# Patient Record
Sex: Female | Born: 1970
Health system: Southern US, Community
[De-identification: ages and names within clinical notes are randomized; demographics above are authoritative.]

## PROBLEM LIST (undated history)

## (undated) DIAGNOSIS — J45909 Unspecified asthma, uncomplicated: Secondary | ICD-10-CM

## (undated) DIAGNOSIS — E119 Type 2 diabetes mellitus without complications: Secondary | ICD-10-CM

## (undated) DIAGNOSIS — I5189 Other ill-defined heart diseases: Secondary | ICD-10-CM

## (undated) DIAGNOSIS — N200 Calculus of kidney: Secondary | ICD-10-CM

## (undated) DIAGNOSIS — B019 Varicella without complication: Secondary | ICD-10-CM

## (undated) DIAGNOSIS — N938 Other specified abnormal uterine and vaginal bleeding: Secondary | ICD-10-CM

## (undated) DIAGNOSIS — D649 Anemia, unspecified: Secondary | ICD-10-CM

## (undated) DIAGNOSIS — I10 Essential (primary) hypertension: Secondary | ICD-10-CM

## (undated) HISTORY — PX: TUBAL LIGATION: SHX77

## (undated) HISTORY — PX: CHOLECYSTECTOMY: SHX55

## (undated) HISTORY — PX: UMBILICAL HERNIA REPAIR: SHX196

## (undated) HISTORY — PX: HERNIA REPAIR: SHX51

## (undated) HISTORY — DX: Varicella without complication: B01.9

## (undated) HISTORY — PX: ABDOMINAL HYSTERECTOMY: SHX81

## (undated) HISTORY — DX: Unspecified asthma, uncomplicated: J45.909

## (undated) HISTORY — DX: Calculus of kidney: N20.0

---

## 1997-12-21 ENCOUNTER — Emergency Department (HOSPITAL_COMMUNITY): Admission: EM | Admit: 1997-12-21 | Discharge: 1997-12-21 | Payer: Self-pay | Admitting: Emergency Medicine

## 1998-05-14 ENCOUNTER — Encounter: Payer: Self-pay | Admitting: Emergency Medicine

## 1998-05-14 ENCOUNTER — Emergency Department (HOSPITAL_COMMUNITY): Admission: EM | Admit: 1998-05-14 | Discharge: 1998-05-14 | Payer: Self-pay | Admitting: Emergency Medicine

## 1998-05-28 ENCOUNTER — Emergency Department (HOSPITAL_COMMUNITY): Admission: EM | Admit: 1998-05-28 | Discharge: 1998-05-28 | Payer: Self-pay | Admitting: Emergency Medicine

## 1998-10-08 ENCOUNTER — Emergency Department (HOSPITAL_COMMUNITY): Admission: EM | Admit: 1998-10-08 | Discharge: 1998-10-08 | Payer: Self-pay | Admitting: Emergency Medicine

## 1998-10-08 ENCOUNTER — Encounter: Payer: Self-pay | Admitting: Emergency Medicine

## 1999-04-08 ENCOUNTER — Emergency Department (HOSPITAL_COMMUNITY): Admission: EM | Admit: 1999-04-08 | Discharge: 1999-04-08 | Payer: Self-pay | Admitting: Emergency Medicine

## 1999-05-08 ENCOUNTER — Inpatient Hospital Stay (HOSPITAL_COMMUNITY): Admission: AD | Admit: 1999-05-08 | Discharge: 1999-05-08 | Payer: Self-pay | Admitting: Obstetrics & Gynecology

## 1999-08-11 ENCOUNTER — Emergency Department (HOSPITAL_COMMUNITY): Admission: EM | Admit: 1999-08-11 | Discharge: 1999-08-12 | Payer: Self-pay | Admitting: Emergency Medicine

## 2000-03-18 ENCOUNTER — Emergency Department (HOSPITAL_COMMUNITY): Admission: EM | Admit: 2000-03-18 | Discharge: 2000-03-18 | Payer: Self-pay | Admitting: *Deleted

## 2000-11-11 ENCOUNTER — Emergency Department (HOSPITAL_COMMUNITY): Admission: EM | Admit: 2000-11-11 | Discharge: 2000-11-11 | Payer: Self-pay | Admitting: Emergency Medicine

## 2000-11-11 ENCOUNTER — Encounter: Payer: Self-pay | Admitting: Emergency Medicine

## 2001-05-02 ENCOUNTER — Emergency Department (HOSPITAL_COMMUNITY): Admission: EM | Admit: 2001-05-02 | Discharge: 2001-05-02 | Payer: Self-pay | Admitting: Emergency Medicine

## 2001-05-03 ENCOUNTER — Emergency Department (HOSPITAL_COMMUNITY): Admission: EM | Admit: 2001-05-03 | Discharge: 2001-05-03 | Payer: Self-pay | Admitting: Emergency Medicine

## 2001-05-03 ENCOUNTER — Ambulatory Visit (HOSPITAL_COMMUNITY): Admission: RE | Admit: 2001-05-03 | Discharge: 2001-05-03 | Payer: Self-pay | Admitting: Emergency Medicine

## 2001-05-03 ENCOUNTER — Encounter: Payer: Self-pay | Admitting: Emergency Medicine

## 2001-05-10 ENCOUNTER — Encounter: Admission: RE | Admit: 2001-05-10 | Discharge: 2001-05-10 | Payer: Self-pay | Admitting: *Deleted

## 2001-05-18 ENCOUNTER — Encounter: Payer: Self-pay | Admitting: Obstetrics and Gynecology

## 2001-05-18 ENCOUNTER — Inpatient Hospital Stay (HOSPITAL_COMMUNITY): Admission: AD | Admit: 2001-05-18 | Discharge: 2001-05-18 | Payer: Self-pay | Admitting: Obstetrics and Gynecology

## 2001-06-02 ENCOUNTER — Encounter: Admission: RE | Admit: 2001-06-02 | Discharge: 2001-06-02 | Payer: Self-pay | Admitting: Obstetrics and Gynecology

## 2002-03-29 ENCOUNTER — Encounter: Payer: Self-pay | Admitting: Emergency Medicine

## 2002-03-29 ENCOUNTER — Emergency Department (HOSPITAL_COMMUNITY): Admission: EM | Admit: 2002-03-29 | Discharge: 2002-03-29 | Payer: Self-pay | Admitting: Emergency Medicine

## 2002-07-10 ENCOUNTER — Encounter: Payer: Self-pay | Admitting: *Deleted

## 2002-07-10 ENCOUNTER — Emergency Department (HOSPITAL_COMMUNITY): Admission: EM | Admit: 2002-07-10 | Discharge: 2002-07-11 | Payer: Self-pay | Admitting: Emergency Medicine

## 2002-07-24 ENCOUNTER — Encounter: Admission: RE | Admit: 2002-07-24 | Discharge: 2002-07-24 | Payer: Self-pay | Admitting: Internal Medicine

## 2002-11-08 ENCOUNTER — Encounter: Admission: RE | Admit: 2002-11-08 | Discharge: 2002-11-08 | Payer: Self-pay | Admitting: Internal Medicine

## 2003-06-14 ENCOUNTER — Emergency Department (HOSPITAL_COMMUNITY): Admission: EM | Admit: 2003-06-14 | Discharge: 2003-06-14 | Payer: Self-pay | Admitting: Emergency Medicine

## 2003-08-23 ENCOUNTER — Emergency Department (HOSPITAL_COMMUNITY): Admission: EM | Admit: 2003-08-23 | Discharge: 2003-08-23 | Payer: Self-pay | Admitting: Emergency Medicine

## 2003-08-29 ENCOUNTER — Emergency Department (HOSPITAL_COMMUNITY): Admission: EM | Admit: 2003-08-29 | Discharge: 2003-08-29 | Payer: Self-pay | Admitting: Family Medicine

## 2003-12-20 ENCOUNTER — Emergency Department (HOSPITAL_COMMUNITY): Admission: EM | Admit: 2003-12-20 | Discharge: 2003-12-20 | Payer: Self-pay | Admitting: Emergency Medicine

## 2004-03-26 ENCOUNTER — Emergency Department (HOSPITAL_COMMUNITY): Admission: EM | Admit: 2004-03-26 | Discharge: 2004-03-26 | Payer: Self-pay | Admitting: Emergency Medicine

## 2004-03-31 ENCOUNTER — Emergency Department (HOSPITAL_COMMUNITY): Admission: EM | Admit: 2004-03-31 | Discharge: 2004-03-31 | Payer: Self-pay | Admitting: Emergency Medicine

## 2004-09-08 ENCOUNTER — Emergency Department (HOSPITAL_COMMUNITY): Admission: EM | Admit: 2004-09-08 | Discharge: 2004-09-08 | Payer: Self-pay | Admitting: Emergency Medicine

## 2004-09-08 ENCOUNTER — Emergency Department (HOSPITAL_COMMUNITY): Admission: EM | Admit: 2004-09-08 | Discharge: 2004-09-09 | Payer: Self-pay | Admitting: *Deleted

## 2005-11-04 IMAGING — CR DG CHEST 2V
2 series · 2 of 2 positions shown · non-contrast
Comparison: none

CLINICAL DATA: Chest pain, shortness of breath. 
 CHEST - TWO VIEW:
 PA and lateral views of the chest reveal the heart, pulmonary vascularity and lung fields to be within normal limits.  There is some slight irregularity of the right hemidiaphragm thought to represent eventration.

[view not recorded (1 of 2)]
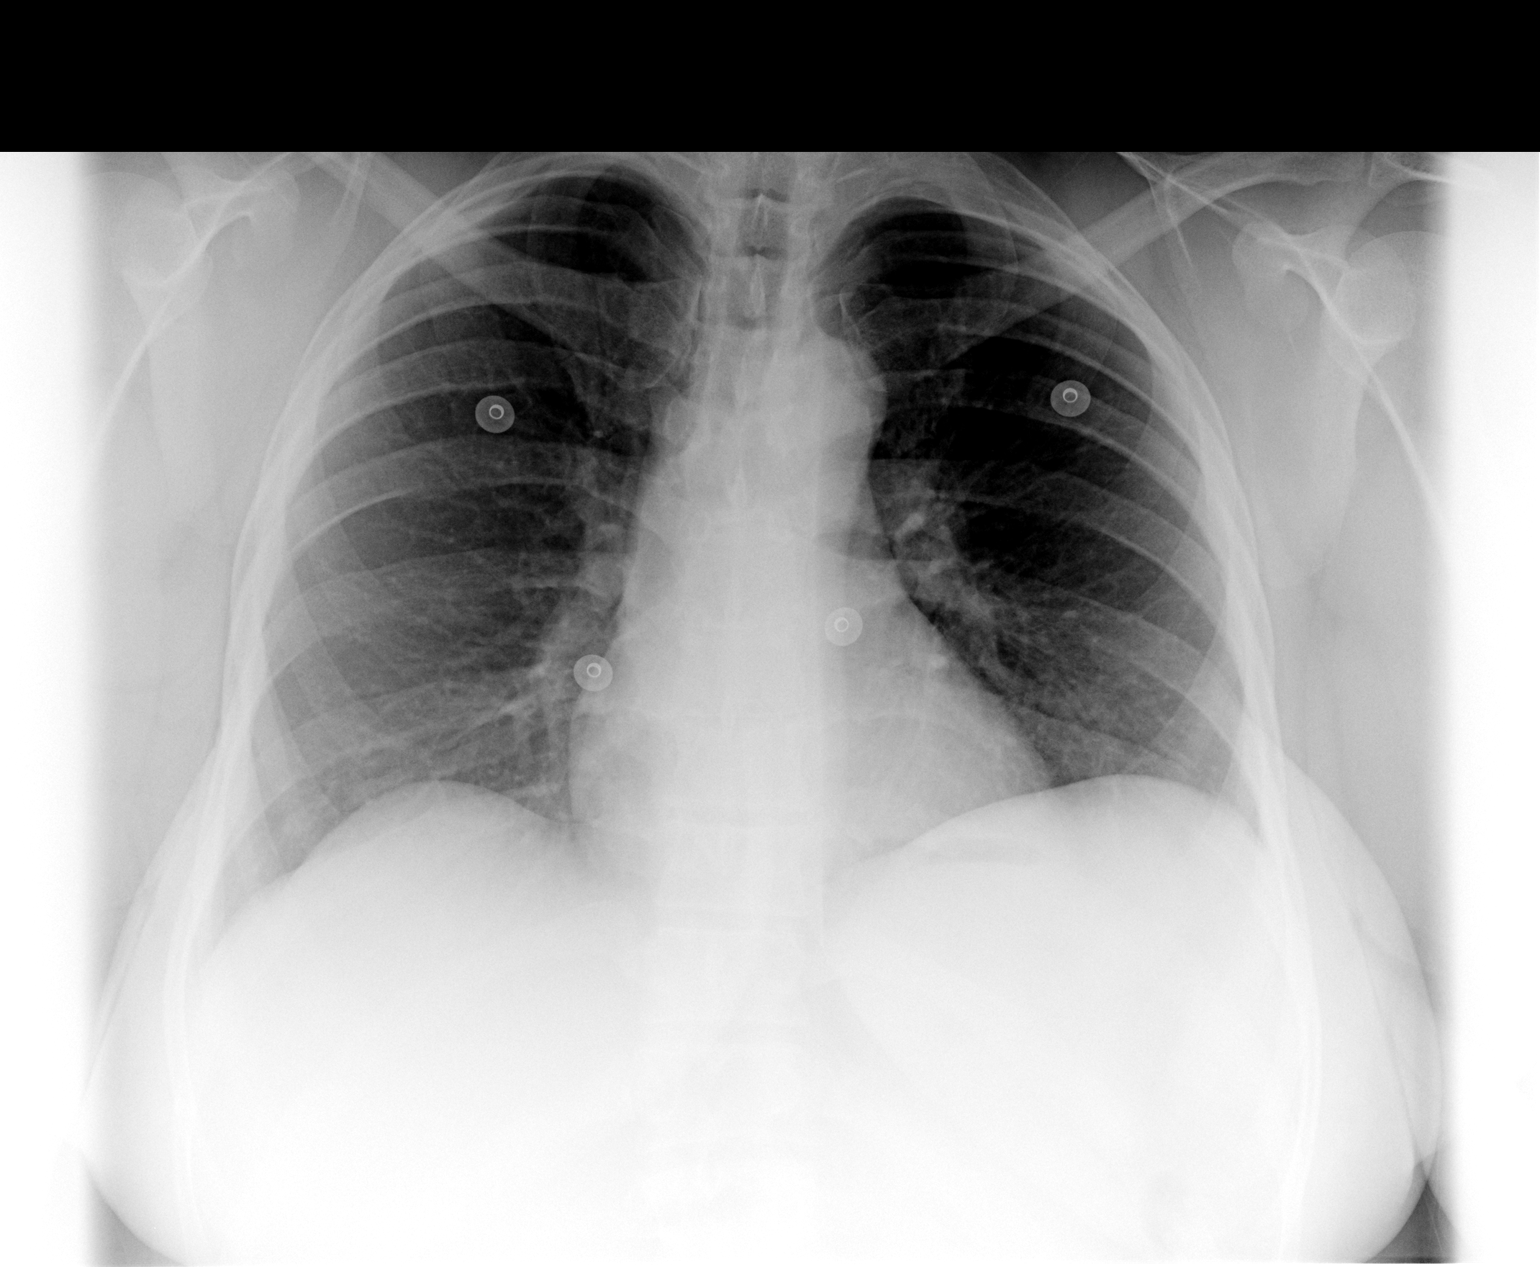

[view not recorded (2 of 2)]
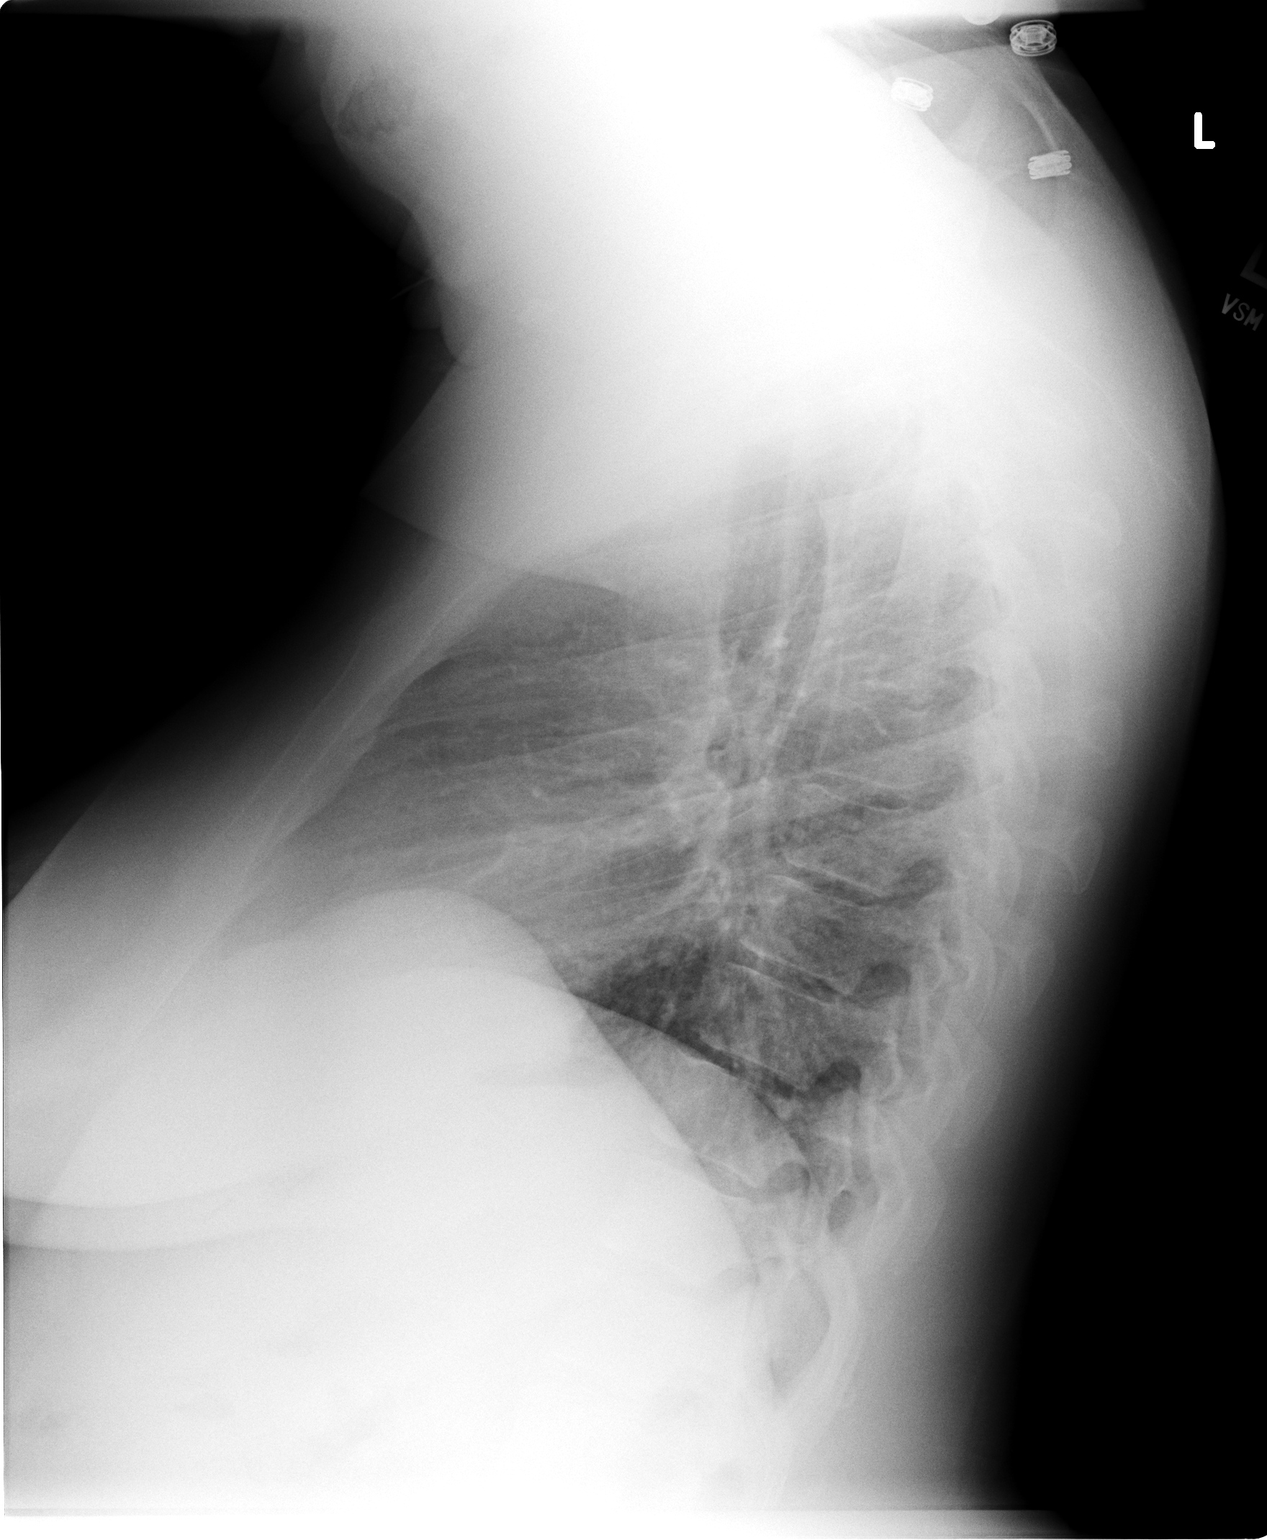

[2 of 2 positions shown; findings below may reference images not displayed]

IMPRESSION: Negative chest for active disease.

## 2005-12-08 ENCOUNTER — Emergency Department: Payer: Self-pay | Admitting: Emergency Medicine

## 2005-12-09 ENCOUNTER — Ambulatory Visit: Payer: Self-pay | Admitting: Emergency Medicine

## 2006-01-01 ENCOUNTER — Ambulatory Visit: Payer: Self-pay | Admitting: General Surgery

## 2006-12-29 ENCOUNTER — Emergency Department (HOSPITAL_COMMUNITY): Admission: EM | Admit: 2006-12-29 | Discharge: 2006-12-29 | Payer: Self-pay | Admitting: Emergency Medicine

## 2007-01-24 ENCOUNTER — Emergency Department (HOSPITAL_COMMUNITY): Admission: EM | Admit: 2007-01-24 | Discharge: 2007-01-24 | Payer: Self-pay | Admitting: Emergency Medicine

## 2007-01-25 ENCOUNTER — Inpatient Hospital Stay (HOSPITAL_COMMUNITY): Admission: AD | Admit: 2007-01-25 | Discharge: 2007-01-25 | Payer: Self-pay | Admitting: Obstetrics & Gynecology

## 2007-03-02 ENCOUNTER — Encounter (INDEPENDENT_AMBULATORY_CARE_PROVIDER_SITE_OTHER): Payer: Self-pay | Admitting: Gynecology

## 2007-03-02 ENCOUNTER — Other Ambulatory Visit: Admission: RE | Admit: 2007-03-02 | Discharge: 2007-03-02 | Payer: Self-pay | Admitting: Obstetrics and Gynecology

## 2007-03-02 ENCOUNTER — Ambulatory Visit: Payer: Self-pay | Admitting: Gynecology

## 2009-01-04 ENCOUNTER — Emergency Department (HOSPITAL_COMMUNITY): Admission: EM | Admit: 2009-01-04 | Discharge: 2009-01-04 | Payer: Self-pay | Admitting: Emergency Medicine

## 2009-06-20 ENCOUNTER — Emergency Department (HOSPITAL_COMMUNITY): Admission: EM | Admit: 2009-06-20 | Discharge: 2009-06-21 | Payer: Self-pay | Admitting: Emergency Medicine

## 2010-03-09 ENCOUNTER — Encounter: Payer: Self-pay | Admitting: *Deleted

## 2010-05-06 LAB — DIFFERENTIAL
Basophils Absolute: 0 10*3/uL (ref 0.0–0.1)
Basophils Relative: 1 % (ref 0–1)
Eosinophils Absolute: 0.2 10*3/uL (ref 0.0–0.7)
Eosinophils Relative: 4 % (ref 0–5)
Lymphocytes Relative: 39 % (ref 12–46)
Lymphs Abs: 1.9 10*3/uL (ref 0.7–4.0)
Monocytes Absolute: 0.3 10*3/uL (ref 0.1–1.0)
Monocytes Relative: 6 % (ref 3–12)
Neutro Abs: 2.5 10*3/uL (ref 1.7–7.7)
Neutrophils Relative %: 50 % (ref 43–77)

## 2010-05-06 LAB — COMPREHENSIVE METABOLIC PANEL
ALT: 33 U/L (ref 0–35)
AST: 26 U/L (ref 0–37)
Albumin: 3.8 g/dL (ref 3.5–5.2)
Alkaline Phosphatase: 90 U/L (ref 39–117)
BUN: 7 mg/dL (ref 6–23)
CO2: 24 mEq/L (ref 19–32)
Calcium: 10.9 mg/dL — ABNORMAL HIGH (ref 8.4–10.5)
Chloride: 108 mEq/L (ref 96–112)
Creatinine, Ser: 0.56 mg/dL (ref 0.4–1.2)
GFR calc Af Amer: >60 mL/min (ref >60)
GFR calc non Af Amer: >60 mL/min (ref >60)
Glucose, Bld: 94 mg/dL (ref 70–99)
Potassium: 4 mEq/L (ref 3.5–5.1)
Sodium: 135 mEq/L (ref 135–145)
Total Bilirubin: 0.5 mg/dL (ref 0.3–1.2)
Total Protein: 8.2 g/dL (ref 6.0–8.3)

## 2010-05-06 LAB — HEMOCCULT GUIAC POC 1CARD (OFFICE): Fecal Occult Bld: NEGATIVE

## 2010-05-06 LAB — URINALYSIS, ROUTINE W REFLEX MICROSCOPIC
Bilirubin Urine: NEGATIVE
Glucose, UA: NEGATIVE mg/dL
Hgb urine dipstick: NEGATIVE
Ketones, ur: NEGATIVE mg/dL
Nitrite: NEGATIVE
Protein, ur: NEGATIVE mg/dL
Specific Gravity, Urine: 1.021 (ref 1.005–1.030)
Urobilinogen, UA: 0.2 mg/dL (ref 0.0–1.0)
pH: 5.5 (ref 5.0–8.0)

## 2010-05-06 LAB — CBC
HCT: 28.9 % — ABNORMAL LOW (ref 36.0–46.0)
Hemoglobin: 9.4 g/dL — ABNORMAL LOW (ref 12.0–15.0)
MCHC: 32.4 g/dL (ref 30.0–36.0)
MCV: 72.2 fL — ABNORMAL LOW (ref 78.0–100.0)
Platelets: 391 10*3/uL (ref 150–400)
RBC: 4 MIL/uL (ref 3.87–5.11)
RDW: 18 % — ABNORMAL HIGH (ref 11.5–15.5)
WBC: 4.9 10*3/uL (ref 4.0–10.5)

## 2010-05-06 LAB — PREGNANCY, URINE: Preg Test, Ur: NEGATIVE

## 2010-05-06 LAB — LIPASE, BLOOD: Lipase: 21 U/L (ref 11–59)

## 2010-05-21 LAB — DIFFERENTIAL
Basophils Absolute: 0.1 10*3/uL (ref 0.0–0.1)
Basophils Relative: 3 % — ABNORMAL HIGH (ref 0–1)
Eosinophils Absolute: 0.1 10*3/uL (ref 0.0–0.7)
Eosinophils Relative: 4 % (ref 0–5)
Lymphocytes Relative: 42 % (ref 12–46)
Lymphs Abs: 1.5 10*3/uL (ref 0.7–4.0)
Monocytes Absolute: 0.2 10*3/uL (ref 0.1–1.0)
Monocytes Relative: 7 % (ref 3–12)
Neutro Abs: 1.7 10*3/uL (ref 1.7–7.7)
Neutrophils Relative %: 46 % (ref 43–77)

## 2010-05-21 LAB — POCT CARDIAC MARKERS
CKMB, poc: <1 ng/mL — ABNORMAL LOW (ref 1.0–8.0)
CKMB, poc: <1 ng/mL — ABNORMAL LOW (ref 1.0–8.0)
Myoglobin, poc: 58 ng/mL (ref 12–200)
Myoglobin, poc: 72 ng/mL (ref 12–200)
Troponin i, poc: <0.05 ng/mL (ref 0.00–0.09)
Troponin i, poc: <0.05 ng/mL (ref 0.00–0.09)

## 2010-05-21 LAB — COMPREHENSIVE METABOLIC PANEL
ALT: 21 U/L (ref 0–35)
AST: 25 U/L (ref 0–37)
Albumin: 3.6 g/dL (ref 3.5–5.2)
Alkaline Phosphatase: 87 U/L (ref 39–117)
BUN: 6 mg/dL (ref 6–23)
CO2: 23 mEq/L (ref 19–32)
Calcium: 10.2 mg/dL (ref 8.4–10.5)
Chloride: 107 mEq/L (ref 96–112)
Creatinine, Ser: 0.6 mg/dL (ref 0.4–1.2)
GFR calc Af Amer: >60 mL/min (ref >60)
GFR calc non Af Amer: >60 mL/min (ref >60)
Glucose, Bld: 119 mg/dL — ABNORMAL HIGH (ref 70–99)
Potassium: 3.8 mEq/L (ref 3.5–5.1)
Sodium: 134 mEq/L — ABNORMAL LOW (ref 135–145)
Total Bilirubin: 0.4 mg/dL (ref 0.3–1.2)
Total Protein: 7.7 g/dL (ref 6.0–8.3)

## 2010-05-21 LAB — CBC
HCT: 31.3 % — ABNORMAL LOW (ref 36.0–46.0)
Hemoglobin: 10.3 g/dL — ABNORMAL LOW (ref 12.0–15.0)
MCHC: 32.9 g/dL (ref 30.0–36.0)
MCV: 73.2 fL — ABNORMAL LOW (ref 78.0–100.0)
Platelets: 329 10*3/uL (ref 150–400)
RBC: 4.28 MIL/uL (ref 3.87–5.11)
RDW: 18.6 % — ABNORMAL HIGH (ref 11.5–15.5)
WBC: 3.6 10*3/uL — ABNORMAL LOW (ref 4.0–10.5)

## 2010-05-21 LAB — LIPASE, BLOOD: Lipase: 16 U/L (ref 11–59)

## 2010-05-21 LAB — D-DIMER, QUANTITATIVE: D-Dimer, Quant: 0.23 ug/mL-FEU (ref 0.00–0.48)

## 2010-07-01 NOTE — Group Therapy Note (Signed)
Carrie Neal NO.:  [id number]   MEDICAL RECORD NO.:  [id number]          PATIENT TYPE:  WOC   LOCATION:  [facility]                   FACILITY:  [facility]   PHYSICIAN:  Ginger Carne, MD DATE OF BIRTH:  [birth year]   DATE OF SERVICE:  03/02/2007                                  CLINIC NOTE   CHIEF COMPLAINT:  Prolonged vaginal bleeding.   HISTORY OF PRESENT ILLNESS:  This is a 40 year old female who has been  seen in the MAU on at least 2 occasions for dysfunctional uterine  bleeding.  She states she has had normal periods her entire life until  August, and then she skipped 2 months, then she bled all of November.  In December she was given some kind of little pill for bleeding and  then bled more, including with clots.  She was seen again in December in  the MAU for this bleeding, was found to have a hemoglobin of 7.5.  Transfusion was discussed, however, the patient was having some heart  palpitations so they held off on the transfusion and gave her IV fluids  which helped.  They also gave her some kind of oral contraceptives,  although the patient is not sure of the name of them.  The bleeding  eventually stopped a couple of weeks ago in January.  She still has some  heart racing on occasion.  The history is difficult to elicit as the  patient is unsure of what pills and exactly when and how many she took.  The patient denies having abnormal Pap smears in the past.   PAST MEDICAL HISTORY:  The patient has asthma checked.   FAMILY HISTORY:  Grandparents and cousins with diabetes.   GYN HISTORY:  Normal periods throughout her life until August of 2008.   SURGICAL HISTORY:  She had her bladder removed in 2007.  It is unclear  what exactly this means as no Foley is in place.   REVIEW OF SYSTEMS:  See HPI as well as positive for hot flashes,  positive for weight gain, headaches and dizzy spells.   PHYSICAL EXAMINATION:  Temp 97.4, pulse 102, blood  pressure 127/82,  weight is 255.5, respiratory rate 24.  GENERAL EXAM:  Not in acute distress.  ABDOMEN:  Soft, nontender.  EXTREMITIES:  No edema and 2+ pulses.  GENITOURINARY EXAM:  No lesions noted on the cervix.  No bleeding noted  at this time.  No masses palpated on bimanual exam.   PROCEDURE NOTE:  Endometrial biopsy was obtained.  Tenaculum was placed  at the 1 and 11 o'clock positions.  The endometrial __________ was  inserted without complications and a small sampling was obtained to be  sent for biopsy.  Dr. Mia Creek was present for this procedure.  A Pap  smear was also obtained prior to this endometrial biopsy.   ASSESSMENT AND PLAN:  A 40 year old female with dysfunctional uterine  bleeding.  1. Dysfunctional uterine bleeding:  The use of contraceptives have      somewhat clouded the picture; however, we will go ahead and get a  transvaginal ultrasound to evaluate the endometrial stripe.  We did      obtain an endometrial biopsy which we will send to pathology.  We      also obtained a Pap smear.  This could be perimenopausal bleeding;      however, we must rule out malignancy and other possible causes such      as fibroids which the ultrasound will pick up.  The patient will      followup in 2 to 3 weeks.  Will check a CBC and TSH.  2. Anemia:  We will check a CBC as the patient has had profound anemia      recently and she endures some symptoms that may be caused by      profound anemia.  Also obtain a TSH.      Johney [place], MD    ______________________________  Ginger Carne, MD    JT/MEDQ  D:  03/02/2007  T:  03/02/2007  Job:  [id number]

## 2010-07-04 NOTE — Consult Note (Signed)
. [facility]  Patient:    MADELYN, TLATELPA Visit Number: [id number] MRN: [id number]          Service Type: OBS Location: [facility] Attending Physician:  Amada Kingfisher. Proc. Date: 06/02/01 Admit Date:  05/18/2001 Discharge Date: 05/18/2001                            Consultation Report  HISTORY OF PRESENT ILLNESS: The patient is a 40 year old Gravida 6, Para 6, 0-0-6, who had an episode of amenorrhea with her last menstrual period being March 25, 2001. She had a sonogram and they thought that saw a gestational sac, however, now thought to be a small polyp. She was not having any symptoms. This was thought to be pregnancy. Beta HCG was negative and the patient had a spontaneous period but somewhat heavy on May 21, 2001. The patients main complaint has been upper abdominal bloating and swelling since January. She denies nausea, vomiting, diarrhea, or constipation but she says almost every day she feels lumps and swelling in the upper abdomen and it makes her very uncomfortable. Pelvic exam coupled with the sonogram is normal.  HOSPITAL COURSE: Probably irritable colon, spastic colitis. and the patient is being referred to internal medicine. In the interim, will give the patient some activated charcoal tablets to see if this may help alleviate the gas.  IMPRESSION: Abdominal bloating. Attending Physician:  Amada Kingfisher. DD:  06/02/01 TD:  06/03/01 Job: [id number]

## 2010-11-24 LAB — URINALYSIS, ROUTINE W REFLEX MICROSCOPIC
Glucose, UA: NEGATIVE
Leukocytes, UA: NEGATIVE
Specific Gravity, Urine: 1.035 — ABNORMAL HIGH
Urobilinogen, UA: 1

## 2010-11-24 LAB — CBC
HCT: 23.4 — ABNORMAL LOW
Hemoglobin: 7.6 — CL
MCHC: 31.9
RBC: 3.19 — ABNORMAL LOW
RDW: 19.1 — ABNORMAL HIGH
WBC: 5.1

## 2010-11-24 LAB — WET PREP, GENITAL
Clue Cells Wet Prep HPF POC: NONE SEEN
Trich, Wet Prep: NONE SEEN
Yeast Wet Prep HPF POC: NONE SEEN

## 2010-11-24 LAB — URINE MICROSCOPIC-ADD ON

## 2010-11-24 LAB — POCT PREGNANCY, URINE
Operator id: 294501
Preg Test, Ur: NEGATIVE

## 2010-11-25 LAB — BASIC METABOLIC PANEL
BUN: 3 — ABNORMAL LOW
CO2: 26
Glucose, Bld: 100 — ABNORMAL HIGH
Potassium: 3.6
Sodium: 137

## 2010-11-25 LAB — DIFFERENTIAL
Basophils Absolute: 0
Eosinophils Absolute: 0.1 — ABNORMAL LOW
Lymphs Abs: 1.1
Monocytes Absolute: 0.3
Neutro Abs: 2.3

## 2010-11-25 LAB — WET PREP, GENITAL
Clue Cells Wet Prep HPF POC: NONE SEEN
WBC, Wet Prep HPF POC: NONE SEEN

## 2010-11-25 LAB — CBC
HCT: 24.9 — ABNORMAL LOW
Hemoglobin: 8.3 — ABNORMAL LOW
MCHC: 33.3
MCV: 73.6 — ABNORMAL LOW
Platelets: 377
RDW: 20.9 — ABNORMAL HIGH

## 2010-11-25 LAB — RPR: RPR Ser Ql: NONREACTIVE

## 2011-06-15 ENCOUNTER — Emergency Department (HOSPITAL_COMMUNITY)
Admission: EM | Admit: 2011-06-15 | Discharge: 2011-06-15 | Disposition: A | Discharge status: Home / Self Care | Payer: Self-pay | Attending: Emergency Medicine | Admitting: Emergency Medicine

## 2011-06-15 ENCOUNTER — Encounter (HOSPITAL_COMMUNITY): Payer: Self-pay | Admitting: *Deleted

## 2011-06-15 DIAGNOSIS — Z9089 Acquired absence of other organs: Secondary | ICD-10-CM | POA: Insufficient documentation

## 2011-06-15 DIAGNOSIS — R109 Unspecified abdominal pain: Secondary | ICD-10-CM | POA: Insufficient documentation

## 2011-06-15 DIAGNOSIS — K2990 Gastroduodenitis, unspecified, without bleeding: Secondary | ICD-10-CM | POA: Insufficient documentation

## 2011-06-15 DIAGNOSIS — R10816 Epigastric abdominal tenderness: Secondary | ICD-10-CM | POA: Insufficient documentation

## 2011-06-15 DIAGNOSIS — K2970 Gastritis, unspecified, without bleeding: Secondary | ICD-10-CM | POA: Insufficient documentation

## 2011-06-15 HISTORY — DX: Essential (primary) hypertension: I10

## 2011-06-15 LAB — URINALYSIS, ROUTINE W REFLEX MICROSCOPIC
Bilirubin Urine: NEGATIVE
Glucose, UA: NEGATIVE mg/dL
Ketones, ur: NEGATIVE mg/dL
pH: 5.5 (ref 5.0–8.0)

## 2011-06-15 LAB — DIFFERENTIAL
Basophils Absolute: 0 10*3/uL (ref 0.0–0.1)
Basophils Relative: 0 % (ref 0–1)
Lymphocytes Relative: 45 % (ref 12–46)
Monocytes Relative: 6 % (ref 3–12)
Neutro Abs: 1.7 10*3/uL (ref 1.7–7.7)
Neutrophils Relative %: 47 % (ref 43–77)

## 2011-06-15 LAB — COMPREHENSIVE METABOLIC PANEL
AST: 16 U/L (ref 0–37)
Albumin: 3.6 g/dL (ref 3.5–5.2)
Alkaline Phosphatase: 100 U/L (ref 39–117)
BUN: 9 mg/dL (ref 6–23)
Chloride: 105 mEq/L (ref 96–112)
Potassium: 3.8 mEq/L (ref 3.5–5.1)
Total Bilirubin: 0.3 mg/dL (ref 0.3–1.2)

## 2011-06-15 LAB — CBC
Hemoglobin: 9.5 g/dL — ABNORMAL LOW (ref 12.0–15.0)
MCHC: 32 g/dL (ref 30.0–36.0)
RBC: 4 MIL/uL (ref 3.87–5.11)
WBC: 3.7 10*3/uL — ABNORMAL LOW (ref 4.0–10.5)

## 2011-06-15 MED ORDER — GI COCKTAIL ~~LOC~~
30.0000 mL | Freq: Once | ORAL | Status: AC
  Administered 2011-06-15: 30 mL via ORAL
  Filled 2011-06-15: qty 30

## 2011-06-15 NOTE — ED Provider Notes (Signed)
History     CSN: [id number]  Arrival date & time 06/15/11  8469   First MD Initiated Contact with Patient 06/15/11 1129      Chief Complaint  Patient presents with  . Abdominal Pain    (Consider location/radiation/quality/duration/timing/severity/associated sxs/prior treatment) HPI Comments: History of Lap Chole about 3 years ago.  Patient is a 41 y.o. female presenting with abdominal pain. The history is provided by the patient.  Abdominal Pain The primary symptoms of the illness include abdominal pain and nausea. The primary symptoms of the illness do not include fever, fatigue, vomiting, diarrhea or dysuria. Episode onset: 3 weeks ago. The onset of the illness was gradual. The problem has been gradually worsening.  The illness is associated with eating (drinking). The patient states that she believes she is currently not pregnant. The patient has not had a change in bowel habit. Symptoms associated with the illness do not include chills.    Past Medical History  Diagnosis Date  . Hypertension     Past Surgical History  Procedure Date  . Cholecystectomy   . Tubal ligation   . Umbilical hernia repair     as child    No family history on file.  History  Substance Use Topics  . Smoking status: Never Smoker   . Smokeless tobacco: Not on file  . Alcohol Use: No    OB History    Grav Para Term Preterm Abortions TAB SAB Ect Mult Living                  Review of Systems  Constitutional: Negative for fever, chills and fatigue.  Gastrointestinal: Positive for nausea and abdominal pain. Negative for vomiting and diarrhea.  Genitourinary: Negative for dysuria.  All other systems reviewed and are negative.    Allergies  Review of patient's allergies indicates no known allergies.  Home Medications   Current Outpatient Rx  Name Route Sig Dispense Refill  . IBUPROFEN 200 MG PO TABS Oral Take 400-800 mg by mouth every 6 (six) hours as needed. For pain    .  LISINOPRIL 10 MG PO TABS Oral Take 10 mg by mouth daily.      BP 124/83  Pulse 92  Temp(Src) 97.5 F (36.4 C) (Oral)  Resp 16  SpO2 96%  Physical Exam  Nursing note and vitals reviewed. Constitutional: She is oriented to person, place, and time. She appears well-developed and well-nourished. No distress.  HENT:  Head: Normocephalic and atraumatic.  Neck: Normal range of motion. Neck supple.  Cardiovascular: Normal rate and regular rhythm.  Exam reveals no gallop and no friction rub.   No murmur heard. Pulmonary/Chest: Effort normal and breath sounds normal. No respiratory distress. She has no wheezes.  Abdominal: Soft. Bowel sounds are normal. She exhibits no distension.       There is mild ttp in the epigastric region.  No rebound or guarding.    Musculoskeletal: Normal range of motion.  Neurological: She is alert and oriented to person, place, and time.  Skin: Skin is warm and dry. She is not diaphoretic.    ED Course  Procedures (including critical care time)   Labs Reviewed  CBC  DIFFERENTIAL  COMPREHENSIVE METABOLIC PANEL  LIPASE, BLOOD  URINALYSIS, ROUTINE W REFLEX MICROSCOPIC  PREGNANCY, URINE   No results found.   No diagnosis found.    MDM  The labs look okay.  I suspect this is gastritis as she is post-cholecystectomy.  Will treat with prilosec,  to return prn.          Geoffery Lyons, MD 06/15/11 1250

## 2011-06-15 NOTE — ED Notes (Signed)
Pt c/o lower abdominal pain x 3 weeks, also lower back pain. Denying any urinary symptoms. Last bowel movement today. Denying any n/v/d. Speaking in clear, concise sentences. Ambulated to restroom without difficulty.

## 2011-06-15 NOTE — Discharge Instructions (Signed)
Prilosec 20 mg twice daily for the next 2 weeks, once daily thereafter.  Multivitamin with iron once daily.     Abdominal Pain Abdominal pain can be caused by many things. Your caregiver decides the seriousness of your pain by an examination and possibly blood tests and X-rays. Many cases can be observed and treated at home. Most abdominal pain is not caused by a disease and will probably improve without treatment. However, in many cases, more time must pass before a clear cause of the pain can be found. Before that point, it may not be known if you need more testing, or if hospitalization or surgery is needed. HOME CARE INSTRUCTIONS   Do not take laxatives unless directed by your caregiver.   Take pain medicine only as directed by your caregiver.   Only take over-the-counter or prescription medicines for pain, discomfort, or fever as directed by your caregiver.   Try a clear liquid diet (broth, tea, or water) for as long as directed by your caregiver. Slowly move to a bland diet as tolerated.  SEEK IMMEDIATE MEDICAL CARE IF:   The pain does not go away.   You have a fever.   You keep throwing up (vomiting).   The pain is felt only in portions of the abdomen. Pain in the right side could possibly be appendicitis. In an adult, pain in the left lower portion of the abdomen could be colitis or diverticulitis.   You pass bloody or black tarry stools.  MAKE SURE YOU:   Understand these instructions.   Will watch your condition.   Will get help right away if you are not doing well or get worse.  Document Released: 11/12/2004 Document Revised: 01/22/2011 Document Reviewed: 09/21/2007 [facility] Patient Information 2012 [place], [place].

## 2011-06-15 NOTE — ED Notes (Signed)
Pt c/o abd pain x 3 weeks.  Pain increases after eating, epigastric and R flank pain.

## 2012-02-20 ENCOUNTER — Emergency Department (HOSPITAL_COMMUNITY): Payer: Self-pay

## 2012-02-20 ENCOUNTER — Emergency Department (HOSPITAL_COMMUNITY)
Admission: EM | Admit: 2012-02-20 | Discharge: 2012-02-20 | Disposition: A | Discharge status: Home / Self Care | Payer: Self-pay | Attending: Emergency Medicine | Admitting: Emergency Medicine

## 2012-02-20 ENCOUNTER — Encounter (HOSPITAL_COMMUNITY): Payer: Self-pay | Admitting: *Deleted

## 2012-02-20 DIAGNOSIS — M2550 Pain in unspecified joint: Secondary | ICD-10-CM | POA: Insufficient documentation

## 2012-02-20 DIAGNOSIS — M722 Plantar fascial fibromatosis: Secondary | ICD-10-CM | POA: Insufficient documentation

## 2012-02-20 DIAGNOSIS — J3489 Other specified disorders of nose and nasal sinuses: Secondary | ICD-10-CM | POA: Insufficient documentation

## 2012-02-20 DIAGNOSIS — I10 Essential (primary) hypertension: Secondary | ICD-10-CM | POA: Insufficient documentation

## 2012-02-20 DIAGNOSIS — J069 Acute upper respiratory infection, unspecified: Secondary | ICD-10-CM | POA: Insufficient documentation

## 2012-02-20 MED ORDER — IBUPROFEN 400 MG PO TABS
600.0000 mg | ORAL_TABLET | Freq: Once | ORAL | Status: AC
  Administered 2012-02-20: 600 mg via ORAL
  Filled 2012-02-20: qty 1

## 2012-02-20 MED ORDER — FEXOFENADINE-PSEUDOEPHED ER 60-120 MG PO TB12: 1.0000 | ORAL_TABLET | Freq: Two times a day (BID) | ORAL | Status: DC

## 2012-02-20 MED ORDER — NAPROXEN 500 MG PO TABS: 500.0000 mg | ORAL_TABLET | Freq: Two times a day (BID) | ORAL | Status: DC | PRN

## 2012-02-20 MED ORDER — OXYCODONE-ACETAMINOPHEN 5-325 MG PO TABS
2.0000 | ORAL_TABLET | Freq: Once | ORAL | Status: AC
  Administered 2012-02-20: 2 via ORAL
  Filled 2012-02-20: qty 2

## 2012-02-20 NOTE — ED Provider Notes (Signed)
History   This chart was scribed for Raeford Razor, MD by Sofie Rower, ED Scribe. The patient was seen in room TR11C/TR11C and the patient's care was started at 5:40PM.     CSN: [id number]  Arrival date & time 02/20/12  1533   First MD Initiated Contact with Patient 02/20/12 1740      Chief Complaint  Patient presents with  . Foot Pain  . Nasal Congestion    (Consider location/radiation/quality/duration/timing/severity/associated sxs/prior treatment) Patient is a 42 y.o. female presenting with lower extremity pain. The history is provided by the patient. No language interpreter was used.  Foot Pain This is a new problem. The current episode started more than 1 week ago. The problem occurs constantly. The problem has been gradually worsening. The symptoms are aggravated by walking. Nothing relieves the symptoms. Treatments tried: ibprofen. The treatment provided no relief.    Carrie Neal is a 42 y.o. female , with a hx of hypertension, who presents to the Emergency Department complaining of gradual, progressively worsening, foot pain located at the right foot, onset two weeks ago. The pt reports she has been experiencing a painful sensation located at the bottom of her right foot, while walking, over the past two weeks. The foot pain started off as a mild nuisance , however, it has become increasingly severe, promoting the pt's concern and desire to seek medical evaluation at [facility] this evening. The pt has taken ibuprofen which does not provide relief of the foot pain. Modifying factors include weight bearing and ambulation which intensifies the foot pain. The pt also presents to the Emergency Department Complaining of moderate, progressively worsening nasal congestion, onset today (02/20/12). Associated symptoms include rhinorrhea. The pt also believes she may be coming down with a sinus infection at present time.   The pt denies experiencing any injury which may have prompted her foot pain.    The pt does not smoke or drink alcohol.   PCP is Dr. Sherlyn Lick.    Past Medical History  Diagnosis Date  . Hypertension     Past Surgical History  Procedure Date  . Cholecystectomy   . Tubal ligation   . Umbilical hernia repair     as child    No family history on file.  History  Substance Use Topics  . Smoking status: Never Smoker   . Smokeless tobacco: Not on file  . Alcohol Use: No    OB History    Grav Para Term Preterm Abortions TAB SAB Ect Mult Living                  Review of Systems  HENT: Positive for rhinorrhea and sinus pressure.   Musculoskeletal: Positive for arthralgias.  All other systems reviewed and are negative.    Allergies  Review of patient's allergies indicates no known allergies.  Home Medications  No current outpatient prescriptions on file.  BP 139/80  Pulse 72  Temp 98.6 F (37 C) (Oral)  SpO2 95%  Physical Exam  Nursing note and vitals reviewed. Constitutional: She appears well-developed and well-nourished. No distress.  HENT:  Head: Normocephalic and atraumatic.  Eyes: Conjunctivae normal are normal. Right eye exhibits no discharge. Left eye exhibits no discharge.  Neck: Neck supple.  Cardiovascular: Normal rate, regular rhythm and normal heart sounds.  Exam reveals no gallop and no friction rub.   No murmur heard. Pulmonary/Chest: Effort normal and breath sounds normal. No respiratory distress.  Abdominal: Soft. She exhibits no distension. There is  no tenderness.  Musculoskeletal: She exhibits no edema and no tenderness.       Left foot grossly normal in appearance. Diffuse tenderness along the plantar surface of the right foot. From calcaneus to ball of right foot. No concerning skin lesions. Foot warm and well perfused.   Neurological: She is alert.  Skin: Skin is warm and dry.  Psychiatric: She has a normal mood and affect. Her behavior is normal. Thought content normal.    ED Course  Procedures (including  critical care time)  DIAGNOSTIC STUDIES: Oxygen Saturation is 95% on room air, normal by my interpretation.    COORDINATION OF CARE:   5:50 PM- Treatment plan discussed with patient. Pt agrees with treatment.     Labs Reviewed - No data to display Dg Foot Complete Right  02/20/2012  *RADIOLOGY REPORT*  Clinical Data: Right heel pain for 3 weeks  RIGHT FOOT COMPLETE - 3+ VIEW  Comparison: None.  Findings: No fracture or dislocation.  No soft tissue abnormality. No radiopaque foreign body.  Minimal plantar calcaneal spurring.  IMPRESSION: No acute osseous abnormality.   Original Report Authenticated By: Christiana Pellant, M.D.      1. Plantar fasciitis   2. URI (upper respiratory infection)       MDM  42 year old female with atraumatic foot pain. Symptoms and exam consistent with plantar fasciitis. Plan systematic treatment. Or the referral for possible steroid injection. No evidence of infection. Doubt DVT.     I personally preformed the services scribed in my presence. The recorded information has been reviewed is accurate. Raeford Razor, MD.    Raeford Razor, MD 02/23/12 [phone]

## 2012-02-20 NOTE — ED Notes (Signed)
Bottom of rt. Foot painful; difficult to walk. Also, sinus infection. Green stuff coming out of nose. Ears pop when blowing nose.

## 2012-10-22 ENCOUNTER — Emergency Department (HOSPITAL_COMMUNITY): Payer: Self-pay

## 2012-10-22 ENCOUNTER — Emergency Department (HOSPITAL_COMMUNITY)
Admission: EM | Admit: 2012-10-22 | Discharge: 2012-10-22 | Disposition: A | Discharge status: Home / Self Care | Payer: Self-pay | Attending: Emergency Medicine | Admitting: Emergency Medicine

## 2012-10-22 ENCOUNTER — Encounter (HOSPITAL_COMMUNITY): Payer: Self-pay | Admitting: Emergency Medicine

## 2012-10-22 DIAGNOSIS — R071 Chest pain on breathing: Secondary | ICD-10-CM | POA: Insufficient documentation

## 2012-10-22 DIAGNOSIS — R0789 Other chest pain: Secondary | ICD-10-CM

## 2012-10-22 DIAGNOSIS — Z79899 Other long term (current) drug therapy: Secondary | ICD-10-CM | POA: Insufficient documentation

## 2012-10-22 DIAGNOSIS — I10 Essential (primary) hypertension: Secondary | ICD-10-CM | POA: Insufficient documentation

## 2012-10-22 MED ORDER — ACETAMINOPHEN 325 MG PO TABS
650.0000 mg | ORAL_TABLET | Freq: Once | ORAL | Status: DC
  Filled 2012-10-22: qty 2

## 2012-10-22 MED ORDER — KETOROLAC TROMETHAMINE 60 MG/2ML IM SOLN
60.0000 mg | Freq: Once | INTRAMUSCULAR | Status: AC
  Administered 2012-10-22: 60 mg via INTRAMUSCULAR
  Filled 2012-10-22: qty 2

## 2012-10-22 NOTE — ED Notes (Signed)
States started having sharp, throbbing pain over "knot" in left ribs-- nickel sized lump felt on lower rib cage left side

## 2012-10-22 NOTE — ED Provider Notes (Addendum)
CSN: [id number]     Arrival date & time 10/22/12  0810 History   First MD Initiated Contact with Patient 10/22/12 [phone]     Chief Complaint  Patient presents with  . knot on ribs    (Consider location/radiation/quality/duration/timing/severity/associated sxs/prior Treatment) HPI  42 year old female who presents today complaining of some left lateral chest wall pain that began yesterday. She denies any trauma. It is sharp and throbbing in nature. It waxes and wanes. She states it is worse with palpation and deep breaths. She denies dyspnea, cough, central chest pain, abdominal pain, nausea, or vomiting. She denies any injury or heavy lifting. She has not had similar events in the past. She has not done anything that has helped it. She states that she has not tried any medicines including ibuprofen and a sure that ibuprofen did not help it.  Past Medical History  Diagnosis Date  . Hypertension    Past Surgical History  Procedure Laterality Date  . Cholecystectomy    . Tubal ligation    . Umbilical hernia repair      as child   History reviewed. No pertinent family history. History  Substance Use Topics  . Smoking status: Never Smoker   . Smokeless tobacco: Not on file  . Alcohol Use: No   OB History   Grav Para Term Preterm Abortions TAB SAB Ect Mult Living                 Review of Systems  All other systems reviewed and are negative.    Allergies  Codeine  Home Medications   Current Outpatient Rx  Name  Route  Sig  Dispense  Refill  . fexofenadine-pseudoephedrine (ALLEGRA-D) 60-120 MG per tablet   Oral   Take 1 tablet by mouth daily.         [name] Kitchen ibuprofen (ADVIL,MOTRIN) 200 MG tablet   Oral   Take 600 mg by mouth every 6 (six) hours as needed for pain.          BP 133/53  Pulse 87  Temp(Src) 97.3 F (36.3 C) (Oral)  Resp 18  SpO2 98% Physical Exam  Nursing note and vitals reviewed. Constitutional: She is oriented to person, place, and time. She appears  well-developed and well-nourished.  Obese  HENT:  Head: Normocephalic and atraumatic.  Right Ear: External ear normal.  Left Ear: External ear normal.  Mouth/Throat: Oropharynx is clear and moist.  Eyes: Conjunctivae and EOM are normal. Pupils are equal, round, and reactive to light.  Neck: Normal range of motion. Neck supple.  Cardiovascular: Normal rate, regular rhythm, normal heart sounds and intact distal pulses.   Pulmonary/Chest: Effort normal and breath sounds normal. She exhibits tenderness.    Abdominal: Soft. Bowel sounds are normal.  No abdominal tenderness even with deep palpation in the left upper quadrant.  Musculoskeletal: Normal range of motion. She exhibits no edema and no tenderness.  Neurological: She is alert and oriented to person, place, and time. She has normal reflexes.  Skin: Skin is warm and dry.  Psychiatric: She has a normal mood and affect. Her behavior is normal. Judgment and thought content normal.    ED Course  Procedures (including critical care time) Labs Review Labs Reviewed - No data to display Imaging Review Dg Ribs Unilateral W/chest Left  10/22/2012   *RADIOLOGY REPORT*  Clinical Data: Left chest wall pain  LEFT RIBS AND CHEST - 3+ VIEW  Comparison: Prior chest x-Markela Wee 01/04/2009  Findings: The lungs are  clear.  Normal cardiac and mediastinal contours.  No pleural effusion or pneumothorax.  Surgical clips the right upper quadrant suggest prior cholecystectomy.  Visualized bowel gas pattern is unremarkable. No evidence of acute rib fracture.  No acute osseous abnormality.  IMPRESSION:  1.  No acute cardiopulmonary disease. 2.  No rib fracture identified.   Original Report Authenticated By: Malachy Moan, M.D.    MDM  No diagnosis found. This is a 42 year old female who appears to have chest wall pain although there is not appear to been a specific trigger. There is no evidence from history or physical exam that there is coronary artery disease  with this is inconsistent in anyway with coronary artery disease, pulmonary embolism, or abdominal pathology. She's given a shot of Toradol here and is advised to use nonsteroidals for pain. Chest x-Atiba Kimberlin with left rib detail does not show any rib or thoracic abnormality noted.  Hilario Quarry, MD 10/22/12 1005  Hilario Quarry, MD 10/22/12 1006

## 2012-10-22 NOTE — ED Notes (Signed)
Knot felt under left breast on lower rib cage. Painful on palpation

## 2013-12-17 DIAGNOSIS — N938 Other specified abnormal uterine and vaginal bleeding: Secondary | ICD-10-CM

## 2013-12-17 HISTORY — DX: Other specified abnormal uterine and vaginal bleeding: N93.8

## 2014-03-01 ENCOUNTER — Encounter (HOSPITAL_COMMUNITY): Payer: Self-pay | Admitting: Emergency Medicine

## 2014-03-01 ENCOUNTER — Emergency Department (HOSPITAL_COMMUNITY)
Admission: EM | Admit: 2014-03-01 | Discharge: 2014-03-01 | Disposition: A | Discharge status: Home / Self Care | Payer: BLUE CROSS/BLUE SHIELD | Attending: Emergency Medicine | Admitting: Emergency Medicine

## 2014-03-01 DIAGNOSIS — N920 Excessive and frequent menstruation with regular cycle: Secondary | ICD-10-CM | POA: Insufficient documentation

## 2014-03-01 DIAGNOSIS — I10 Essential (primary) hypertension: Secondary | ICD-10-CM | POA: Diagnosis not present

## 2014-03-01 DIAGNOSIS — D500 Iron deficiency anemia secondary to blood loss (chronic): Secondary | ICD-10-CM | POA: Diagnosis not present

## 2014-03-01 DIAGNOSIS — N921 Excessive and frequent menstruation with irregular cycle: Secondary | ICD-10-CM

## 2014-03-01 LAB — WET PREP, GENITAL
CLUE CELLS WET PREP: NONE SEEN
TRICH WET PREP: NONE SEEN
YEAST WET PREP: NONE SEEN

## 2014-03-01 LAB — CBC WITH DIFFERENTIAL/PLATELET
BASOS PCT: 0 % (ref 0–1)
Basophils Absolute: 0 10*3/uL (ref 0.0–0.1)
EOS ABS: 0.2 10*3/uL (ref 0.0–0.7)
EOS PCT: 3 % (ref 0–5)
HCT: 28.4 % — ABNORMAL LOW (ref 36.0–46.0)
Hemoglobin: 8.6 g/dL — ABNORMAL LOW (ref 12.0–15.0)
Lymphocytes Relative: 28 % (ref 12–46)
Lymphs Abs: 1.7 10*3/uL (ref 0.7–4.0)
MCH: 21.4 pg — ABNORMAL LOW (ref 26.0–34.0)
MCHC: 30.3 g/dL (ref 30.0–36.0)
MCV: 70.8 fL — AB (ref 78.0–100.0)
Monocytes Absolute: 0.4 10*3/uL (ref 0.1–1.0)
Monocytes Relative: 6 % (ref 3–12)
NEUTROS ABS: 3.9 10*3/uL (ref 1.7–7.7)
Neutrophils Relative %: 63 % (ref 43–77)
PLATELETS: 396 10*3/uL (ref 150–400)
RBC: 4.01 MIL/uL (ref 3.87–5.11)
RDW: 18.1 % — AB (ref 11.5–15.5)
WBC: 6.2 10*3/uL (ref 4.0–10.5)

## 2014-03-01 LAB — COMPREHENSIVE METABOLIC PANEL
ALBUMIN: 3.6 g/dL (ref 3.5–5.2)
ALK PHOS: 84 U/L (ref 39–117)
ALT: 23 U/L (ref 0–35)
AST: 24 U/L (ref 0–37)
Anion gap: 3 — ABNORMAL LOW (ref 5–15)
BUN: 5 mg/dL — ABNORMAL LOW (ref 6–23)
CO2: 29 mmol/L (ref 19–32)
CREATININE: 0.59 mg/dL (ref 0.50–1.10)
Calcium: 8.9 mg/dL (ref 8.4–10.5)
Chloride: 106 mEq/L (ref 96–112)
GFR calc non Af Amer: >90 mL/min (ref >90)
GLUCOSE: 131 mg/dL — AB (ref 70–99)
Potassium: 3.6 mmol/L (ref 3.5–5.1)
SODIUM: 138 mmol/L (ref 135–145)
TOTAL PROTEIN: 7.6 g/dL (ref 6.0–8.3)
Total Bilirubin: 0.3 mg/dL (ref 0.3–1.2)

## 2014-03-01 LAB — I-STAT BETA HCG BLOOD, ED (MC, WL, AP ONLY)

## 2014-03-01 LAB — SAMPLE TO BLOOD BANK

## 2014-03-01 MED ORDER — MEGESTROL ACETATE 40 MG PO TABS: ORAL_TABLET | ORAL | Status: DC

## 2014-03-01 MED ORDER — FERROUS SULFATE 325 (65 FE) MG PO TABS: 325.0000 mg | ORAL_TABLET | Freq: Three times a day (TID) | ORAL | Status: DC

## 2014-03-01 NOTE — ED Notes (Signed)
Pt c/o heavy vaginal bleeding x 2 weeks; pt sts some gen weakness and SOB with exertion and thinks that her hgb might be low

## 2014-03-01 NOTE — Discharge Instructions (Signed)
Please follow with your primary care doctor in the next 2 days for a check-up. They must obtain records for further management.   Do not hesitate to return to the Emergency Department for any new, worsening or concerning symptoms.    Abnormal Uterine Bleeding Abnormal uterine bleeding means bleeding from the vagina that is not your normal menstrual period. This can be:  Bleeding or spotting between periods.  Bleeding after sex (sexual intercourse).  Bleeding that is heavier or more than normal.  Periods that last longer than usual.  Bleeding after menopause. There are many problems that may cause this. Treatment will depend on the cause of the bleeding. Any kind of bleeding that is not normal should be reviewed by your doctor.  HOME CARE Watch your condition for any changes. These actions may lessen any discomfort you are having:  Do not use tampons or douches as told by your doctor.  Change your pads often. You should get regular pelvic exams and Pap tests. Keep all appointments for tests as told by your doctor. GET HELP IF:  You are bleeding for more than 1 week.  You feel dizzy at times. GET HELP RIGHT AWAY IF:   You pass out.  You have to change pads every 15 to 30 minutes.  You have belly pain.  You have a fever.  You become sweaty or weak.  You are passing large blood clots from the vagina.  You feel sick to your stomach (nauseous) and throw up (vomit). MAKE SURE YOU:  Understand these instructions.  Will watch your condition.  Will get help right away if you are not doing well or get worse. Document Released: 11/30/2008 Document Revised: 02/07/2013 Document Reviewed: 09/01/2012 [facility] Patient Information 2015 [place], [place]. This information is not intended to replace advice given to you by your health care provider. Make sure you discuss any questions you have with your health care provider.

## 2014-03-01 NOTE — ED Provider Notes (Signed)
CSN: [id number]     Arrival date & time 03/01/14  1346 History   First MD Initiated Contact with Patient 03/01/14 1530     Chief Complaint  Patient presents with  . Vaginal Bleeding     (Consider location/radiation/quality/duration/timing/severity/associated sxs/prior Treatment) HPI  Carrie Neal is a 44 y.o. female complaining of heavy menses which she has had for 2 weeks. States her cycles are regular. Cannot remember when the last. As before this one. She has 4 out of 10 midline lower abdominal pain which was alleviated by Motrin which she had this morning. She has used approximately 90 pads in the last 2 weeks. States there is passage of clots, dates that she feels lightheaded, short of breath more so than usual worsening over the course of 2 weeks. She denies any chest pain, palpitations, syncope, headache, cough. Patient has no OB/GYN, recently established primary care.   Past Medical History  Diagnosis Date  . Hypertension    Past Surgical History  Procedure Laterality Date  . Cholecystectomy    . Tubal ligation    . Umbilical hernia repair      as child   History reviewed. No pertinent family history. History  Substance Use Topics  . Smoking status: Never Smoker   . Smokeless tobacco: Not on file  . Alcohol Use: No   OB History    No data available     Review of Systems  10 systems reviewed and found to be negative, except as noted in the HPI.   Allergies  Codeine  Home Medications   Prior to Admission medications   Medication Sig Start Date End Date Taking? Authorizing Provider  ibuprofen (ADVIL,MOTRIN) 200 MG tablet Take 600 mg by mouth every 6 (six) hours as needed for pain.   Yes Historical Provider, MD  ferrous sulfate 325 (65 FE) MG tablet Take 1 tablet (325 mg total) by mouth 3 (three) times daily with meals. 03/01/14   Nichollas Perusse, PA-C  megestrol (MEGACE) 40 MG tablet Take one tab orally 3 times daily until bleeding begins to slow, then switch to  2 times daily, change to 1 time daily until you are evaluated by OB/GYN at [facility] 03/01/14   Geneal Huebert, PA-C   BP 115/80 mmHg  Pulse 89  Temp(Src) 98.5 F (36.9 C) (Oral)  Resp 18  Ht 5\' 7"  (1.702 m)  Wt 273 lb (123.832 kg)  BMI 42.75 kg/m2  SpO2 97% Physical Exam  Constitutional: She is oriented to person, place, and time. She appears well-developed and well-nourished. No distress.  Morbidly obese  HENT:  Head: Normocephalic and atraumatic.  Mouth/Throat: Oropharynx is clear and moist.  Eyes: Conjunctivae and EOM are normal. Pupils are equal, round, and reactive to light.  Neck: Normal range of motion.  Cardiovascular: Normal rate, regular rhythm and intact distal pulses.   Pulmonary/Chest: Effort normal and breath sounds normal. No stridor. No respiratory distress. She has no wheezes. She has no rales.  Abdominal: Soft. Bowel sounds are normal. She exhibits no distension and no mass. There is no tenderness. There is no rebound and no guarding.  Genitourinary:  Pelvic exam a chaperoned by nurse Carolyne Fiscal: No rashes or lesions, there is dark blood pooled in the posterior fourchette, no cervical motion or adnexal tenderness.  Musculoskeletal: Normal range of motion.  Neurological: She is alert and oriented to person, place, and time.  Psychiatric: She has a normal mood and affect.  Nursing note and vitals reviewed.  ED Course  Procedures (including critical care time) Labs Review Labs Reviewed  WET PREP, GENITAL - Abnormal; Notable for the following:    WBC, Wet Prep HPF POC FEW (*)    All other components within normal limits  CBC WITH DIFFERENTIAL - Abnormal; Notable for the following:    Hemoglobin 8.6 (*)    HCT 28.4 (*)    MCV 70.8 (*)    MCH 21.4 (*)    RDW 18.1 (*)    All other components within normal limits  COMPREHENSIVE METABOLIC PANEL - Abnormal; Notable for the following:    Glucose, Bld 131 (*)    BUN 5 (*)    Anion gap 3 (*)    All other  components within normal limits  I-STAT BETA HCG BLOOD, ED ([facility], [facility], [facility] ONLY)  SAMPLE TO BLOOD BANK  GC/CHLAMYDIA PROBE AMP ([place])    Imaging Review No results found.   EKG Interpretation None      MDM   Final diagnoses:  Menorrhagia with irregular cycle  Iron deficiency anemia due to chronic blood loss    Filed Vitals:   03/01/14 1357 03/01/14 1600 03/01/14 1849  BP: 153/88 105/70 115/80  Pulse: 104  89  Temp: 98.3 F (36.8 C)  98.5 F (36.9 C)  TempSrc: Oral  Oral  Resp: 20  18  Height: 5\' 7"  (1.702 m)    Weight: 273 lb (123.832 kg)    SpO2: 100%  97%    Carrie Neal is a pleasant 44 y.o. female presenting with irregular menses and heavy vaginal bleeding over the course of 2 weeks, she is feeling generally fatigued, lightheaded and having shortness of breath. Hemoglobin is 8.6, this is down 1 point from 2 years ago. MCV is mildly reduced at 70.8. Don't think there is a need for emergent transfusion at this time.  OB/GYN consult from Dr. Kennon Rounds appreciated: I have advised her of patient's clinical assessment and hemoglobin, she recommends starting Megace 40 mg 3 times a day until the bleeding slows then changing to twice a day and 1 per day until she was seen by OB/GYN. Agrees with no indication for emergent imaging at this time, I will set her up with outpatient ultrasound.  Will start her on iron supplementations as well.  Evaluation does not show pathology that would require ongoing emergent intervention or inpatient treatment. Pt is hemodynamically stable and mentating appropriately. Discussed findings and plan with patient/guardian, who agrees with care plan. All questions answered. Return precautions discussed and outpatient follow up given.   Discharge Medication List as of 03/01/2014  6:25 PM    START taking these medications   Details  ferrous sulfate 325 (65 FE) MG tablet Take 1 tablet (325 mg total) by mouth 3 (three) times daily with meals., Starting  03/01/2014, Until Discontinued, Print    megestrol (MEGACE) 40 MG tablet Take one tab orally 3 times daily until bleeding begins to slow, then switch to 2 times daily, change to 1 time daily until you are evaluated by OB/GYN at [facility] hospital, Print             Monico Blitz, PA-C 03/01/14 2032  Debby Freiberg, MD 03/02/14 [phone]

## 2014-03-02 LAB — GC/CHLAMYDIA PROBE AMP (~~LOC~~) NOT AT ARMC
CHLAMYDIA, DNA PROBE: NEGATIVE
Neisseria Gonorrhea: NEGATIVE

## 2014-03-16 ENCOUNTER — Other Ambulatory Visit: Payer: Self-pay | Admitting: Obstetrics & Gynecology

## 2014-03-16 ENCOUNTER — Other Ambulatory Visit (HOSPITAL_COMMUNITY)
Admission: RE | Admit: 2014-03-16 | Discharge: 2014-03-16 | Disposition: A | Discharge status: Home / Self Care | Payer: BLUE CROSS/BLUE SHIELD | Source: Ambulatory Visit | Attending: Obstetrics & Gynecology | Admitting: Obstetrics & Gynecology

## 2014-03-16 DIAGNOSIS — Z01411 Encounter for gynecological examination (general) (routine) with abnormal findings: Secondary | ICD-10-CM | POA: Diagnosis present

## 2014-03-16 DIAGNOSIS — Z1151 Encounter for screening for human papillomavirus (HPV): Secondary | ICD-10-CM | POA: Diagnosis present

## 2014-03-21 LAB — CYTOLOGY - PAP

## 2014-03-22 ENCOUNTER — Encounter (HOSPITAL_COMMUNITY): Payer: Self-pay | Admitting: General Practice

## 2014-03-22 ENCOUNTER — Observation Stay (HOSPITAL_COMMUNITY): Payer: BLUE CROSS/BLUE SHIELD

## 2014-03-22 ENCOUNTER — Observation Stay (HOSPITAL_COMMUNITY)
Admission: AD | Admit: 2014-03-22 | Discharge: 2014-03-23 | Disposition: A | Discharge status: Home / Self Care | Payer: BLUE CROSS/BLUE SHIELD | Source: Ambulatory Visit | Attending: Obstetrics & Gynecology | Admitting: Obstetrics & Gynecology

## 2014-03-22 DIAGNOSIS — Z79899 Other long term (current) drug therapy: Secondary | ICD-10-CM | POA: Diagnosis not present

## 2014-03-22 DIAGNOSIS — R000 Tachycardia, unspecified: Secondary | ICD-10-CM | POA: Diagnosis not present

## 2014-03-22 DIAGNOSIS — N921 Excessive and frequent menstruation with irregular cycle: Secondary | ICD-10-CM | POA: Diagnosis not present

## 2014-03-22 DIAGNOSIS — Z3202 Encounter for pregnancy test, result negative: Secondary | ICD-10-CM | POA: Insufficient documentation

## 2014-03-22 DIAGNOSIS — Z793 Long term (current) use of hormonal contraceptives: Secondary | ICD-10-CM | POA: Insufficient documentation

## 2014-03-22 DIAGNOSIS — R110 Nausea: Secondary | ICD-10-CM | POA: Insufficient documentation

## 2014-03-22 DIAGNOSIS — R42 Dizziness and giddiness: Secondary | ICD-10-CM | POA: Diagnosis not present

## 2014-03-22 DIAGNOSIS — N938 Other specified abnormal uterine and vaginal bleeding: Secondary | ICD-10-CM | POA: Diagnosis present

## 2014-03-22 DIAGNOSIS — R531 Weakness: Secondary | ICD-10-CM | POA: Insufficient documentation

## 2014-03-22 DIAGNOSIS — D649 Anemia, unspecified: Secondary | ICD-10-CM | POA: Diagnosis present

## 2014-03-22 DIAGNOSIS — R51 Headache: Secondary | ICD-10-CM | POA: Insufficient documentation

## 2014-03-22 DIAGNOSIS — N920 Excessive and frequent menstruation with regular cycle: Secondary | ICD-10-CM

## 2014-03-22 HISTORY — DX: Other specified abnormal uterine and vaginal bleeding: N93.8

## 2014-03-22 LAB — CBC
HCT: 24.9 % — ABNORMAL LOW (ref 36.0–46.0)
HEMATOCRIT: 22 % — AB (ref 36.0–46.0)
Hemoglobin: 6.7 g/dL — CL (ref 12.0–15.0)
Hemoglobin: 7.9 g/dL — ABNORMAL LOW (ref 12.0–15.0)
MCH: 21.4 pg — ABNORMAL LOW (ref 26.0–34.0)
MCH: 23.4 pg — ABNORMAL LOW (ref 26.0–34.0)
MCHC: 30.5 g/dL (ref 30.0–36.0)
MCHC: 31.7 g/dL (ref 30.0–36.0)
MCV: 70.3 fL — ABNORMAL LOW (ref 78.0–100.0)
MCV: 73.9 fL — ABNORMAL LOW (ref 78.0–100.0)
PLATELETS: 289 10*3/uL (ref 150–400)
Platelets: 252 10*3/uL (ref 150–400)
RBC: 3.13 MIL/uL — AB (ref 3.87–5.11)
RBC: 3.37 MIL/uL — AB (ref 3.87–5.11)
RDW: 17.7 % — AB (ref 11.5–15.5)
RDW: 18.5 % — AB (ref 11.5–15.5)
WBC: 4.1 10*3/uL (ref 4.0–10.5)
WBC: 4.7 10*3/uL (ref 4.0–10.5)

## 2014-03-22 LAB — PREPARE RBC (CROSSMATCH)

## 2014-03-22 LAB — ABO/RH: ABO/RH(D): O POS

## 2014-03-22 LAB — POCT PREGNANCY, URINE: Preg Test, Ur: NEGATIVE

## 2014-03-22 MED ORDER — PROMETHAZINE HCL 25 MG PO TABS
25.0000 mg | ORAL_TABLET | Freq: Once | ORAL | Status: AC
  Administered 2014-03-22: 25 mg via ORAL
  Filled 2014-03-22: qty 1

## 2014-03-22 MED ORDER — DIPHENHYDRAMINE HCL 25 MG PO CAPS
25.0000 mg | ORAL_CAPSULE | Freq: Once | ORAL | Status: AC
  Administered 2014-03-22: 25 mg via ORAL
  Filled 2014-03-22: qty 1

## 2014-03-22 MED ORDER — KETOROLAC TROMETHAMINE 60 MG/2ML IM SOLN
60.0000 mg | Freq: Once | INTRAMUSCULAR | Status: AC
  Administered 2014-03-22: 60 mg via INTRAMUSCULAR
  Filled 2014-03-22: qty 2

## 2014-03-22 MED ORDER — ESTROGENS CONJUGATED 1.25 MG PO TABS: 2.5000 mg | ORAL_TABLET | Freq: Every day | ORAL | Status: DC

## 2014-03-22 MED ORDER — PRENATAL MULTIVITAMIN CH
1.0000 | ORAL_TABLET | Freq: Every day | ORAL | Status: DC
  Administered 2014-03-22 – 2014-03-23 (×2): 1 via ORAL
  Filled 2014-03-22 (×2): qty 1

## 2014-03-22 MED ORDER — ACETAMINOPHEN 325 MG PO TABS
650.0000 mg | ORAL_TABLET | Freq: Once | ORAL | Status: AC
  Administered 2014-03-22: 650 mg via ORAL
  Filled 2014-03-22: qty 2

## 2014-03-22 MED ORDER — SODIUM CHLORIDE 0.9 % IV SOLN
Freq: Once | INTRAVENOUS | Status: AC
  Administered 2014-03-23: 01:00:00 via INTRAVENOUS

## 2014-03-22 MED ORDER — ESTROGENS CONJUGATED 1.25 MG PO TABS
2.5000 mg | ORAL_TABLET | Freq: Four times a day (QID) | ORAL | Status: DC
  Administered 2014-03-22 – 2014-03-23 (×4): 2.5 mg via ORAL
  Filled 2014-03-22 (×9): qty 2

## 2014-03-22 MED ORDER — SODIUM CHLORIDE 0.9 % IV SOLN: Freq: Once | INTRAVENOUS | Status: DC

## 2014-03-22 MED ORDER — SODIUM CHLORIDE 0.9 % IV SOLN
INTRAVENOUS | Status: DC
  Administered 2014-03-22 (×2): via INTRAVENOUS

## 2014-03-22 MED ORDER — TRAMADOL HCL 50 MG PO TABS
50.0000 mg | ORAL_TABLET | Freq: Four times a day (QID) | ORAL | Status: DC
  Administered 2014-03-22 – 2014-03-23 (×3): 50 mg via ORAL
  Filled 2014-03-22 (×4): qty 1

## 2014-03-22 MED ORDER — FUROSEMIDE 10 MG/ML IJ SOLN
20.0000 mg | Freq: Once | INTRAMUSCULAR | Status: AC
  Administered 2014-03-22: 20 mg via INTRAVENOUS
  Filled 2014-03-22: qty 2

## 2014-03-22 NOTE — MAU Note (Signed)
Urine in lab 

## 2014-03-22 NOTE — MAU Note (Signed)
Pt stated she has been bleeding x 1 month. Seen her docotor put on megace without releif then started on BCP . Still bleeding heavier blot clots and felt like she was going to pass out. Feels SOB and that her heart is pounding

## 2014-03-22 NOTE — H&P (Signed)
Carrie Neal is an 44 y.o. female.  G33P6 female who presents for management of her abnormal uterine bleeding. Pt states that she has always had a heavy period, but over the past couple of years she has noted worsening of her bleeding. Menses last for 2-3 weeks and requiring her at times to change a super pad every hour. She also notes passage of plum to apple sized clots.   This bleeding has been going on since the start of January.  An EMB was done in the office and the patient was given a course of OCPs, which did not improve her bleeding.  She was trying to hold off on coming in, but today she started noted headache and dizziness and was sent to the ER.  + Dysmenorrhea.  No abdominal discharge, itching or burning.  The patient denies a h/o fibroids.  She had been seen before in the ER and per patient she was always given medicine and told to follow up.  Pertinent Gynecological History: Menses: see above Bleeding: abnormal uterine bleeding Contraception: OCP (estrogen/progesterone) DES exposure: denies Blood transfusions: none Sexually transmitted diseases: no past history Pap performed:  03/16/14 results pending   Past Medical History  Diagnosis Date  . Dysfunctional uterine bleeding 12/2013    Past Surgical History  Procedure Laterality Date  . Cholecystectomy    . Tubal ligation    . Umbilical hernia repair      as child    History reviewed. No pertinent family history.  Social History:  reports that she has never smoked. She does not have any smokeless tobacco history on file. She reports that she does not drink alcohol or use illicit drugs.  Allergies:  Allergies  Allergen Reactions  . Codeine Itching    Prescriptions prior to admission  Medication Sig Dispense Refill Last Dose  . ibuprofen (ADVIL,MOTRIN) 200 MG tablet Take 600 mg by mouth every 6 (six) hours as needed for pain.   03/21/2014 at Unknown time  . Multiple Vitamin (MULTIVITAMIN WITH MINERALS) TABS tablet Take 1  tablet by mouth daily.   Past Week at Unknown time  . norethindrone-ethinyl estradiol-iron (MICROGESTIN FE,GILDESS FE,LOESTRIN FE) 1.5-30 MG-MCG tablet Take 1 tablet by mouth daily.   03/22/2014 at Unknown time  . ferrous sulfate 325 (65 FE) MG tablet Take 1 tablet (325 mg total) by mouth 3 (three) times daily with meals. (Patient not taking: Reported on 03/22/2014) 60 tablet 0   .         Review of Systems  Constitutional: Positive for malaise/fatigue. Negative for fever and chills.  Eyes: Negative for blurred vision and double vision.  Cardiovascular: Negative for chest pain and palpitations.  Gastrointestinal: Positive for nausea and abdominal pain. Negative for heartburn and vomiting.  Neurological: Positive for dizziness, weakness and headaches.    Blood pressure 112/55, pulse 98, temperature 98.6 F (37 C), temperature source Oral, resp. rate 18, SpO2 100 %. Physical Exam  Constitutional: She is oriented to person, place, and time. She appears well-developed.  HENT:  Head: Normocephalic.  Eyes: EOM are normal.  Neck: Normal range of motion.  Cardiovascular: Normal heart sounds.   +tachycardia  Respiratory: Effort normal and breath sounds normal.  GI: Soft. Bowel sounds are normal. There is no tenderness. There is no rebound and no guarding.  Genitourinary:  Deferred, examination performed in MAU.  Current pad has moderate blod, <1/2 pad full  Musculoskeletal: Normal range of motion.  Neurological: She is alert and oriented to person, place,  and time.  Skin: Skin is warm and dry.  Psychiatric: She has a normal mood and affect.  Ext: No edema bilaterally, no calf tenderness bilaterally  Results for orders placed or performed during the hospital encounter of 03/22/14 (from the past 24 hour(s))  Pregnancy, urine POC     Status: None   Collection Time: 03/22/14  9:27 AM  Result Value Ref Range   Preg Test, Ur NEGATIVE NEGATIVE  CBC     Status: Abnormal   Collection Time: 03/22/14   9:49 AM  Result Value Ref Range   WBC 4.1 4.0 - 10.5 K/uL   RBC 3.13 (L) 3.87 - 5.11 MIL/uL   Hemoglobin 6.7 (LL) 12.0 - 15.0 g/dL   HCT 22.0 (L) 36.0 - 46.0 %   MCV 70.3 (L) 78.0 - 100.0 fL   MCH 21.4 (L) 26.0 - 34.0 pg   MCHC 30.5 30.0 - 36.0 g/dL   RDW 17.7 (H) 11.5 - 15.5 %   Platelets 289 150 - 400 K/uL  Prepare RBC (crossmatch)     Status: None   Collection Time: 03/22/14 11:00 AM  Result Value Ref Range   Order Confirmation ORDER PROCESSED BY BLOOD BANK   Type and screen     Status: None (Preliminary result)   Collection Time: 03/22/14 11:15 AM  Result Value Ref Range   ABO/RH(D) O POS    Antibody Screen NEG    Sample Expiration 03/25/2014    Unit Number [id number]    Blood Component Type RBC LR PHER2    Unit division 00    Status of Unit ALLOCATED    Transfusion Status OK TO TRANSFUSE    Crossmatch Result Compatible    Unit Number [id number]    Blood Component Type RED CELLS,LR    Unit division 00    Status of Unit ALLOCATED    Transfusion Status OK TO TRANSFUSE    Crossmatch Result Compatible   ABO/Rh     Status: None   Collection Time: 03/22/14 11:15 AM  Result Value Ref Range   ABO/RH(D) O POS     Assessment/Plan: 31LK T6Y5 who presents for worsening abnormal uterine bleeding  -IVF: LR @ 125cc/hr -Type & Cross for [facility] with repeat CBC 4hr posttransfusion -TVUS to be performed to assess for underlying etiology -Continue monitoring vaginal bleeding -Sips/chips only -Premarin 2.5mg  4x daily until bleeding improves then decrease to twice daily -Reviewed potential options with patient, she would be interested in hysteroscopy, D&C, endometrial ablation.  Should pt be stabilized s/p transfusion, will plan for outpatient management   Janyth Pupa, M 03/22/2014, 12:48 PM

## 2014-03-22 NOTE — MAU Provider Note (Signed)
History     CSN: [id number]  Arrival date and time: 03/22/14 1517   First Provider Initiated Contact with Patient 03/22/14 [id number]      Chief Complaint  Patient presents with  . Vaginal Bleeding   HPI Comments: Carrie Neal 44 y.o. No obstetric history on file. Presents to MAU with heavy vaginal bleeding for one month. She was seen on 1/14 for same and placed on Megace 40 mg TID taper down to daily. She felt the megace was not helpful in stopping bleeding. She since then has been seeing [name] and had an Endometrial biopsy on 1/20 that was negative for cancer. She was scheduled for ultrasound in office today but experienced SOB with exertion and her heart is pounding. Her last H/H was 8.6 and 28.4 on Mar 01, 2014  Vaginal Bleeding Associated symptoms include abdominal pain.      Past Medical History  Diagnosis Date  . Dysfunctional uterine bleeding 12/2013    Past Surgical History  Procedure Laterality Date  . Cholecystectomy    . Tubal ligation    . Umbilical hernia repair      as child    History reviewed. No pertinent family history.  History  Substance Use Topics  . Smoking status: Never Smoker   . Smokeless tobacco: Not on file  . Alcohol Use: No    Allergies:  Allergies  Allergen Reactions  . Codeine Itching    Prescriptions prior to admission  Medication Sig Dispense Refill Last Dose  . ibuprofen (ADVIL,MOTRIN) 200 MG tablet Take 600 mg by mouth every 6 (six) hours as needed for pain.   03/21/2014 at Unknown time  . Multiple Vitamin (MULTIVITAMIN WITH MINERALS) TABS tablet Take 1 tablet by mouth daily.   Past Week at Unknown time  . norethindrone-ethinyl estradiol-iron (MICROGESTIN FE,GILDESS FE,LOESTRIN FE) 1.5-30 MG-MCG tablet Take 1 tablet by mouth daily.   03/22/2014 at Unknown time  . ferrous sulfate 325 (65 FE) MG tablet Take 1 tablet (325 mg total) by mouth 3 (three) times daily with meals. (Patient not taking: Reported on 03/22/2014) 60 tablet 0   .  megestrol (MEGACE) 40 MG tablet Take one tab orally 3 times daily until bleeding begins to slow, then switch to 2 times daily, change to 1 time daily until you are evaluated by OB/GYN at [facility] hospital (Patient not taking: Reported on 03/22/2014) 12 tablet 0     Review of Systems  Respiratory: Positive for shortness of breath.   Cardiovascular:       Heart pounding  Gastrointestinal: Positive for abdominal pain.       Like menstrual cramps  Genitourinary: Positive for vaginal bleeding.  Psychiatric/Behavioral: Negative.    Physical Exam   Blood pressure 131/80, pulse 109, temperature 99 F (37.2 C), temperature source Oral, resp. rate 18, SpO2 100 %.  Physical Exam  Constitutional: She appears well-developed and well-nourished. No distress.  Morbid Obesity  HENT:  Head: Normocephalic and atraumatic.  Eyes: Pupils are equal, round, and reactive to light.  Cardiovascular: Regular rhythm and normal heart sounds.   tachycardia  Respiratory: Effort normal and breath sounds normal. No respiratory distress. She has no wheezes. She has no rales.  GI: Soft. Bowel sounds are normal. She exhibits no distension. There is no tenderness. There is no rebound and no guarding.  Genitourinary:  Genital:external bloody Vaginal: blood in vault/ 4 fox swabs to clear/ medium sized clots Cervix: multip Bimanual: Tender   Musculoskeletal: Normal range of motion.  Neurological:  She is alert.  Skin: Skin is warm and dry.  Psychiatric: She has a normal mood and affect. Her behavior is normal. Judgment and thought content normal.   Results for orders placed or performed during the hospital encounter of 03/22/14 (from the past 24 hour(s))  Pregnancy, urine POC     Status: None   Collection Time: 03/22/14  9:27 AM  Result Value Ref Range   Preg Test, Ur NEGATIVE NEGATIVE  CBC     Status: Abnormal   Collection Time: 03/22/14  9:49 AM  Result Value Ref Range   WBC 4.1 4.0 - 10.5 K/uL   RBC 3.13 (L)  3.87 - 5.11 MIL/uL   Hemoglobin 6.7 (LL) 12.0 - 15.0 g/dL   HCT 22.0 (L) 36.0 - 46.0 %   MCV 70.3 (L) 78.0 - 100.0 fL   MCH 21.4 (L) 26.0 - 34.0 pg   MCHC 30.5 30.0 - 36.0 g/dL   RDW 17.7 (H) 11.5 - 15.5 %   Platelets 289 150 - 400 K/uL    MAU Course  Procedures  MDM  Toradol 60 mg IM Phenergan 25 mg po now [name] will admit to floor for T&C 2 units  Start IV   Assessment and Plan  A: DUB Anemia due to blood loss  P: Admit  Carrie Neal 03/22/2014, 10:25 AM

## 2014-03-22 NOTE — Progress Notes (Signed)
CRITICAL VALUE ALERT  Critical value received:  6.7  Date of notification:  03/22/2014  Time of notification:  1443  Critical value read back: yes  Nurse who received alert:  Annett Gula, c., rn  MD notified (1st page):  Monna Fam, NP  Time of first page:  81  MD notified (2nd page):  Time of second page:  Responding MD:  Monna Fam, NP  Time MD responded:  1019

## 2014-03-22 NOTE — Discharge Instructions (Signed)
Abnormal Uterine Bleeding Abnormal uterine bleeding can affect women at various stages in life, including teenagers, women in their reproductive years, pregnant women, and women who have reached menopause. Several kinds of uterine bleeding are considered abnormal, including:  Bleeding or spotting between periods.   Bleeding after sexual intercourse.   Bleeding that is heavier or more than normal.   Periods that last longer than usual.  Bleeding after menopause.  Many cases of abnormal uterine bleeding are minor and simple to treat, while others are more serious. Any type of abnormal bleeding should be evaluated by your health care provider. Treatment will depend on the cause of the bleeding. HOME CARE INSTRUCTIONS Monitor your condition for any changes. The following actions may help to alleviate any discomfort you are experiencing: -Please take the Premarin one tablet every 8 hours until the bleeding slows or discontinues.  Then continue medicine twice daily. -You may take over the counter Motrin or Tylenol as needed for pain. -Make sure to have plenty of rest and stay well hydrated. -The office will be in touch with you regarding scheduling your surgery. SEEK MEDICAL CARE IF:   You feel dizzy at times.  SEEK IMMEDIATE MEDICAL CARE IF:   You pass out.   You are changing pads every 15 to 30 minutes.   You have abdominal pain.  You have a fever.   You become sweaty or weak.   You are passing large blood clots from the vagina.   You start to feel nauseous and vomit. MAKE SURE YOU:   Understand these instructions.  Will watch your condition.  Will get help right away if you are not doing well or get worse. Document Released: 02/02/2005 Document Revised: 02/07/2013 Document Reviewed: 09/01/2012 [facility] Patient Information 2015 [place], [place]. This information is not intended to replace advice given to you by your health care provider. Make sure you discuss any  questions you have with your health care provider.

## 2014-03-22 NOTE — Progress Notes (Signed)
Carrie Neal [id number]  Subjective: Patient resting in bed.  Reports feeling better and ready to go home.    Objective:  Filed Vitals:   03/22/14 1710 03/22/14 1730 03/22/14 1830 03/22/14 1933  BP: 104/51 105/53 119/66 121/57  Pulse: 91 89 100 96  Temp: 97.5 F (36.4 C) 98.1 F (36.7 C) 98.1 F (36.7 C) 97.8 F (36.6 C)  TempSrc:   Oral Oral  Resp: 18 18 18 18   Height:      Weight:      SpO2: 100% 100% 100% 100%   Results for orders placed or performed during the hospital encounter of 03/22/14 (from the past 24 hour(s))  Pregnancy, urine POC     Status: None   Collection Time: 03/22/14  9:27 AM  Result Value Ref Range   Preg Test, Ur NEGATIVE NEGATIVE  CBC     Status: Abnormal   Collection Time: 03/22/14  9:49 AM  Result Value Ref Range   WBC 4.1 4.0 - 10.5 K/uL   RBC 3.13 (L) 3.87 - 5.11 MIL/uL   Hemoglobin 6.7 (LL) 12.0 - 15.0 g/dL   HCT 22.0 (L) 36.0 - 46.0 %   MCV 70.3 (L) 78.0 - 100.0 fL   MCH 21.4 (L) 26.0 - 34.0 pg   MCHC 30.5 30.0 - 36.0 g/dL   RDW 17.7 (H) 11.5 - 15.5 %   Platelets 289 150 - 400 K/uL  Prepare RBC (crossmatch)     Status: None   Collection Time: 03/22/14 11:00 AM  Result Value Ref Range   Order Confirmation ORDER PROCESSED BY BLOOD BANK   Type and screen     Status: None (Preliminary result)   Collection Time: 03/22/14 11:15 AM  Result Value Ref Range   ABO/RH(D) O POS    Antibody Screen NEG    Sample Expiration 03/25/2014    Unit Number [id number]    Blood Component Type RBC LR PHER2    Unit division 00    Status of Unit ISSUED    Transfusion Status OK TO TRANSFUSE    Crossmatch Result Compatible    Unit Number [id number]    Blood Component Type RED CELLS,LR    Unit division 00    Status of Unit ISSUED    Transfusion Status OK TO TRANSFUSE    Crossmatch Result Compatible    Unit Number [id number]    Blood Component Type RED CELLS,LR    Unit division 00    Status of Unit ALLOCATED    Transfusion Status OK TO TRANSFUSE     Crossmatch Result Compatible    Unit Number [id number]    Blood Component Type RBC LR PHER1    Unit division 00    Status of Unit ALLOCATED    Transfusion Status OK TO TRANSFUSE    Crossmatch Result Compatible   ABO/Rh     Status: None   Collection Time: 03/22/14 11:15 AM  Result Value Ref Range   ABO/RH(D) O POS   CBC     Status: Abnormal   Collection Time: 03/22/14  9:30 PM  Result Value Ref Range   WBC 4.7 4.0 - 10.5 K/uL   RBC 3.37 (L) 3.87 - 5.11 MIL/uL   Hemoglobin 7.9 (L) 12.0 - 15.0 g/dL   HCT 24.9 (L) 36.0 - 46.0 %   MCV 73.9 (L) 78.0 - 100.0 fL   MCH 23.4 (L) 26.0 - 34.0 pg   MCHC 31.7 30.0 - 36.0 g/dL   RDW 18.5 (H) 11.5 - 15.5 %  Platelets 252 150 - 400 K/uL  Prepare RBC     Status: None   Collection Time: 03/22/14 10:30 PM  Result Value Ref Range   Order Confirmation ORDER PROCESSED BY BLOOD BANK     Assessment: 44 y.o. Female Active Bleeding Symptomatic Anemia  Plan: Dr. Chauncey Cruel. [place] contacted and advised as below Transfuse 2Units per Dr. Kathlen Mody recommendations Discussed need for additional transfusion with patient who verbalized understanding Questions and Concerns addressed Nurse instructed to call after 2nd bag complete for lab draw 4 hrs s/p Will update Dr. Kathlen Mody accordingly  Marcy Panning, [place], CNM 03/22/2014 10:48 PM

## 2014-03-23 LAB — HEMOGLOBIN AND HEMATOCRIT, BLOOD
HCT: 29.9 % — ABNORMAL LOW (ref 36.0–46.0)
Hemoglobin: 9.8 g/dL — ABNORMAL LOW (ref 12.0–15.0)

## 2014-03-23 NOTE — Progress Notes (Signed)
Carrie Neal [id number]  Subjective: Patient resting in bed and feeling much better this am.  No F/C/CP/SOB.  No headache or dizziness, ambulating without difficulty.  Tolerating general diet.  Bleeding now minimal, denies passage of large clots.  Abdominal pain has also significantly improved.  Objective: BP 123/68 mmHg  Pulse 82  Temp(Src) 98.2 F (36.8 C) (Oral)  Resp 18  Ht 5\' 7"  (1.702 m)  Wt 123.832 kg (273 lb)  BMI 42.75 kg/m2  SpO2 99%   GEN: NAD CV: RRR Abd: obese, soft, non-tender GU: Minimal blood noted on pad Ext: no calf tenderness bilaterally   TVUS:  IMPRESSION: Focal endometrial thickening, suspicious for a possible 2.4 cm endometrial polyp, although without associated vascularity. Consider hysteroscopy for further evaluation.  Bilateral ovarian cysts, measuring up to 5.0 cm in the left ovary. Follow-up pelvic ultrasound is suggested in 6-12 weeks.   Assessment: 44 y.o. Female Abnormal uterine bleeding, Symptomatic Anemia  Plan: S/p 4upRBC, post transfusion CBC this am pending Plan for scheduled hysteroscopy, D&C, polypectomy endometrial ablation this upcoming week Encourage po hydration Plan for iron tid Pt to continue Premarin 2.5mg  4x daily then decrease to twice daily once bleeding stops.  Reviewed VTE precautions, pt is aware of increased risk due to estrogen intake and agrees to continue with medication to improve her bleeding Follow up as scheduled this upcoming week  Plan for discharge home s/p repeat CBC   Janyth Pupa, M, DO 03/23/2014 9:36 AM

## 2014-03-23 NOTE — Progress Notes (Signed)
Pt ambulated out  Teaching complete 

## 2014-03-24 LAB — TYPE AND SCREEN
ABO/RH(D): O POS
ANTIBODY SCREEN: NEGATIVE
UNIT DIVISION: 0
UNIT DIVISION: 0
Unit division: 0
Unit division: 0
Unit division: 0

## 2014-03-28 ENCOUNTER — Encounter (HOSPITAL_COMMUNITY): Payer: Self-pay | Admitting: *Deleted

## 2014-03-28 ENCOUNTER — Other Ambulatory Visit: Payer: Self-pay | Admitting: Obstetrics & Gynecology

## 2014-03-29 NOTE — Discharge Summary (Signed)
Physician Discharge Summary  Patient ID: Carrie Neal MRN: [id number] DOB/AGE: March 31, 1970 44 y.o.  Admit date: 03/22/2014 Discharge date: 03/23/2014  Admission Diagnoses: Symptomatic anemia, Abnormal uterine bleeding  Discharge Diagnoses:  Active Problems:   DUB (dysfunctional uterine bleeding)   Anemia   Severe anemia   Discharged Condition: stable  Hospital Course: Carrie Neal is an 44 y.o. Female G69P6 female who was admitted for symptomatic anemia and management of her abnormal uterine bleeding. Patient was admitted for worsening abnormal uterine bleeding with headache and dizziness.  Carrie Neal had recently established care with [facility] where an EMB was done in the office and pt given a course of OCPs as previous treatment with Megace had failed.  Due to symptomatic anemia with Hgb of 6.7, the patient was admitted for blood transfusion and oral estrogen.  During her hospital course, her bleeding improved, VSS and after full work up (including TVUS)- management plan was set for hysteroscopy, D&C, endometrial ablation as an outpatient.  Patient was discharged home with plans for close outpatient follow up.  Consults: None  Significant Diagnostic Studies: labs:  CBC Latest Ref Rng 03/23/2014 03/22/2014 03/22/2014  WBC 4.0 - 10.5 K/uL - 4.7 4.1  Hemoglobin 12.0 - 15.0 g/dL 9.8(L) 7.9(L) 6.7(LL)  Hematocrit 36.0 - 46.0 % 29.9(L) 24.9(L) 22.0(L)  Platelets 150 - 400 K/uL - 252 289   and TVUS: Focal endometrial thickening, suspicious for a possible 2.4 cm endometrial polyp, although without associated vascularity. Consider hysteroscopy for further evaluation. Bilateral ovarian cysts, measuring up to 5.0 cm in the left ovary. Follow-up pelvic ultrasound is suggested in 6-12 weeks.  Treatments: IV hydration, analgesia: acetaminophen and 4 units blood transfusion   Discharge Exam: Blood pressure 114/59, pulse 92, temperature 98.4 F (36.9 C), temperature source Oral, resp. rate 18, height 5\' 7"   (1.702 m), weight 123.832 kg (273 lb), SpO2 100 %. GEN: NADCV: RRR Abd: obese, soft, non-tender GU: Minimal blood noted on pad Ext: no calf tenderness bilaterally  Disposition: 01-Home or Self Care     Medication List    STOP taking these medications        megestrol 40 MG tablet  Commonly known as:  MEGACE     norethindrone-ethinyl estradiol-iron 1.5-30 MG-MCG tablet  Commonly known as:  MICROGESTIN FE,GILDESS FE,LOESTRIN FE      TAKE these medications        estrogens (conjugated) 1.25 MG tablet  Commonly known as:  PREMARIN  Take 2 tablets (2.5 mg total) by mouth daily.     ferrous sulfate 325 (65 FE) MG tablet  Take 1 tablet (325 mg total) by mouth 3 (three) times daily with meals.     ibuprofen 200 MG tablet  Commonly known as:  ADVIL,MOTRIN  Take 600 mg by mouth every 6 (six) hours as needed for pain.     multivitamin with minerals Tabs tablet  Take 1 tablet by mouth daily.           Follow-up Information    Follow up with Janyth Pupa, M, DO In 3 days.   Specialty:  Obstetrics and Gynecology   Contact information:   [address] [place] [address] [phone]       Signed: Annalee Genta 03/29/2014, 9:13 PM

## 2014-03-30 ENCOUNTER — Encounter (HOSPITAL_COMMUNITY)
Admission: RE | Disposition: A | Discharge status: Home / Self Care | Payer: Self-pay | Source: Ambulatory Visit | Attending: Obstetrics & Gynecology

## 2014-03-30 ENCOUNTER — Ambulatory Visit (HOSPITAL_COMMUNITY): Payer: BLUE CROSS/BLUE SHIELD | Admitting: Registered Nurse

## 2014-03-30 ENCOUNTER — Ambulatory Visit (HOSPITAL_COMMUNITY)
Admission: RE | Admit: 2014-03-30 | Discharge: 2014-03-30 | Disposition: A | Discharge status: Home / Self Care | Payer: BLUE CROSS/BLUE SHIELD | Source: Ambulatory Visit | Attending: Obstetrics & Gynecology | Admitting: Obstetrics & Gynecology

## 2014-03-30 ENCOUNTER — Encounter (HOSPITAL_COMMUNITY): Payer: Self-pay | Admitting: Registered Nurse

## 2014-03-30 DIAGNOSIS — N920 Excessive and frequent menstruation with regular cycle: Secondary | ICD-10-CM | POA: Insufficient documentation

## 2014-03-30 DIAGNOSIS — N859 Noninflammatory disorder of uterus, unspecified: Secondary | ICD-10-CM | POA: Insufficient documentation

## 2014-03-30 DIAGNOSIS — Z862 Personal history of diseases of the blood and blood-forming organs and certain disorders involving the immune mechanism: Secondary | ICD-10-CM | POA: Diagnosis not present

## 2014-03-30 DIAGNOSIS — N938 Other specified abnormal uterine and vaginal bleeding: Secondary | ICD-10-CM | POA: Diagnosis present

## 2014-03-30 HISTORY — DX: Anemia, unspecified: D64.9

## 2014-03-30 HISTORY — PX: DILATATION & CURETTAGE/HYSTEROSCOPY WITH MYOSURE: SHX6511

## 2014-03-30 LAB — CBC
HCT: 31.3 % — ABNORMAL LOW (ref 36.0–46.0)
Hemoglobin: 9.9 g/dL — ABNORMAL LOW (ref 12.0–15.0)
MCH: 24 pg — ABNORMAL LOW (ref 26.0–34.0)
MCHC: 31.6 g/dL (ref 30.0–36.0)
MCV: 75.8 fL — AB (ref 78.0–100.0)
Platelets: 370 10*3/uL (ref 150–400)
RBC: 4.13 MIL/uL (ref 3.87–5.11)
RDW: 18.5 % — ABNORMAL HIGH (ref 11.5–15.5)
WBC: 5.2 10*3/uL (ref 4.0–10.5)

## 2014-03-30 LAB — PREGNANCY, URINE: PREG TEST UR: NEGATIVE

## 2014-03-30 SURGERY — DILATATION & CURETTAGE/HYSTEROSCOPY WITH MYOSURE
Anesthesia: General | Site: Vagina

## 2014-03-30 MED ORDER — SCOPOLAMINE 1 MG/3DAYS TD PT72
MEDICATED_PATCH | TRANSDERMAL | Status: AC
  Administered 2014-03-30: 1.5 mg via TRANSDERMAL
  Filled 2014-03-30: qty 1

## 2014-03-30 MED ORDER — FENTANYL CITRATE 0.05 MG/ML IJ SOLN
INTRAMUSCULAR | Status: AC
  Filled 2014-03-30: qty 2

## 2014-03-30 MED ORDER — FENTANYL CITRATE 0.05 MG/ML IJ SOLN
INTRAMUSCULAR | Status: DC | PRN
  Administered 2014-03-30: 25 ug via INTRAVENOUS
  Administered 2014-03-30: 50 ug via INTRAVENOUS
  Administered 2014-03-30: 25 ug via INTRAVENOUS
  Administered 2014-03-30 (×2): 50 ug via INTRAVENOUS

## 2014-03-30 MED ORDER — KETOROLAC TROMETHAMINE 30 MG/ML IJ SOLN
INTRAMUSCULAR | Status: AC
  Filled 2014-03-30: qty 1

## 2014-03-30 MED ORDER — MIDAZOLAM HCL 5 MG/5ML IJ SOLN
INTRAMUSCULAR | Status: DC | PRN
  Administered 2014-03-30: 2 mg via INTRAVENOUS

## 2014-03-30 MED ORDER — ONDANSETRON HCL 4 MG/2ML IJ SOLN
INTRAMUSCULAR | Status: DC | PRN
  Administered 2014-03-30: 4 mg via INTRAVENOUS

## 2014-03-30 MED ORDER — FENTANYL CITRATE 0.05 MG/ML IJ SOLN
25.0000 ug | INTRAMUSCULAR | Status: DC | PRN
  Administered 2014-03-30: 50 ug via INTRAVENOUS

## 2014-03-30 MED ORDER — ONDANSETRON HCL 4 MG/2ML IJ SOLN: 4.0000 mg | Freq: Once | INTRAMUSCULAR | Status: DC | PRN

## 2014-03-30 MED ORDER — BUPIVACAINE-EPINEPHRINE 0.5% -1:200000 IJ SOLN
INTRAMUSCULAR | Status: DC | PRN
  Administered 2014-03-30: 11 mL

## 2014-03-30 MED ORDER — PROPOFOL 10 MG/ML IV BOLUS
INTRAVENOUS | Status: AC
  Filled 2014-03-30: qty 20

## 2014-03-30 MED ORDER — ONDANSETRON HCL 4 MG/2ML IJ SOLN
INTRAMUSCULAR | Status: AC
  Filled 2014-03-30: qty 2

## 2014-03-30 MED ORDER — SODIUM CHLORIDE 0.9 % IR SOLN
Status: DC | PRN
  Administered 2014-03-30: 1

## 2014-03-30 MED ORDER — KETOROLAC TROMETHAMINE 30 MG/ML IJ SOLN
INTRAMUSCULAR | Status: DC | PRN
  Administered 2014-03-30: 30 mg via INTRAVENOUS

## 2014-03-30 MED ORDER — IBUPROFEN 600 MG PO TABS: 600.0000 mg | ORAL_TABLET | Freq: Four times a day (QID) | ORAL | Status: DC | PRN

## 2014-03-30 MED ORDER — OXYCODONE-ACETAMINOPHEN 5-325 MG PO TABS: 1.0000 | ORAL_TABLET | ORAL | Status: DC | PRN

## 2014-03-30 MED ORDER — FENTANYL CITRATE 0.05 MG/ML IJ SOLN
INTRAMUSCULAR | Status: AC
  Administered 2014-03-30: 50 ug via INTRAVENOUS
  Filled 2014-03-30: qty 2

## 2014-03-30 MED ORDER — LIDOCAINE HCL (CARDIAC) 20 MG/ML IV SOLN
INTRAVENOUS | Status: DC | PRN
  Administered 2014-03-30: 100 mg via INTRAVENOUS

## 2014-03-30 MED ORDER — SILVER NITRATE-POT NITRATE 75-25 % EX MISC
CUTANEOUS | Status: AC
  Filled 2014-03-30: qty 1

## 2014-03-30 MED ORDER — SCOPOLAMINE 1 MG/3DAYS TD PT72
1.0000 | MEDICATED_PATCH | TRANSDERMAL | Status: DC
  Administered 2014-03-30: 1.5 mg via TRANSDERMAL

## 2014-03-30 MED ORDER — BUPIVACAINE-EPINEPHRINE (PF) 0.5% -1:200000 IJ SOLN
INTRAMUSCULAR | Status: AC
  Filled 2014-03-30: qty 30

## 2014-03-30 MED ORDER — MIDAZOLAM HCL 2 MG/2ML IJ SOLN
INTRAMUSCULAR | Status: AC
  Filled 2014-03-30: qty 2

## 2014-03-30 MED ORDER — LIDOCAINE HCL (CARDIAC) 20 MG/ML IV SOLN
INTRAVENOUS | Status: AC
  Filled 2014-03-30: qty 5

## 2014-03-30 MED ORDER — PROPOFOL 10 MG/ML IV BOLUS
INTRAVENOUS | Status: DC | PRN
  Administered 2014-03-30: 200 mg via INTRAVENOUS

## 2014-03-30 MED ORDER — DEXAMETHASONE SODIUM PHOSPHATE 4 MG/ML IJ SOLN
INTRAMUSCULAR | Status: DC | PRN
  Administered 2014-03-30: 4 mg via INTRAVENOUS

## 2014-03-30 MED ORDER — DEXAMETHASONE SODIUM PHOSPHATE 4 MG/ML IJ SOLN
INTRAMUSCULAR | Status: AC
  Filled 2014-03-30: qty 1

## 2014-03-30 MED ORDER — CEFAZOLIN SODIUM-DEXTROSE 2-3 GM-% IV SOLR
INTRAVENOUS | Status: AC
  Filled 2014-03-30: qty 50

## 2014-03-30 MED ORDER — LACTATED RINGERS IV SOLN
INTRAVENOUS | Status: DC
  Administered 2014-03-30 (×2): via INTRAVENOUS

## 2014-03-30 SURGICAL SUPPLY — 22 items
CANISTER SUCT 3000ML (MISCELLANEOUS) ×3 IMPLANT
CATH ROBINSON RED A/P 16FR (CATHETERS) ×3 IMPLANT
CLOTH BEACON ORANGE TIMEOUT ST (SAFETY) ×3 IMPLANT
CONTAINER PREFILL 10% NBF 60ML (FORM) ×6 IMPLANT
DEVICE MYOSURE CLASSIC (MISCELLANEOUS) IMPLANT
DEVICE MYOSURE LITE (MISCELLANEOUS) IMPLANT
ELECT REM PT RETURN 9FT ADLT (ELECTROSURGICAL)
ELECTRODE REM PT RTRN 9FT ADLT (ELECTROSURGICAL) IMPLANT
GLOVE BIOGEL PI IND STRL 8.5 (GLOVE) ×1 IMPLANT
GLOVE BIOGEL PI INDICATOR 8.5 (GLOVE) ×2
GLOVE SURG SS PI 6.5 STRL IVOR (GLOVE) ×6 IMPLANT
GOWN STRL REUS W/TWL LRG LVL3 (GOWN DISPOSABLE) ×6 IMPLANT
LOOP ANGLED CUTTING 22FR (CUTTING LOOP) IMPLANT
MYOSURE XL FIBROID REM (MISCELLANEOUS) ×3
PACK VAGINAL MINOR WOMEN LF (CUSTOM PROCEDURE TRAY) ×3 IMPLANT
PAD OB MATERNITY 4.3X12.25 (PERSONAL CARE ITEMS) ×3 IMPLANT
SEAL ROD LENS SCOPE MYOSURE (ABLATOR) ×3 IMPLANT
SYSTEM TISS REMOVAL MYSR XL RM (MISCELLANEOUS) ×1 IMPLANT
TOWEL OR 17X24 6PK STRL BLUE (TOWEL DISPOSABLE) ×6 IMPLANT
TUBING AQUILEX INFLOW (TUBING) ×3 IMPLANT
TUBING AQUILEX OUTFLOW (TUBING) ×3 IMPLANT
WATER STERILE IRR 1000ML POUR (IV SOLUTION) ×3 IMPLANT

## 2014-03-30 NOTE — Discharge Instructions (Signed)
DISCHARGE INSTRUCTIONS: HYSTEROSCOPY / ENDOMETRIAL ABLATION The following instructions have been prepared to help you care for yourself upon your return home.  May remove Scop patch on or before 04/01/14.  May take Ibuprofen after 6:07 pm as needed for pain.   May take stool softner while taking narcotic medication to prevent constipation.   Drink plenty of water.  Personal hygiene:  Use sanitary pads for vaginal drainage, not tampons.  Shower the day after your procedure.  NO tub baths, pools or Jacuzzis for 2-3 weeks.  Wipe front to back after using the bathroom.  Activity and limitations:  Do NOT drive or operate any equipment for 24 hours. The effects of anesthesia are still present and drowsiness may result.  Do NOT rest in bed all day.  Walking is encouraged.  Walk up and down stairs slowly.  You may resume your normal activity in one to two days or as indicated by your physician.[name] Kitchen  Sexual activity: NO intercourse for at least 2 weeks after the procedure, or as indicated by your Doctor.  Diet: Eat a light meal as desired this evening. You may resume your usual diet tomorrow.  Return to Work: You may resume your work activities in one to two days or as indicated by Marine scientist.  What to expect after your surgery: Expect to have vaginal bleeding/discharge for 2-3 days and spotting for up to 10 days. It is not unusual to have soreness for up to 1-2 weeks. You may have a slight burning sensation when you urinate for the first day. Mild cramps may continue for a couple of days. You may have a regular period in 2-6 weeks.  Call your doctor for any of the following:  Excessive vaginal bleeding or clotting, saturating and changing one pad every hour.  Inability to urinate 6 hours after discharge from hospital.  Pain not relieved by pain medication.  Fever of 100.4 F or greater.  Unusual vaginal discharge or odor.  [place] [facility] [phone]

## 2014-03-30 NOTE — Anesthesia Postprocedure Evaluation (Signed)
  Anesthesia Post-op Note  Patient: Carrie Neal  Procedure(s) Performed: Procedure(s) (LRB): DILATATION & CURETTAGE/HYSTEROSCOPY WITH MYOSURE and placement of Mirena Intrauterine device (N/A)  Patient Location: PACU  Anesthesia Type: General  Level of Consciousness: awake and alert   Airway and Oxygen Therapy: Patient Spontanous Breathing  Post-op Pain: mild  Post-op Assessment: Post-op Vital signs reviewed, Patient's Cardiovascular Status Stable, Respiratory Function Stable, Patent Airway and No signs of Nausea or vomiting  Last Vitals:  Filed Vitals:   03/30/14 1230  BP: 143/73  Pulse: 95  Temp:   Resp: 14    Post-op Vital Signs: stable   Complications: No apparent anesthesia complications

## 2014-03-30 NOTE — Op Note (Signed)
  PRE-OP DIAGNOSIS: 1. Abnormal uterine bleeding: Menorrhagia and Prolonged vaginal bleeding. 2. Endometrial lesion suspect endometrial polyp.    POST-OP DIAGNOSIS: Same as above  PROCEDURES: 1. Dilation and Currettage, resection of endometrial polyp using Myosure procedure. 2. Mirena insertion.    Surgeon: Dr. Waymon Amato  Assistant: Scrub Designer, multimedia.   Anesthesiologist: General anesthesia LMA  Complications: None  IV fluid: 800 cc normal saline  Fluid deficit: 410 cc  EBL: 30 cc  Indications: 44 year old para 6 with a history of menorrhagia and prolonged vaginal bleeding who failed oral medical management of her symptoms.   Ultrasound also showed an intracavitary lesion suspicious for an endometrial polyp.   PROCEDURE:  Informed consent was obtained from the patient to undergo the procedure. She was taken to the operating room and anesthesia was administered without difficulty. She was prepped and draped in the usual sterile fashion. She was straight catheterized. An exam was then performed under anesthesia revealing a 12 week sized anteflexed uterus and a closed cervix. There were no palpable adnexal masses.  A speculum was placed in and a single-tooth tenaculum was placed anteriorly on the cervix. The Myosure diagnostic scope was then placed to view the uterine cavity and 2 pink endometrial lesions suspicious for polyps were noted on posterior aspect of uterus wall.   These lesions were resected using the Myosure LITE under direct visualization without any complications. Gentle currette with the myosure lite was also performed to obtain more endometrial specimen from the area near the lesions.The rest of the cavity appeared normal.  The first myosure lite had to be replaced because of a bent tip.  She tolerated the procedure well. Normal tubal ostia were noted bilaterally at the end of the procedure.  No other lesions were noted. The instruments were then removed and the patient  was awoken from anesthesia. She was cleaned and taken to recovery room in stable condition  SPECIMEN: Endometrial lesions suspicious for endometrial polyps. Endometrial currettings via myosure.

## 2014-03-30 NOTE — Anesthesia Preprocedure Evaluation (Signed)
Anesthesia Evaluation  Patient identified by MRN, date of birth, ID band Patient awake    Reviewed: Allergy & Precautions, H&P , Patient's Chart, lab work & pertinent test results, reviewed documented beta blocker date and time   Airway Mallampati: II  TM Distance: >3 FB Neck ROM: full    Dental no notable dental hx.    Pulmonary  breath sounds clear to auscultation  Pulmonary exam normal       Cardiovascular Rhythm:regular Rate:Normal     Neuro/Psych    GI/Hepatic   Endo/Other    Renal/GU      Musculoskeletal   Abdominal   Peds  Hematology  (+) anemia ,   Anesthesia Other Findings   Reproductive/Obstetrics                             Anesthesia Physical Anesthesia Plan  ASA: II  Anesthesia Plan:    Post-op Pain Management:    Induction: Intravenous  Airway Management Planned: LMA  Additional Equipment:   Intra-op Plan:   Post-operative Plan:   Informed Consent: I have reviewed the patients History and Physical, chart, labs and discussed the procedure including the risks, benefits and alternatives for the proposed anesthesia with the patient or authorized representative who has indicated his/her understanding and acceptance.   Dental Advisory Given and Dental advisory given  Plan Discussed with: CRNA and Surgeon  Anesthesia Plan Comments: (Discussed GA with LMA, possible sore throat, potential need to switch to ETT, N/V, pulmonary aspiration. Questions answered. )        Anesthesia Quick Evaluation

## 2014-03-30 NOTE — Transfer of Care (Signed)
Immediate Anesthesia Transfer of Care Note  Patient: Carrie Neal  Procedure(s) Performed: Procedure(s): DILATATION & CURETTAGE/HYSTEROSCOPY WITH MYOSURE and placement of Mirena Intrauterine device (N/A)  Patient Location: PACU  Anesthesia Type:General  Level of Consciousness: awake, alert  and oriented  Airway & Oxygen Therapy: Patient Spontanous Breathing and Patient connected to nasal cannula oxygen  Post-op Assessment: Report given to RN  Post vital signs: Reviewed  Last Vitals:  Filed Vitals:   03/30/14 1019  BP: 130/84  Pulse: 105  Temp: 36.8 C  Resp: 18    Complications: No apparent anesthesia complications

## 2014-03-30 NOTE — H&P (Signed)
Carrie Neal is an 44 y.o. female P6 with history of menorrhagia and recently with prolonged heavy bleeding who has failed medical management with megace, oral contraceptives and premarin who is now for dilation and currettage, possible polypectomy via myosure and Mirena IUD placement. A recent ultrasound showed a focal mass within uterus cavity suspicious for a polyp.  Patient had recently been admitted for anemia and received 4 units PRBC transfusion.    Pertinent Gynecological History: Menses: flow is excessive with use of 8 pads or tampons on heaviest days and most recently has lasted for over one and a half month.  Bleeding: abnormal uterine bleeding   Contraception: tubal ligation DES exposure: unknown Blood transfusions: recently received 4 units PRBCs.  Sexually transmitted diseases: denies Previous GYN Procedures: Endometrial biospy  Last mammogram: normal   Last pap: Negative for atypia r malignancy. + endometrial cells.  Date: 02/2014 OB History: G6, P6   Menstrual History: Patient's last menstrual period was 01/31/2014 (approximate).    Past Medical History  Diagnosis Date  . Dysfunctional uterine bleeding 12/2013  . Anemia     Past Surgical History  Procedure Laterality Date  . Cholecystectomy    . Tubal ligation    . Umbilical hernia repair      as child    History reviewed. No pertinent family history.  Denies family history of   Social History:  reports that she has never smoked. She does not have any smokeless tobacco history on file. She reports that she does not drink alcohol or use illicit drugs.  Allergies:  Allergies  Allergen Reactions  . Codeine Itching    No prescriptions prior to admission  Patient was advised to stop her premarin day before presenting for surgery.   ROS  Gen: Denies fevers/chills. + fatigue. Cardiovascular: Denies chest pain or shortness of breath or palpitations.  Pulmonary: denies coughing or wheezing. Gastrointestinal:  denies nausea/vomiting or diarrhea Genitourinary: Denies dysuria  Last menstrual period 01/31/2014. Physical Exam  Gen: NAD CVS: s1, s2, RRR Pulm: Clease to auscultation bilaterally.  Abd: S/NT/obese/+BS Ext: Warm and well perfused, no edema, no calf tenderness.   No results found for this or any previous visit (from the past 24 hour(s)).  No results found.   Pelvic Ultrasound 03/22/14:   Focal endometrial thickening, suspicious for a possible 2.4 cm endometrial polyp, although without associated vascularity. Consider hysteroscopy for further evaluation.  Bilateral ovarian cysts, measuring up to 5.0 cm in the left ovary. Follow-up pelvic ultrasound is suggested in 6-12 weeks.  Endometrial biospy: Benign endometrial cells.   Assessment/Plan: 44 year old with abnormal uterine bleeding: history of menorrhagia and recently with prolonged heavy menstrual bleeding, for Dilation and currettage, possible polypectomy via myosure, and also Mirena IUD placement -Discussed with patient risks, benefits and alternatives of procedure including risks of bleeding, infection, damage to organs. Also discussed risks of Mirena use including irregular bleeding, amenorrhea.  All questions were answered.    [facility] 03/30/2014, 9:04 AM

## 2014-03-30 NOTE — Brief Op Note (Signed)
03/30/2014  11:15 AM  PATIENT:  Carrie Neal  44 y.o. female  PRE-OPERATIVE DIAGNOSIS:  1. Abnormal uterine bleeding: Menorrhagia and prolonged heavy vaginal bleeding.  2. Intracavitary lesion, suspect endometrial polyp.    POST-OPERATIVE DIAGNOSIS:  Same as above.      PROCEDURES:  Procedure(s): DILATATION & CURETTAGE/HYSTEROSCOPY WITH MYOSURE (N/A)  Mirena Insertion.   SURGEON:  Surgeon(s) and Role:    * Devanny Palecek Karren Burly, MD - Primary  PHYSICIAN ASSISTANT: None  ASSISTANTS: Scrub techinician   ANESTHESIA:   General LMA  EBL:   30cc  BLOOD ADMINISTERED:none  DRAINS: none   LOCAL MEDICATIONS USED:  LIDOCAINE with epi 10 cc paracervical block.    SPECIMEN:  Aspirate  DISPOSITION OF SPECIMEN:  PATHOLOGY  COUNTS:  YES  TOURNIQUET:  * No tourniquets in log *  DICTATION: .Note written in EPIC  PLAN OF CARE: Discharge to home after PACU  PATIENT DISPOSITION:  PACU - hemodynamically stable.   Delay start of Pharmacological VTE agent (>24hrs) due to surgical blood loss or risk of bleeding: not applicable

## 2014-04-02 ENCOUNTER — Encounter (HOSPITAL_COMMUNITY): Payer: Self-pay | Admitting: Obstetrics & Gynecology

## 2014-04-13 ENCOUNTER — Inpatient Hospital Stay (HOSPITAL_COMMUNITY)
Admission: EM | Admit: 2014-04-13 | Discharge: 2014-04-16 | DRG: 811 | Disposition: A | Discharge status: Home / Self Care | Payer: BLUE CROSS/BLUE SHIELD | Attending: Internal Medicine | Admitting: Internal Medicine

## 2014-04-13 ENCOUNTER — Encounter (HOSPITAL_COMMUNITY): Payer: Self-pay | Admitting: Emergency Medicine

## 2014-04-13 DIAGNOSIS — R0602 Shortness of breath: Secondary | ICD-10-CM

## 2014-04-13 DIAGNOSIS — Z79899 Other long term (current) drug therapy: Secondary | ICD-10-CM | POA: Diagnosis not present

## 2014-04-13 DIAGNOSIS — E860 Dehydration: Secondary | ICD-10-CM | POA: Diagnosis present

## 2014-04-13 DIAGNOSIS — Z79891 Long term (current) use of opiate analgesic: Secondary | ICD-10-CM

## 2014-04-13 DIAGNOSIS — Z885 Allergy status to narcotic agent status: Secondary | ICD-10-CM | POA: Diagnosis not present

## 2014-04-13 DIAGNOSIS — R0600 Dyspnea, unspecified: Secondary | ICD-10-CM | POA: Diagnosis present

## 2014-04-13 DIAGNOSIS — D509 Iron deficiency anemia, unspecified: Principal | ICD-10-CM | POA: Diagnosis present

## 2014-04-13 DIAGNOSIS — R110 Nausea: Secondary | ICD-10-CM | POA: Diagnosis present

## 2014-04-13 DIAGNOSIS — E1110 Type 2 diabetes mellitus with ketoacidosis without coma: Secondary | ICD-10-CM | POA: Diagnosis present

## 2014-04-13 DIAGNOSIS — Z791 Long term (current) use of non-steroidal anti-inflammatories (NSAID): Secondary | ICD-10-CM | POA: Diagnosis not present

## 2014-04-13 DIAGNOSIS — Z833 Family history of diabetes mellitus: Secondary | ICD-10-CM | POA: Diagnosis not present

## 2014-04-13 DIAGNOSIS — D649 Anemia, unspecified: Secondary | ICD-10-CM

## 2014-04-13 DIAGNOSIS — E1310 Other specified diabetes mellitus with ketoacidosis without coma: Secondary | ICD-10-CM | POA: Diagnosis present

## 2014-04-13 DIAGNOSIS — R000 Tachycardia, unspecified: Secondary | ICD-10-CM

## 2014-04-13 LAB — COMPREHENSIVE METABOLIC PANEL
ALT: 25 U/L (ref 0–35)
AST: 19 U/L (ref 0–37)
Albumin: 4.2 g/dL (ref 3.5–5.2)
Alkaline Phosphatase: 134 U/L — ABNORMAL HIGH (ref 39–117)
Anion gap: 17 — ABNORMAL HIGH (ref 5–15)
BUN: <5 mg/dL — ABNORMAL LOW (ref 6–23)
CALCIUM: 11.6 mg/dL — AB (ref 8.4–10.5)
CO2: 15 mmol/L — ABNORMAL LOW (ref 19–32)
Chloride: 98 mmol/L (ref 96–112)
Creatinine, Ser: 1.13 mg/dL — ABNORMAL HIGH (ref 0.50–1.10)
GFR calc non Af Amer: 58 mL/min — ABNORMAL LOW (ref >90)
GFR, EST AFRICAN AMERICAN: 67 mL/min — AB (ref >90)
Glucose, Bld: 580 mg/dL (ref 70–99)
Potassium: 4.7 mmol/L (ref 3.5–5.1)
SODIUM: 130 mmol/L — AB (ref 135–145)
TOTAL PROTEIN: 9.2 g/dL — AB (ref 6.0–8.3)
Total Bilirubin: 1 mg/dL (ref 0.3–1.2)

## 2014-04-13 LAB — CBC WITH DIFFERENTIAL/PLATELET
Basophils Absolute: 0 10*3/uL (ref 0.0–0.1)
Basophils Relative: 0 % (ref 0–1)
EOS ABS: 0.1 10*3/uL (ref 0.0–0.7)
EOS PCT: 1 % (ref 0–5)
HCT: 34.6 % — ABNORMAL LOW (ref 36.0–46.0)
HEMOGLOBIN: 10.9 g/dL — AB (ref 12.0–15.0)
LYMPHS ABS: 1.9 10*3/uL (ref 0.7–4.0)
Lymphocytes Relative: 19 % (ref 12–46)
MCH: 23.3 pg — AB (ref 26.0–34.0)
MCHC: 31.5 g/dL (ref 30.0–36.0)
MCV: 73.9 fL — ABNORMAL LOW (ref 78.0–100.0)
MONOS PCT: 6 % (ref 3–12)
Monocytes Absolute: 0.6 10*3/uL (ref 0.1–1.0)
Neutro Abs: 7.6 10*3/uL (ref 1.7–7.7)
Neutrophils Relative %: 74 % (ref 43–77)
Platelets: 454 10*3/uL — ABNORMAL HIGH (ref 150–400)
RBC: 4.68 MIL/uL (ref 3.87–5.11)
RDW: 19.1 % — ABNORMAL HIGH (ref 11.5–15.5)
WBC: 10.2 10*3/uL (ref 4.0–10.5)

## 2014-04-13 LAB — CBG MONITORING, ED
GLUCOSE-CAPILLARY: 261 mg/dL — AB (ref 70–99)
GLUCOSE-CAPILLARY: 244 mg/dL — AB (ref 70–99)
Glucose-Capillary: 558 mg/dL (ref 70–99)
Glucose-Capillary: 436 mg/dL — ABNORMAL HIGH (ref 70–99)
Glucose-Capillary: 370 mg/dL — ABNORMAL HIGH (ref 70–99)
Glucose-Capillary: 282 mg/dL — ABNORMAL HIGH (ref 70–99)

## 2014-04-13 LAB — URINALYSIS, ROUTINE W REFLEX MICROSCOPIC
Bilirubin Urine: NEGATIVE
Ketones, ur: >80 mg/dL — AB
Leukocytes, UA: NEGATIVE
Nitrite: NEGATIVE
PH: 5 (ref 5.0–8.0)
Protein, ur: NEGATIVE mg/dL
Specific Gravity, Urine: 1.038 — ABNORMAL HIGH (ref 1.005–1.030)
Urobilinogen, UA: 0.2 mg/dL (ref 0.0–1.0)

## 2014-04-13 LAB — PHOSPHORUS: Phosphorus: 1.4 mg/dL — ABNORMAL LOW (ref 2.3–4.6)

## 2014-04-13 LAB — URINE MICROSCOPIC-ADD ON

## 2014-04-13 LAB — MAGNESIUM: MAGNESIUM: 2 mg/dL (ref 1.5–2.5)

## 2014-04-13 LAB — TROPONIN I

## 2014-04-13 MED ORDER — DEXTROSE-NACL 5-0.45 % IV SOLN
INTRAVENOUS | Status: DC
  Administered 2014-04-13: via INTRAVENOUS

## 2014-04-13 MED ORDER — OXYCODONE-ACETAMINOPHEN 5-325 MG PO TABS
1.0000 | ORAL_TABLET | ORAL | Status: DC | PRN
  Administered 2014-04-13 – 2014-04-15 (×3): 1 via ORAL
  Filled 2014-04-13 (×3): qty 1

## 2014-04-13 MED ORDER — SODIUM CHLORIDE 0.9 % IV SOLN
1000.0000 mL | INTRAVENOUS | Status: DC
  Administered 2014-04-13: 1000 mL via INTRAVENOUS

## 2014-04-13 MED ORDER — SODIUM CHLORIDE 0.9 % IV SOLN
1000.0000 mL | Freq: Once | INTRAVENOUS | Status: AC
  Administered 2014-04-13: 1000 mL via INTRAVENOUS

## 2014-04-13 MED ORDER — SODIUM CHLORIDE 0.9 % IV SOLN
INTRAVENOUS | Status: DC
  Administered 2014-04-13: 3.8 [IU]/h via INTRAVENOUS
  Filled 2014-04-13 (×2): qty 2.5

## 2014-04-13 NOTE — ED Notes (Signed)
Pt here from PCP with new onset hyperglycemia; pt sts PU/PD x 1week and some blurry vision

## 2014-04-13 NOTE — ED Provider Notes (Signed)
CSN: [id number]     Arrival date & time 04/13/14  1603 History   First MD Initiated Contact with Patient 04/13/14 1715     Chief Complaint  Patient presents with  . Hyperglycemia     (Consider location/radiation/quality/duration/timing/severity/associated sxs/prior Treatment) Patient is a 44 y.o. female presenting with hyperglycemia. The history is provided by the patient.  Hyperglycemia Associated symptoms: increased thirst, nausea, polyuria and shortness of breath   Associated symptoms: no abdominal pain and no chest pain    patient was sent in by her gynecologist for hyperglycemia. Around 2 weeks ago she had a procedure for uterine bleeding. She states her on 1 week ago she began to have urinary frequency. She states she had to get up numerous times at night. She was thirsty all the time 2. No chest pain but has had some shortness of breath. No fevers. She states her vision has been more blurred also. Initial sugar was reportedly around 700. Sent to the ER for further workup. Patient is not a known diabetic.  Past Medical History  Diagnosis Date  . Dysfunctional uterine bleeding 12/2013  . Anemia    Past Surgical History  Procedure Laterality Date  . Cholecystectomy    . Tubal ligation    . Umbilical hernia repair      as child  . Dilatation & curettage/hysteroscopy with myosure N/A 03/30/2014    Procedure: DILATATION & CURETTAGE/HYSTEROSCOPY WITH MYOSURE and placement of Mirena Intrauterine device;  Surgeon: Alinda Dooms, MD;  Location: [facility];  Service: Gynecology;  Laterality: N/A;   Family History  Problem Relation Age of Onset  . Diabetes Mother   . Diabetes Cousin   . Diabetes Maternal Grandmother    History  Substance Use Topics  . Smoking status: Never Smoker   . Smokeless tobacco: Not on file  . Alcohol Use: No   OB History    Gravida Para Term Preterm AB TAB SAB Ectopic Multiple Living       0  0   6     Review of Systems  Constitutional: Positive for  appetite change.  HENT: Negative for facial swelling.   Eyes: Positive for visual disturbance.  Respiratory: Positive for shortness of breath.   Cardiovascular: Negative for chest pain.  Gastrointestinal: Positive for nausea. Negative for abdominal pain.  Endocrine: Positive for polydipsia, polyphagia and polyuria.  Genitourinary: Positive for frequency.  Musculoskeletal: Negative for back pain.  Skin: Negative for wound.      Allergies  Codeine  Home Medications   Prior to Admission medications   Medication Sig Start Date End Date Taking? Authorizing Provider  Cyanocobalamin (B-12 PO) Take 1 tablet by mouth daily.    Yes Historical Provider, MD  ferrous sulfate 325 (65 FE) MG tablet Take 1 tablet (325 mg total) by mouth 3 (three) times daily with meals. Patient taking differently: Take 325 mg by mouth daily with breakfast.  03/01/14  Yes Nicole Pisciotta, PA-C  ibuprofen (ADVIL,MOTRIN) 600 MG tablet Take 1 tablet (600 mg total) by mouth every 6 (six) hours as needed for mild pain or cramping. 03/30/14  Yes Ema Karren Burly, MD  Multiple Vitamin (MULTIVITAMIN WITH MINERALS) TABS tablet Take 1 tablet by mouth daily.   Yes Historical Provider, MD  oxyCODONE-acetaminophen (ROXICET) 5-325 MG per tablet Take 1 tablet by mouth every 4 (four) hours as needed for moderate pain or severe pain. 03/30/14  Yes Ema Karren Burly, MD   BP 109/72 mmHg  Pulse 115  Temp(Src)  98.4 F (36.9 C) (Oral)  Resp 20  SpO2 100%  LMP 01/31/2014 (Approximate) Physical Exam  Constitutional: She appears well-developed.  Patient is obese  HENT:  Head: Normocephalic.  Mucous membranes are somewhat dry  Cardiovascular:  Tachycardia  Pulmonary/Chest: Effort normal.  Abdominal: There is no tenderness.  Musculoskeletal: Normal range of motion.  Neurological: She is alert.    ED Course  Procedures (including critical care time) Labs Review Labs Reviewed  COMPREHENSIVE METABOLIC PANEL - Abnormal; Notable  for the following:    Sodium 130 (*)    CO2 15 (*)    Glucose, Bld 580 (*)    BUN <5 (*)    Creatinine, Ser 1.13 (*)    Calcium 11.6 (*)    Total Protein 9.2 (*)    Alkaline Phosphatase 134 (*)    GFR calc non Af Amer 58 (*)    GFR calc Af Amer 67 (*)    Anion gap 17 (*)    All other components within normal limits  URINALYSIS, ROUTINE W REFLEX MICROSCOPIC - Abnormal; Notable for the following:    Specific Gravity, Urine 1.038 (*)    Glucose, UA >1000 (*)    Hgb urine dipstick SMALL (*)    Ketones, ur >80 (*)    All other components within normal limits  CBC WITH DIFFERENTIAL/PLATELET - Abnormal; Notable for the following:    Hemoglobin 10.9 (*)    HCT 34.6 (*)    MCV 73.9 (*)    MCH 23.3 (*)    RDW 19.1 (*)    Platelets 454 (*)    All other components within normal limits  URINE MICROSCOPIC-ADD ON - Abnormal; Notable for the following:    Squamous Epithelial / LPF FEW (*)    All other components within normal limits  CBG MONITORING, ED - Abnormal; Notable for the following:    Glucose-Capillary 558 (*)    All other components within normal limits  CBG MONITORING, ED - Abnormal; Notable for the following:    Glucose-Capillary 436 (*)    All other components within normal limits  CBG MONITORING, ED - Abnormal; Notable for the following:    Glucose-Capillary 370 (*)    All other components within normal limits  CBG MONITORING, ED - Abnormal; Notable for the following:    Glucose-Capillary 282 (*)    All other components within normal limits  MAGNESIUM  PHOSPHORUS  PARATHYROID HORMONE, INTACT (NO CA)  PTH-RELATED PEPTIDE  TSH  TROPONIN I  TROPONIN I  TROPONIN I  VITAMIN D 1,25 DIHYDROXY  VITAMIN D 25 HYDROXY  PROTEIN ELECTROPHORESIS, SERUM  IMMUNOFIXATION ELECTROPHORESIS, URINE (WITH TOT PROT)  CALCIUM, IONIZED    Imaging Review No results found.   EKG Interpretation None      MDM   Final diagnoses:  Diabetic ketoacidosis without coma associated with  type 2 diabetes mellitus    Patient with new onset diabetes and a somewhat mild DKA. She is hypertensive. Does have anion gap greater than 80 ketones in the urine. Will admit to step down bed to internal medicine.    Jasper Riling. Alvino Chapel, MD 04/13/14 2123

## 2014-04-13 NOTE — ED Notes (Signed)
CRITICAL VALUE ALERT  Critical value received:  Blood sugar 580  Date of notification:  04/13/2014  Time of notification:  1730  Critical value read back:yes  Dr. Alvino Chapel made aware.

## 2014-04-13 NOTE — H&P (Addendum)
Carrie Neal is an 44 y.o. female.    Dr. Garfield Cornea (ob-gyn) pcp unassigned  Chief Complaint: polyuria, polydipsia HPI: 44 yo female with anemia apparently had symptoms of blurred vision and dry mouth for the past week along with polyuria, and polydipsia.  + nausea,  No emesis.  Pt had bs tested by her ob-gyn  >700 and sent to ED for evaluation and noted to be in DKA. Pt will be admitted for dka  Past Medical History  Diagnosis Date  . Dysfunctional uterine bleeding 12/2013  . Anemia     Past Surgical History  Procedure Laterality Date  . Cholecystectomy    . Tubal ligation    . Umbilical hernia repair      as child  . Dilatation & curettage/hysteroscopy with myosure N/A 03/30/2014    Procedure: DILATATION & CURETTAGE/HYSTEROSCOPY WITH MYOSURE and placement of Mirena Intrauterine device;  Surgeon: Alinda Dooms, MD;  Location: [facility];  Service: Gynecology;  Laterality: N/A;    Family History  Problem Relation Age of Onset  . Diabetes Mother   . Diabetes Cousin   . Diabetes Maternal Grandmother    Social History:  reports that she has never smoked. She does not have any smokeless tobacco history on file. She reports that she does not drink alcohol or use illicit drugs.  Allergies:  Allergies  Allergen Reactions  . Codeine Itching     (Not in a hospital admission)  Results for orders placed or performed during the hospital encounter of 04/13/14 (from the past 48 hour(s))  CBG monitoring, ED     Status: Abnormal   Collection Time: 04/13/14  4:07 PM  Result Value Ref Range   Glucose-Capillary 558 (HH) 70 - 99 mg/dL   Comment 1 Notify RN   Comprehensive metabolic panel     Status: Abnormal   Collection Time: 04/13/14  4:34 PM  Result Value Ref Range   Sodium 130 (L) 135 - 145 mmol/L   Potassium 4.7 3.5 - 5.1 mmol/L   Chloride 98 96 - 112 mmol/L   CO2 15 (L) 19 - 32 mmol/L   Glucose, Bld 580 (HH) 70 - 99 mg/dL    Comment: REPEATED TO VERIFY CRITICAL RESULT CALLED TO, READ  BACK BY AND VERIFIED WITH: J NEGERON,RN 02.26.16 1728 M SHIPMAN    BUN <5 (L) 6 - 23 mg/dL   Creatinine, Ser 1.13 (H) 0.50 - 1.10 mg/dL   Calcium 11.6 (H) 8.4 - 10.5 mg/dL   Total Protein 9.2 (H) 6.0 - 8.3 g/dL   Albumin 4.2 3.5 - 5.2 g/dL   AST 19 0 - 37 U/L   ALT 25 0 - 35 U/L   Alkaline Phosphatase 134 (H) 39 - 117 U/L   Total Bilirubin 1.0 0.3 - 1.2 mg/dL   GFR calc non Af Amer 58 (L) >90 mL/min   GFR calc Af Amer 67 (L) >90 mL/min    Comment: (NOTE) The eGFR has been calculated using the CKD EPI equation. This calculation has not been validated in all clinical situations. eGFR's persistently <90 mL/min signify possible Chronic Kidney Disease.    Anion gap 17 (H) 5 - 15  CBC with Differential     Status: Abnormal   Collection Time: 04/13/14  4:34 PM  Result Value Ref Range   WBC 10.2 4.0 - 10.5 K/uL   RBC 4.68 3.87 - 5.11 MIL/uL   Hemoglobin 10.9 (L) 12.0 - 15.0 g/dL   HCT 34.6 (L) 36.0 - 46.0 %  MCV 73.9 (L) 78.0 - 100.0 fL   MCH 23.3 (L) 26.0 - 34.0 pg   MCHC 31.5 30.0 - 36.0 g/dL   RDW 19.1 (H) 11.5 - 15.5 %   Platelets 454 (H) 150 - 400 K/uL   Neutrophils Relative % 74 43 - 77 %   Neutro Abs 7.6 1.7 - 7.7 K/uL   Lymphocytes Relative 19 12 - 46 %   Lymphs Abs 1.9 0.7 - 4.0 K/uL   Monocytes Relative 6 3 - 12 %   Monocytes Absolute 0.6 0.1 - 1.0 K/uL   Eosinophils Relative 1 0 - 5 %   Eosinophils Absolute 0.1 0.0 - 0.7 K/uL   Basophils Relative 0 0 - 1 %   Basophils Absolute 0.0 0.0 - 0.1 K/uL  Urinalysis, Routine w reflex microscopic     Status: Abnormal   Collection Time: 04/13/14  4:40 PM  Result Value Ref Range   Color, Urine YELLOW YELLOW   APPearance CLEAR CLEAR   Specific Gravity, Urine 1.038 (H) 1.005 - 1.030   pH 5.0 5.0 - 8.0   Glucose, UA >1000 (A) NEGATIVE mg/dL   Hgb urine dipstick SMALL (A) NEGATIVE   Bilirubin Urine NEGATIVE NEGATIVE   Ketones, ur >80 (A) NEGATIVE mg/dL   Protein, ur NEGATIVE NEGATIVE mg/dL   Urobilinogen, UA 0.2 0.0 - 1.0  mg/dL   Nitrite NEGATIVE NEGATIVE   Leukocytes, UA NEGATIVE NEGATIVE  Urine microscopic-add on     Status: Abnormal   Collection Time: 04/13/14  4:40 PM  Result Value Ref Range   Squamous Epithelial / LPF FEW (A) RARE   RBC / HPF 3-6 <3 RBC/hpf   Bacteria, UA RARE RARE  CBG monitoring, ED     Status: Abnormal   Collection Time: 04/13/14  6:38 PM  Result Value Ref Range   Glucose-Capillary 436 (H) 70 - 99 mg/dL   Comment 1 Notify RN    No results found.  Review of Systems  Constitutional: Negative for fever, chills, weight loss, malaise/fatigue and diaphoresis.  HENT: Negative for congestion, ear discharge, ear pain, hearing loss, nosebleeds, sore throat and tinnitus.   Eyes: Negative for blurred vision, double vision, photophobia, pain, discharge and redness.  Respiratory: Negative for cough, hemoptysis, sputum production, shortness of breath, wheezing and stridor.   Cardiovascular: Positive for palpitations. Negative for chest pain, orthopnea, claudication, leg swelling and PND.  Gastrointestinal: Positive for nausea. Negative for heartburn, vomiting, abdominal pain, diarrhea, constipation, blood in stool and melena.  Genitourinary: Negative for dysuria, urgency, frequency, hematuria and flank pain.  Musculoskeletal: Negative for myalgias, back pain, joint pain, falls and neck pain.  Skin: Negative for itching and rash.  Neurological: Positive for weakness. Negative for dizziness, tingling, tremors, sensory change, speech change, focal weakness, seizures, loss of consciousness and headaches.  Endo/Heme/Allergies: Negative for environmental allergies and polydipsia. Does not bruise/bleed easily.  Psychiatric/Behavioral: Negative for depression, suicidal ideas, hallucinations, memory loss and substance abuse. The patient is not nervous/anxious and does not have insomnia.     Blood pressure 134/88, pulse 122, temperature 98.4 F (36.9 C), temperature source Oral, resp. rate 20, last  menstrual period 01/31/2014, SpO2 97 %. Physical Exam  Constitutional: She is oriented to person, place, and time. She appears well-developed and well-nourished.  HENT:  Head: Normocephalic and atraumatic.  Mouth/Throat: No oropharyngeal exudate.  Dry mouth  Eyes: Conjunctivae and EOM are normal. Pupils are equal, round, and reactive to light. No scleral icterus.  Neck: Normal range of motion. Neck  supple. No JVD present. No tracheal deviation present. No thyromegaly present.  Cardiovascular: Regular rhythm.  Exam reveals no gallop and no friction rub.   No murmur heard. Tachycardia s1, s2  Respiratory: Effort normal and breath sounds normal. No respiratory distress. She has no wheezes. She has no rales.  GI: Soft. Bowel sounds are normal. She exhibits no distension. There is no tenderness. There is no rebound and no guarding.  Musculoskeletal: Normal range of motion. She exhibits no edema or tenderness.  Lymphadenopathy:    She has no cervical adenopathy.  Neurological: She is alert and oriented to person, place, and time. She has normal reflexes. She displays normal reflexes. No cranial nerve deficit. She exhibits normal muscle tone. Coordination normal.  Skin: Skin is warm and dry. No rash noted. No erythema. No pallor.  Psychiatric: She has a normal mood and affect. Her behavior is normal. Judgment and thought content normal.     Assessment/Plan dka Ns iv Iv insulin, per dka protocol  Hypercalcemia Check ionized calcium Check mag, phos, pth, pth rp, tsh, vitamin D 25-oh, 1, 25-oh, spep, upep  Tachycardia Check tsh Check trop i q6h x3 Consider cardiac echo  Anemia Check cbc in am Consider iron studies, b12, folate,   Nausea zofran iv  DVt prophylaxis  Scd, and lovenox  Critical care time 40 minutes  Nayelie Gionfriddo 04/13/2014, 7:19 PM

## 2014-04-13 NOTE — ED Notes (Signed)
   CBG 436   

## 2014-04-13 NOTE — ED Notes (Signed)
Pt ambulated to restroom with steady gait.

## 2014-04-14 ENCOUNTER — Other Ambulatory Visit (HOSPITAL_COMMUNITY): Payer: Self-pay

## 2014-04-14 ENCOUNTER — Inpatient Hospital Stay (HOSPITAL_COMMUNITY): Payer: BLUE CROSS/BLUE SHIELD

## 2014-04-14 LAB — BASIC METABOLIC PANEL
ANION GAP: 5 (ref 5–15)
Anion gap: 8 (ref 5–15)
BUN: 6 mg/dL (ref 6–23)
BUN: 6 mg/dL (ref 6–23)
CO2: 19 mmol/L (ref 19–32)
CO2: 21 mmol/L (ref 19–32)
CREATININE: 0.58 mg/dL (ref 0.50–1.10)
Calcium: 10.6 mg/dL — ABNORMAL HIGH (ref 8.4–10.5)
Calcium: 10.4 mg/dL (ref 8.4–10.5)
Chloride: 109 mmol/L (ref 96–112)
Chloride: 106 mmol/L (ref 96–112)
Creatinine, Ser: 0.62 mg/dL (ref 0.50–1.10)
GFR calc non Af Amer: >90 mL/min (ref >90)
GFR calc non Af Amer: >90 mL/min (ref >90)
Glucose, Bld: 160 mg/dL — ABNORMAL HIGH (ref 70–99)
Glucose, Bld: 157 mg/dL — ABNORMAL HIGH (ref 70–99)
POTASSIUM: 3.4 mmol/L — AB (ref 3.5–5.1)
Potassium: 3.7 mmol/L (ref 3.5–5.1)
Sodium: 133 mmol/L — ABNORMAL LOW (ref 135–145)
Sodium: 135 mmol/L (ref 135–145)

## 2014-04-14 LAB — IRON AND TIBC
Iron: 29 ug/dL — ABNORMAL LOW (ref 42–145)
Saturation Ratios: 8 % — ABNORMAL LOW (ref 20–55)
TIBC: 382 ug/dL (ref 250–470)
UIBC: 353 ug/dL (ref 125–400)

## 2014-04-14 LAB — TROPONIN I
Troponin I: <0.03 ng/mL (ref <0.031)
Troponin I: <0.03 ng/mL (ref <0.031)

## 2014-04-14 LAB — CBG MONITORING, ED
GLUCOSE-CAPILLARY: 223 mg/dL — AB (ref 70–99)
GLUCOSE-CAPILLARY: 158 mg/dL — AB (ref 70–99)
GLUCOSE-CAPILLARY: 139 mg/dL — AB (ref 70–99)
GLUCOSE-CAPILLARY: 138 mg/dL — AB (ref 70–99)
Glucose-Capillary: 128 mg/dL — ABNORMAL HIGH (ref 70–99)
Glucose-Capillary: 150 mg/dL — ABNORMAL HIGH (ref 70–99)
Glucose-Capillary: 182 mg/dL — ABNORMAL HIGH (ref 70–99)
Glucose-Capillary: 188 mg/dL — ABNORMAL HIGH (ref 70–99)
Glucose-Capillary: 225 mg/dL — ABNORMAL HIGH (ref 70–99)
Glucose-Capillary: 252 mg/dL — ABNORMAL HIGH (ref 70–99)
Glucose-Capillary: 279 mg/dL — ABNORMAL HIGH (ref 70–99)

## 2014-04-14 LAB — TSH: TSH: 1.721 u[IU]/mL (ref 0.350–4.500)

## 2014-04-14 LAB — GLUCOSE, CAPILLARY: GLUCOSE-CAPILLARY: 287 mg/dL — AB (ref 70–99)

## 2014-04-14 LAB — CALCIUM, IONIZED: Calcium, Ion: 1.66 mmol/L — ABNORMAL HIGH (ref 1.12–1.32)

## 2014-04-14 MED ORDER — ENOXAPARIN SODIUM 40 MG/0.4ML ~~LOC~~ SOLN
40.0000 mg | SUBCUTANEOUS | Status: DC
  Administered 2014-04-14 – 2014-04-15 (×2): 40 mg via SUBCUTANEOUS
  Filled 2014-04-14: qty 0.4

## 2014-04-14 MED ORDER — SODIUM CHLORIDE 0.9 % IV SOLN: INTRAVENOUS | Status: DC

## 2014-04-14 MED ORDER — POTASSIUM CHLORIDE IN NACL 20-0.9 MEQ/L-% IV SOLN
INTRAVENOUS | Status: DC
  Administered 2014-04-14 – 2014-04-15 (×4): via INTRAVENOUS
  Administered 2014-04-16: 1000 mL via INTRAVENOUS
  Administered 2014-04-16: 11:00:00 via INTRAVENOUS
  Filled 2014-04-14 (×9): qty 1000

## 2014-04-14 MED ORDER — POTASSIUM CHLORIDE 10 MEQ/100ML IV SOLN
10.0000 meq | INTRAVENOUS | Status: AC
  Administered 2014-04-14 (×2): 10 meq via INTRAVENOUS
  Filled 2014-04-14 (×2): qty 100

## 2014-04-14 MED ORDER — DOCUSATE SODIUM 100 MG PO CAPS
100.0000 mg | ORAL_CAPSULE | Freq: Two times a day (BID) | ORAL | Status: DC
  Administered 2014-04-14 – 2014-04-16 (×4): 100 mg via ORAL
  Filled 2014-04-14 (×5): qty 1

## 2014-04-14 MED ORDER — INSULIN ASPART 100 UNIT/ML ~~LOC~~ SOLN
3.0000 [IU] | Freq: Once | SUBCUTANEOUS | Status: AC
  Administered 2014-04-14: 3 [IU] via SUBCUTANEOUS
  Filled 2014-04-14: qty 1

## 2014-04-14 MED ORDER — DEXTROSE-NACL 5-0.45 % IV SOLN: INTRAVENOUS | Status: DC

## 2014-04-14 MED ORDER — SODIUM CHLORIDE 0.9 % IV SOLN
INTRAVENOUS | Status: DC
  Administered 2014-04-14: 1000 mL via INTRAVENOUS

## 2014-04-14 MED ORDER — INSULIN ASPART 100 UNIT/ML ~~LOC~~ SOLN
0.0000 [IU] | Freq: Three times a day (TID) | SUBCUTANEOUS | Status: DC
  Administered 2014-04-15 – 2014-04-16 (×5): 5 [IU] via SUBCUTANEOUS

## 2014-04-14 MED ORDER — K PHOS MONO-SOD PHOS DI & MONO 155-852-130 MG PO TABS
500.0000 mg | ORAL_TABLET | Freq: Two times a day (BID) | ORAL | Status: DC
  Administered 2014-04-14 – 2014-04-16 (×5): 500 mg via ORAL
  Filled 2014-04-14 (×8): qty 2

## 2014-04-14 MED ORDER — INSULIN ASPART 100 UNIT/ML ~~LOC~~ SOLN
0.0000 [IU] | Freq: Every day | SUBCUTANEOUS | Status: DC
  Administered 2014-04-14: 3 [IU] via SUBCUTANEOUS
  Administered 2014-04-15: 5 [IU] via SUBCUTANEOUS

## 2014-04-14 MED ORDER — INSULIN ASPART 100 UNIT/ML ~~LOC~~ SOLN
0.0000 [IU] | Freq: Three times a day (TID) | SUBCUTANEOUS | Status: DC
  Administered 2014-04-14: 5 [IU] via SUBCUTANEOUS
  Administered 2014-04-14: 7 [IU] via SUBCUTANEOUS
  Filled 2014-04-14: qty 1

## 2014-04-14 MED ORDER — PNEUMOCOCCAL VAC POLYVALENT 25 MCG/0.5ML IJ INJ
0.5000 mL | INJECTION | INTRAMUSCULAR | Status: AC
  Administered 2014-04-15: 0.5 mL via INTRAMUSCULAR
  Filled 2014-04-14: qty 0.5

## 2014-04-14 MED ORDER — INSULIN GLARGINE 100 UNIT/ML ~~LOC~~ SOLN
5.0000 [IU] | Freq: Every day | SUBCUTANEOUS | Status: DC
  Administered 2014-04-14 – 2014-04-15 (×2): 5 [IU] via SUBCUTANEOUS
  Filled 2014-04-14 (×2): qty 0.05

## 2014-04-14 MED ORDER — SODIUM CHLORIDE 0.9 % IV SOLN
INTRAVENOUS | Status: DC
  Filled 2014-04-14: qty 2.5

## 2014-04-14 NOTE — Progress Notes (Signed)
Urine for proteinelectropheresis sent to lab.

## 2014-04-14 NOTE — Progress Notes (Signed)
[facility] PROGRESS NOTE  Carrie Neal [id number] DOB: 07/24/1970 DOA: 04/13/2014 PCP: Cleda Mccreedy  Assessment/Plan: 44 y/o female with PMH of Anemia presented with polydipsia, polyuria, nausea, and som blurry vision and found to have DKA  1. DKA, resolved with IVF, IV insulin; transition to SQ insulin; replace lytes, phosphorus  2. DM new Dx; pend Ha1c; cont ISS+ lantus for now 3. Dehydration, resolving with IVF 4. DOE, tachycardia of unclear etiology; obtain CXR, ECG, echo; troponin's are negative, denies acute chest pains  5. Anemia, likely IDA; no s/s of bleeding; check iron profile    Code Status: full Family Communication: d/w patient, her family at the bedside (indicate person spoken with, relationship, and if by phone, the number) Disposition Plan: home pend clinical improvement    Consultants:  none  Procedures:  none  Antibiotics:  noe (indicate start date, and stop date if known)  HPI/Subjective: alert  Objective: Filed Vitals:   04/14/14 0845  BP: 107/44  Pulse: 103  Temp:   Resp:    No intake or output data in the 24 hours ending 04/14/14 1353 There were no vitals filed for this visit.  Exam:   General:  alert  Cardiovascular: s1,s2 rrr  Respiratory: CAT BL  Abdomen: soft, nt, nd  Musculoskeletal: no Leg edema   Data Reviewed: Basic Metabolic Panel:  Recent Labs Lab 04/13/14 1634 04/13/14 2156 04/14/14 0428 04/14/14 0821  NA 130*  --  133* 135  K 4.7  --  3.4* 3.7  CL 98  --  109 106  CO2 15*  --  19 21  GLUCOSE 580*  --  160* 157*  BUN <5*  --  6 6  CREATININE 1.13*  --  0.58 0.62  CALCIUM 11.6*  --  10.6* 10.4  MG  --  2.0  --   --   PHOS  --  1.4*  --   --    Liver Function Tests:  Recent Labs Lab 04/13/14 1634  AST 19  ALT 25  ALKPHOS 134*  BILITOT 1.0  PROT 9.2*  ALBUMIN 4.2   No results for input(s): LIPASE, AMYLASE in the last 168 hours. No results for input(s): AMMONIA in the last 168  hours. CBC:  Recent Labs Lab 04/13/14 1634  WBC 10.2  NEUTROABS 7.6  HGB 10.9*  HCT 34.6*  MCV 73.9*  PLT 454*   Cardiac Enzymes:  Recent Labs Lab 04/13/14 2156 04/14/14 0219 04/14/14 0821  TROPONINI <0.03 <0.03 <0.03   BNP (last 3 results) No results for input(s): BNP in the last 8760 hours.  ProBNP (last 3 results) No results for input(s): PROBNP in the last 8760 hours.  CBG:  Recent Labs Lab 04/14/14 0625 04/14/14 0730 04/14/14 0847 04/14/14 1018 04/14/14 1146  GLUCAP 139* 138* 150* 252* 279*    No results found for this or any previous visit (from the past 240 hour(s)).   Studies: No results found.  Scheduled Meds: . enoxaparin (LOVENOX) injection  40 mg Subcutaneous Q24H  . insulin aspart  0-9 Units Subcutaneous TID WC  . insulin glargine  5 Units Subcutaneous Daily   Continuous Infusions: . 0.9 % NaCl with KCl 20 mEq / L 100 mL/hr at 04/14/14 1145    Active Problems:   Anemia   DKA (diabetic ketoacidoses)   Tachycardia   Hypercalcemia    Time spent: >35 minutes     Kinnie Feil  [facility] Pager [phone]. If 7PM-7AM, please contact night-coverage at [email or web address], password  [email or web address] 04/14/2014, 1:53 PM  LOS: 1 day

## 2014-04-14 NOTE — Progress Notes (Signed)
CRITICAL VALUE ALERT  Critical value received:  Ionized calcium 1.66  Date of notification:  04/14/14  Time of notification:  18:46  Critical value read back: yes  Nurse who received alert:  Doreene Burke  MD notified (1st page):  Daralene Milch  Time of first page:  18:56  MD notified (2nd page):  Time of second page:  Responding MD:    Time MD responded:

## 2014-04-14 NOTE — ED Notes (Signed)
Provided the patient with sanitary pads a washcloths.  She walked to the bathroom on her own with no problems.

## 2014-04-14 NOTE — Progress Notes (Signed)
Patient arrived on unit via stretcher from ED.  Patient alert and oriented, family at bedside.  Telemetry placed per MD order.

## 2014-04-14 NOTE — ED Notes (Signed)
Flow made aware of new bed request for pt.

## 2014-04-15 DIAGNOSIS — R0600 Dyspnea, unspecified: Secondary | ICD-10-CM

## 2014-04-15 LAB — BASIC METABOLIC PANEL
ANION GAP: 9 (ref 5–15)
BUN: <5 mg/dL — ABNORMAL LOW (ref 6–23)
CO2: 17 mmol/L — ABNORMAL LOW (ref 19–32)
Calcium: 9.8 mg/dL (ref 8.4–10.5)
Chloride: 110 mmol/L (ref 96–112)
Creatinine, Ser: 0.6 mg/dL (ref 0.50–1.10)
Glucose, Bld: 322 mg/dL — ABNORMAL HIGH (ref 70–99)
POTASSIUM: 4.1 mmol/L (ref 3.5–5.1)
Sodium: 136 mmol/L (ref 135–145)

## 2014-04-15 LAB — GLUCOSE, CAPILLARY
GLUCOSE-CAPILLARY: 353 mg/dL — AB (ref 70–99)
Glucose-Capillary: 282 mg/dL — ABNORMAL HIGH (ref 70–99)
Glucose-Capillary: 277 mg/dL — ABNORMAL HIGH (ref 70–99)
Glucose-Capillary: 275 mg/dL — ABNORMAL HIGH (ref 70–99)

## 2014-04-15 LAB — PARATHYROID HORMONE, INTACT (NO CA)

## 2014-04-15 LAB — FERRITIN: Ferritin: 17 ng/mL (ref 10–291)

## 2014-04-15 MED ORDER — POLYETHYLENE GLYCOL 3350 17 G PO PACK
17.0000 g | PACK | Freq: Every day | ORAL | Status: DC
  Administered 2014-04-15 – 2014-04-16 (×2): 17 g via ORAL
  Filled 2014-04-15 (×2): qty 1

## 2014-04-15 MED ORDER — FERROUS SULFATE 325 (65 FE) MG PO TABS
325.0000 mg | ORAL_TABLET | Freq: Two times a day (BID) | ORAL | Status: DC
  Administered 2014-04-15: 325 mg via ORAL
  Filled 2014-04-15 (×4): qty 1

## 2014-04-15 MED ORDER — INSULIN GLARGINE 100 UNIT/ML ~~LOC~~ SOLN
10.0000 [IU] | Freq: Every day | SUBCUTANEOUS | Status: DC
  Administered 2014-04-16: 10 [IU] via SUBCUTANEOUS
  Filled 2014-04-15: qty 0.1

## 2014-04-15 MED ORDER — INSULIN ASPART 100 UNIT/ML ~~LOC~~ SOLN
3.0000 [IU] | Freq: Three times a day (TID) | SUBCUTANEOUS | Status: DC
  Administered 2014-04-15 – 2014-04-16 (×2): 3 [IU] via SUBCUTANEOUS

## 2014-04-15 NOTE — Progress Notes (Signed)
UR Completed.  [phone]  

## 2014-04-15 NOTE — Progress Notes (Signed)
  Echocardiogram 2D Echocardiogram has been performed.  Avanell Shackleton M 04/15/2014, 10:17 AM

## 2014-04-15 NOTE — Progress Notes (Signed)
[facility] PROGRESS NOTE  Carrie Neal OAC:[id number] DOB: September 05, 1970 DOA: 04/13/2014 PCP: Cleda Mccreedy  Assessment/Plan: 44 y/o female with PMH of Anemia presented with polydipsia, polyuria, nausea, and som blurry vision and found to have DKA  1. DKA, resolved with IVF, IV insulin; transition to SQ insulin; replace lytes, phosphorus  2. DM new Dx; pend Ha1c; cont ISS+ lantus for now; added mealtime insulin 3 unit; DM education consult  3. Dehydration, resolving with IVF 4. DOE, tachycardia of unclear etiology; CXR: unremarkable; ECG: NSR; pend- echo; troponin's are negative, denies acute chest pains  5. Anemia, IDA; no s/s of acute bleeding; start PO iron    Code Status: full Family Communication: d/w patient, her family at the bedside (indicate person spoken with, relationship, and if by phone, the number) Disposition Plan: home pend clinical improvement    Consultants:  none  Procedures:  none  Antibiotics:  noe (indicate start date, and stop date if known)  HPI/Subjective: alert  Objective: Filed Vitals:   04/15/14 1000  BP: 147/90  Pulse: 98  Temp: 98.6 F (37 C)  Resp: 18    Intake/Output Summary (Last 24 hours) at 04/15/14 1205 Last data filed at 04/15/14 1100  Gross per 24 hour  Intake 1558.33 ml  Output    900 ml  Net 658.33 ml   Filed Weights   04/14/14 1555 04/14/14 2128  Weight: 116.9 kg (257 lb 11.5 oz) 126.327 kg (278 lb 8 oz)    Exam:   General:  alert  Cardiovascular: s1,s2 rrr  Respiratory: CAT BL  Abdomen: soft, nt, nd  Musculoskeletal: no Leg edema   Data Reviewed: Basic Metabolic Panel:  Recent Labs Lab 04/13/14 1634 04/13/14 2156 04/14/14 0428 04/14/14 0821 04/15/14 0500  NA 130*  --  133* 135 136  K 4.7  --  3.4* 3.7 4.1  CL 98  --  109 106 110  CO2 15*  --  19 21 17*  GLUCOSE 580*  --  160* 157* 322*  BUN <5*  --  6 6 <5*  CREATININE 1.13*  --  0.58 0.62 0.60  CALCIUM 11.6*  --  10.6* 10.4 9.8   MG  --  2.0  --   --   --   PHOS  --  1.4*  --   --   --    Liver Function Tests:  Recent Labs Lab 04/13/14 1634  AST 19  ALT 25  ALKPHOS 134*  BILITOT 1.0  PROT 9.2*  ALBUMIN 4.2   No results for input(s): LIPASE, AMYLASE in the last 168 hours. No results for input(s): AMMONIA in the last 168 hours. CBC:  Recent Labs Lab 04/13/14 1634  WBC 10.2  NEUTROABS 7.6  HGB 10.9*  HCT 34.6*  MCV 73.9*  PLT 454*   Cardiac Enzymes:  Recent Labs Lab 04/13/14 2156 04/14/14 0219 04/14/14 0821  TROPONINI <0.03 <0.03 <0.03   BNP (last 3 results) No results for input(s): BNP in the last 8760 hours.  ProBNP (last 3 results) No results for input(s): PROBNP in the last 8760 hours.  CBG:  Recent Labs Lab 04/14/14 1018 04/14/14 1146 04/14/14 2123 04/15/14 0747 04/15/14 1147  GLUCAP 252* 279* 287* 282* 277*    No results found for this or any previous visit (from the past 240 hour(s)).   Studies: Dg Chest 2 View  04/14/2014   CLINICAL DATA:  Shortness of breath for 1 week.  EXAM: CHEST  2 VIEW  COMPARISON:  10/22/2012  FINDINGS: The heart size and mediastinal contours are within normal limits. Both lungs are clear. The visualized skeletal structures are unremarkable.  IMPRESSION: No active cardiopulmonary disease.   Electronically Signed   By: Rolm Baptise M.D.   On: 04/14/2014 15:32    Scheduled Meds: . docusate sodium  100 mg Oral BID  . enoxaparin (LOVENOX) injection  40 mg Subcutaneous Q24H  . insulin aspart  0-5 Units Subcutaneous QHS  . insulin aspart  0-9 Units Subcutaneous TID WC  . insulin glargine  5 Units Subcutaneous Daily  . phosphorus  500 mg Oral BID   Continuous Infusions: . 0.9 % NaCl with KCl 20 mEq / L 125 mL/hr at 04/15/14 7290    Active Problems:   Anemia   DKA (diabetic ketoacidoses)   Tachycardia   Hypercalcemia    Time spent: >35 minutes     Kinnie Feil  [facility] Pager [phone]. If 7PM-7AM, please contact  night-coverage at [email or web address], password [facility] 04/15/2014, 12:05 PM  LOS: 2 days

## 2014-04-15 NOTE — Progress Notes (Signed)
Patient drew up insulin and self administered while RN observed for meals today.  Patient used correct technique.  Patient stated that she used to give her mother insulin.

## 2014-04-16 LAB — BASIC METABOLIC PANEL
ANION GAP: 9 (ref 5–15)
BUN: <5 mg/dL — ABNORMAL LOW (ref 6–23)
CO2: 21 mmol/L (ref 19–32)
Calcium: 9.9 mg/dL (ref 8.4–10.5)
Chloride: 106 mmol/L (ref 96–112)
Creatinine, Ser: 0.62 mg/dL (ref 0.50–1.10)
GFR calc non Af Amer: >90 mL/min (ref >90)
Glucose, Bld: 284 mg/dL — ABNORMAL HIGH (ref 70–99)
POTASSIUM: 4 mmol/L (ref 3.5–5.1)
Sodium: 136 mmol/L (ref 135–145)

## 2014-04-16 LAB — GLUCOSE, CAPILLARY
Glucose-Capillary: 309 mg/dL — ABNORMAL HIGH (ref 70–99)
Glucose-Capillary: 262 mg/dL — ABNORMAL HIGH (ref 70–99)
Glucose-Capillary: 293 mg/dL — ABNORMAL HIGH (ref 70–99)

## 2014-04-16 LAB — HEMOGLOBIN A1C
Hgb A1c MFr Bld: 10.4 % — ABNORMAL HIGH (ref 4.8–5.6)
MEAN PLASMA GLUCOSE: 252 mg/dL

## 2014-04-16 MED ORDER — INSULIN STARTER KIT- PEN NEEDLES (ENGLISH)
1.0000 | Freq: Once | Status: AC
  Administered 2014-04-16: 1
  Filled 2014-04-16: qty 1

## 2014-04-16 MED ORDER — LIVING WELL WITH DIABETES BOOK
Freq: Once | Status: AC
  Administered 2014-04-16: 12:00:00
  Filled 2014-04-16: qty 1

## 2014-04-16 MED ORDER — INSULIN ASPART 100 UNIT/ML FLEXPEN: PEN_INJECTOR | SUBCUTANEOUS | Status: DC

## 2014-04-16 MED ORDER — INSULIN GLARGINE 100 UNIT/ML SOLOSTAR PEN: 15.0000 [IU] | PEN_INJECTOR | Freq: Every day | SUBCUTANEOUS | Status: DC

## 2014-04-16 MED ORDER — INSULIN ASPART 100 UNIT/ML ~~LOC~~ SOLN
5.0000 [IU] | Freq: Three times a day (TID) | SUBCUTANEOUS | Status: DC
Start: 2014-04-16 — End: 2014-04-16

## 2014-04-16 MED ORDER — INSULIN GLARGINE 100 UNIT/ML ~~LOC~~ SOLN: 15.0000 [IU] | Freq: Every day | SUBCUTANEOUS | Status: DC

## 2014-04-16 MED ORDER — INSULIN PEN NEEDLE 32G X 4 MM MISC: 1.0000 | Status: DC | PRN

## 2014-04-16 MED ORDER — METFORMIN HCL 500 MG PO TABS
250.0000 mg | ORAL_TABLET | Freq: Two times a day (BID) | ORAL | Status: DC
  Filled 2014-04-16 (×2): qty 1

## 2014-04-16 MED ORDER — INSULIN STARTER KIT- PEN NEEDLES (ENGLISH): 1.0000 | Freq: Once | Status: DC

## 2014-04-16 MED ORDER — INSULIN ASPART 100 UNIT/ML ~~LOC~~ SOLN
5.0000 [IU] | Freq: Three times a day (TID) | SUBCUTANEOUS | Status: DC
  Administered 2014-04-16: 5 [IU] via SUBCUTANEOUS

## 2014-04-16 MED ORDER — BLOOD GLUCOSE MONITOR KIT: PACK | Status: DC

## 2014-04-16 MED ORDER — FERROUS SULFATE 325 (65 FE) MG PO TABS: 325.0000 mg | ORAL_TABLET | Freq: Three times a day (TID) | ORAL | Status: DC

## 2014-04-16 NOTE — Progress Notes (Signed)
Patient viewed DM education videos with family [phone].  Discharge instructions and medications discussed with patient.  Prescriptions given to patient.  All questions answered.

## 2014-04-16 NOTE — Plan of Care (Signed)
Problem: Food- and Nutrition-Related Knowledge Deficit (NB-1.1) Goal: Nutrition education Formal process to instruct or train a patient/client in a skill or to impart knowledge to help patients/clients voluntarily manage or modify food choices and eating behavior to maintain or improve health. Outcome: Completed/Met Date Met:  04/16/14  RD consulted for nutrition education regarding diabetes.     Lab Results  Component Value Date    HGBA1C 10.4* 04/13/2014    RD provided "Carbohydrate Counting for People with Diabetes" handout from the Academy of [employer] and Dietetics as well as the "Plate Method Eating" handout. Discussed different food groups and their effects on blood sugar, emphasizing carbohydrate-containing foods. Provided list of carbohydrates and recommended serving sizes of common foods.  Discussed importance of controlled and consistent carbohydrate intake throughout the day. Recommended 4-5 servings of carbohydrate at each meal. Emphasized adequate protein intake. Provided examples of ways to balance meals/snacks and encouraged intake of high-fiber, whole grain complex carbohydrates. Discussed diabetic friendly drink options.Teach back method used.  Expect good compliance.  Body mass index is 42.34 kg/(m^2). Pt meets criteria for morbid obesity based on current BMI.  Current diet order is carbohydrate modifed, patient is consuming approximately 100% of meals at this time. Labs and medications reviewed. No further nutrition interventions warranted at this time. RD contact information provided. If additional nutrition issues arise, please re-consult RD.  Stephanie La, MS, RD, LDN Pager # [phone] After hours/ weekend pager # [phone]         

## 2014-04-16 NOTE — Discharge Summary (Signed)
Physician Discharge Summary  Carrie Neal GYJ:[id number] DOB: 03/26/1970 DOA: 04/13/2014  PCP: Lennie Odor, PA-C  Admit date: 04/13/2014 Discharge date: 04/16/2014  Time spent: >35 minutes  Recommendations for Outpatient Follow-up:  F/u with PCP in 1 week  Discharge Diagnoses:  Active Problems:   Anemia   DKA (diabetic ketoacidoses)   Tachycardia   Hypercalcemia   Discharge Condition: stable   Diet recommendation: DM  Filed Weights   04/14/14 1555 04/14/14 2128 04/15/14 2205  Weight: 116.9 kg (257 lb 11.5 oz) 126.327 kg (278 lb 8 oz) 120.793 kg (266 lb 4.8 oz)    History of present illness:  44 y/o female with PMH of Anemia presented with polydipsia, polyuria, nausea, and som blurry vision and found to have DKA; -apparently had symptoms of blurred vision and dry mouth for the past week along with polyuria, and polydipsia. + nausea, No emesis. Pt had bs tested by her ob-gyn >700 and sent to ED for evaluation and noted to be in DKA   Hospital Course:  1. DKA, resolved with IVF+ IV insulin; transitioned to SQ insulin; replaced lytes, phosphorus  2. DM new Dx; Ha1c-10.4; glucose is better controlled with insulin regimen; d/w patient, she would like continue insulin, will discharge on lantus 15 QHS, with outpatient f/u titration as needed in 1 week; patient was evaluated with DM education, appreciate the consult; we did not start metformin yet due to recent acidosis; may need to consider PO metformin as outpatient in 1-2 week;  3. Dehydration, resolved with IVF 4. DOE, tachycardia of unclear etiology; CXR: unremarkable; ECG: NSR;  troponin's are negative, denies acute chest pains; echo showed diastolic dysfunction but without s/s of acute CHF, no need for diuretics at this time  5. Anemia, IDA; no s/s of acute bleeding; started PO iron    Echo: Study Conclusions  - Left ventricle: The cavity size was normal. Wall thickness was normal. Systolic function was vigorous. The  estimated ejection fraction was in the range of 65% to 70%. Wall motion was normal; there were no regional wall motion abnormalities. Doppler parameters are consistent with abnormal left ventricular relaxation (grade 1 diastolic dysfunction). - Right ventricle: The cavity size was mildly dilated. Wall thickness was normal.  Procedures:  echo (i.e. Studies not automatically included, echos, thoracentesis, etc; not x-rays)  Consultations:  DM education   Discharge Exam: Filed Vitals:   04/16/14 0951  BP: 135/80  Pulse: 92  Temp: 97.7 F (36.5 C)  Resp: 17    General: alert Cardiovascular: s1,s2 rrr Respiratory: CTA BL  Discharge Instructions  Discharge Instructions    Diet Carb Modified    Complete by:  As directed      Discharge instructions    Complete by:  As directed   Please follow up with primary care doctor in 1 week     Increase activity slowly    Complete by:  As directed             Medication List    STOP taking these medications        ibuprofen 600 MG tablet  Commonly known as:  ADVIL,MOTRIN      TAKE these medications        B-12 PO  Take 1 tablet by mouth daily.     ferrous sulfate 325 (65 FE) MG tablet  Take 1 tablet (325 mg total) by mouth 3 (three) times daily with meals.     insulin aspart 100 UNIT/ML FlexPen  Commonly known as:  NOVOLOG  Use insulin sliding scale : CBG 70 - 120: 0 units;  CBG 121 - 150: 1 unit;  CBG 151 - 200: 2 units;  CBG;  201 - 250: 3 units;  CBG 251 - 300: 5 units;  CBG 301 - 350: 7 units CBG 351 - 400: 9 units     Insulin Glargine 100 UNIT/ML Solostar Pen  Commonly known as:  LANTUS SOLOSTAR  Inject 15 Units into the skin daily at 10 pm.     multivitamin with minerals Tabs tablet  Take 1 tablet by mouth daily.     oxyCODONE-acetaminophen 5-325 MG per tablet  Commonly known as:  ROXICET  Take 1 tablet by mouth every 4 (four) hours as needed for moderate pain or severe pain.       Allergies   Allergen Reactions  . Codeine Itching       Follow-up Information    Follow up with REDMON,NOELLE, PA-C. Schedule an appointment as soon as possible for a visit in 1 week.   Specialty:  Nurse Practitioner   Contact information:   [address], [place] [address] [phone]        The results of significant diagnostics from this hospitalization (including imaging, microbiology, ancillary and laboratory) are listed below for reference.    Significant Diagnostic Studies: Dg Chest 2 View  04/14/2014   CLINICAL DATA:  Shortness of breath for 1 week.  EXAM: CHEST  2 VIEW  COMPARISON:  10/22/2012  FINDINGS: The heart size and mediastinal contours are within normal limits. Both lungs are clear. The visualized skeletal structures are unremarkable.  IMPRESSION: No active cardiopulmonary disease.   Electronically Signed   By: Rolm Baptise M.D.   On: 04/14/2014 15:32   US Transvaginal Non-ob  03/22/2014   CLINICAL DATA:  Daily vaginal bleeding x1 month, history of negative endometrial biopsy  EXAM: TRANSABDOMINAL AND TRANSVAGINAL ULTRASOUND OF PELVIS  TECHNIQUE: Both transabdominal and transvaginal ultrasound examinations of the pelvis were performed. Transabdominal technique was performed for global imaging of the pelvis including uterus, ovaries, adnexal regions, and pelvic cul-de-sac. It was necessary to proceed with endovaginal exam following the transabdominal exam to visualize the endometrium.  COMPARISON:  12/20/2003  FINDINGS: Uterus  Measurements: 12.0 x 6.4 x 8.1 cm. No fibroids or other mass visualized.  Endometrium  Thickness: 19 mm. Suspected focal endometrial thickening in the uterine body/fundus, measuring 2.0 x 1.5 x 2.4 cm but without associated vascularity.  Right ovary  Measurements: 3.7 x 2.7 x 2.4 cm. 2.2 x 2.3 x 2.1 cm simple cyst.  Left ovary  Measurements: 5.7 x 2.9 x 4.1 cm. 5.0 x 3.2 x 2.2 cm predominantly simple cyst.  Other findings  No free fluid.   IMPRESSION: Focal endometrial thickening, suspicious for a possible 2.4 cm endometrial polyp, although without associated vascularity. Consider hysteroscopy for further evaluation.  Bilateral ovarian cysts, measuring up to 5.0 cm in the left ovary. Follow-up pelvic ultrasound is suggested in 6-12 weeks.   Electronically Signed   By: Julian Hy M.D.   On: 03/22/2014 17:38   US Pelvis Complete  03/22/2014   CLINICAL DATA:  Daily vaginal bleeding x1 month, history of negative endometrial biopsy  EXAM: TRANSABDOMINAL AND TRANSVAGINAL ULTRASOUND OF PELVIS  TECHNIQUE: Both transabdominal and transvaginal ultrasound examinations of the pelvis were performed. Transabdominal technique was performed for global imaging of the pelvis including uterus, ovaries, adnexal regions, and pelvic cul-de-sac. It was necessary to proceed with endovaginal exam following  the transabdominal exam to visualize the endometrium.  COMPARISON:  12/20/2003  FINDINGS: Uterus  Measurements: 12.0 x 6.4 x 8.1 cm. No fibroids or other mass visualized.  Endometrium  Thickness: 19 mm. Suspected focal endometrial thickening in the uterine body/fundus, measuring 2.0 x 1.5 x 2.4 cm but without associated vascularity.  Right ovary  Measurements: 3.7 x 2.7 x 2.4 cm. 2.2 x 2.3 x 2.1 cm simple cyst.  Left ovary  Measurements: 5.7 x 2.9 x 4.1 cm. 5.0 x 3.2 x 2.2 cm predominantly simple cyst.  Other findings  No free fluid.  IMPRESSION: Focal endometrial thickening, suspicious for a possible 2.4 cm endometrial polyp, although without associated vascularity. Consider hysteroscopy for further evaluation.  Bilateral ovarian cysts, measuring up to 5.0 cm in the left ovary. Follow-up pelvic ultrasound is suggested in 6-12 weeks.   Electronically Signed   By: Julian Hy M.D.   On: 03/22/2014 17:38    Microbiology: No results found for this or any previous visit (from the past 240 hour(s)).   Labs: Basic Metabolic Panel:  Recent Labs Lab  04/13/14 1634 04/13/14 2156 04/14/14 0428 04/14/14 0821 04/15/14 0500 04/16/14 0438  NA 130*  --  133* 135 136 136  K 4.7  --  3.4* 3.7 4.1 4.0  CL 98  --  109 106 110 106  CO2 15*  --  19 21 17* 21  GLUCOSE 580*  --  160* 157* 322* 284*  BUN <5*  --  6 6 <5* <5*  CREATININE 1.13*  --  0.58 0.62 0.60 0.62  CALCIUM 11.6*  --  10.6* 10.4 9.8 9.9  MG  --  2.0  --   --   --   --   PHOS  --  1.4*  --   --   --   --    Liver Function Tests:  Recent Labs Lab 04/13/14 1634  AST 19  ALT 25  ALKPHOS 134*  BILITOT 1.0  PROT 9.2*  ALBUMIN 4.2   No results for input(s): LIPASE, AMYLASE in the last 168 hours. No results for input(s): AMMONIA in the last 168 hours. CBC:  Recent Labs Lab 04/13/14 1634  WBC 10.2  NEUTROABS 7.6  HGB 10.9*  HCT 34.6*  MCV 73.9*  PLT 454*   Cardiac Enzymes:  Recent Labs Lab 04/13/14 2156 04/14/14 0219 04/14/14 0821  TROPONINI <0.03 <0.03 <0.03   BNP: BNP (last 3 results) No results for input(s): BNP in the last 8760 hours.  ProBNP (last 3 results) No results for input(s): PROBNP in the last 8760 hours.  CBG:  Recent Labs Lab 04/15/14 1147 04/15/14 1654 04/15/14 2049 04/16/14 0741 04/16/14 1117  GLUCAP 277* 275* 353* 262* 293*       Signed:  Rowe Clack N  [facility] 04/16/2014, 12:41 PM

## 2014-04-16 NOTE — Progress Notes (Signed)
Inpatient Diabetes Program Recommendations  AACE/ADA: New Consensus Statement on Inpatient Glycemic Control (2013)  Target Ranges:  Prepandial:   less than 140 mg/dL      Peak postprandial:   less than 180 mg/dL (1-2 hours)      Critically ill patients:  140 - 180 mg/dL   Consult received. Will order all teaching for ed'l needs using system network video, insulin starter kit  (RN has already initiated teaching insulin adm, as pt use to give her mother insulin injections), exit care notes and Ed booklet.Will see patient and assess ed'l needs as well.  Please also consider the following as well. Inpatient Diabetes Program Recommendations Insulin - Basal: With weight of 111 kg, if intend to use lantus for home use, pt will most likely need 15-20 units lantus. Oral Agents: May want to consider starting some metformin 500 mg bid HgbA1C: May not be accurate given low hgb levels and hx of anemia. Noted on last admission, 04/01/2014, lab glucose was 141 mg/dL in mid afternoon. (not out of normal range for random glucose)  Pt would also benefit from OP education order-Put in OP ed order and our dept will enter a note re where pt lives and what ed program is closer to her (could attend the ed at the [facility].) Thank you Rosita Kea, RN, MSN, CDE  Diabetes Inpatient Program Office: [phone] Pager: [phone] 8:00 am to 5:00 pm

## 2014-04-16 NOTE — Progress Notes (Signed)
Demonstrated to patient how to use an insulin pen and explained the difference between the novolog and lantus and their uses for home therapy. Need OP education order please. Pt wants to go to  [facility] patient handout materials and ordered the insulin pen starter kit. Once arrives, pt is to be discharged.  Thank you Rosita Kea, RN, MSN, CDE  Diabetes Inpatient Program Office: [phone] Pager: [phone] 8:00 am to 5:00 pm

## 2014-04-17 LAB — UIFE/LIGHT CHAINS/TP QN, 24-HR UR
Albumin, U: DETECTED
Alpha 1, Urine: DETECTED — AB
Alpha 2, Urine: DETECTED — AB
Beta, Urine: DETECTED — AB
GAMMA UR: DETECTED — AB
Total Protein, Urine: 9 mg/dL (ref 5–24)

## 2014-04-17 LAB — PROTEIN ELECTROPHORESIS, SERUM
ALBUMIN ELP: 50.5 % — AB (ref 55.8–66.1)
ALPHA-2-GLOBULIN: 7.9 % (ref 7.1–11.8)
Alpha-1-Globulin: 3.5 % (ref 2.9–4.9)
BETA 2: 8 % — AB (ref 3.2–6.5)
Beta Globulin: 8.7 % — ABNORMAL HIGH (ref 4.7–7.2)
GAMMA GLOBULIN: 21.4 % — AB (ref 11.1–18.8)
M-Spike, %: NOT DETECTED g/dL
Total Protein ELP: 9.3 g/dL — ABNORMAL HIGH (ref 6.0–8.3)

## 2014-04-18 LAB — VITAMIN D 25 HYDROXY (VIT D DEFICIENCY, FRACTURES): VIT D 25 HYDROXY: 13.4 ng/mL — AB (ref 30.0–100.0)

## 2014-04-20 LAB — VITAMIN D 1,25 DIHYDROXY
Vitamin D 1, 25 (OH)2 Total: 112 pg/mL
Vitamin D2 1, 25 (OH)2: <10 pg/mL
Vitamin D3 1, 25 (OH)2: 112 pg/mL

## 2014-04-26 LAB — PTH-RELATED PEPTIDE: PTH-related peptide: 14 pg/mL (ref 14–27)

## 2014-04-27 ENCOUNTER — Ambulatory Visit (INDEPENDENT_AMBULATORY_CARE_PROVIDER_SITE_OTHER): Payer: BLUE CROSS/BLUE SHIELD | Admitting: Family

## 2014-04-27 ENCOUNTER — Encounter: Payer: Self-pay | Admitting: Family

## 2014-04-27 VITALS — BP 120/88 | HR 89 | Temp 98.2°F | Resp 18 | Ht 66.0 in | Wt 255.8 lb

## 2014-04-27 DIAGNOSIS — E11319 Type 2 diabetes mellitus with unspecified diabetic retinopathy without macular edema: Secondary | ICD-10-CM

## 2014-04-27 DIAGNOSIS — E1165 Type 2 diabetes mellitus with hyperglycemia: Secondary | ICD-10-CM

## 2014-04-27 DIAGNOSIS — Z23 Encounter for immunization: Secondary | ICD-10-CM

## 2014-04-27 DIAGNOSIS — IMO0002 Reserved for concepts with insufficient information to code with codable children: Secondary | ICD-10-CM

## 2014-04-27 DIAGNOSIS — E119 Type 2 diabetes mellitus without complications: Secondary | ICD-10-CM | POA: Insufficient documentation

## 2014-04-27 MED ORDER — GLUCOSE BLOOD VI STRP: ORAL_STRIP | Status: DC

## 2014-04-27 MED ORDER — INSULIN GLARGINE 100 UNIT/ML SOLOSTAR PEN: 20.0000 [IU] | PEN_INJECTOR | Freq: Every day | SUBCUTANEOUS | Status: DC

## 2014-04-27 NOTE — Progress Notes (Signed)
Subjective:    Patient ID: Carrie Neal, female    DOB: 1970/09/20, 44 y.o.   MRN: [id number]  Chief Complaint  Patient presents with  . Establish Care    would like A1c check, having problems with blurred vision    HPI:  Carrie Neal is a 44 y.o. female who presents today to establish care and discuss a recent hospitalization for newly diagnosed diabetes.  Patient was recently seen in the hospital for polydipsia, polyuria, nausea, and some blurred vision. She was found to have DKA with a blood sugar greater than 700 which was treated with IV insulin and IV fluids. She was transitioned to subcutaneous insulin. Her in-hospital A1c was 10.4. She also had a 2-D echo of her heart completed which showed an ejection fraction of 65-70% and normal wall motion. It was noted her right ventricle was mildly dilated with normal wall thickness. She was discharged from the hospital on 15 units of long-acting insulin and a sliding scale of NovoLog. All hospital records and lab work was reviewed in detail.  She continues to experience associated symptom of blurred vision and some neuropathy in her right leg. This has been going on for about a month.  Her blood sugars at home remain elevated in the area of mid 200s. Reports that she is using the insulin and not having any difficulties using them. The pain in her right leg is described as burning and intensity is 2/10. She has noted that since starting her insulin the pain has improved and her vision has improved slightly.    Allergies  Allergen Reactions  . Codeine Itching    Current Outpatient Prescriptions on File Prior to Visit  Medication Sig Dispense Refill  . blood glucose meter kit and supplies KIT Dispense based on patient and insurance preference. Use up to four times daily as directed. (FOR ICD-9 250.00, 250.01). 1 each 0  . Cyanocobalamin (B-12 PO) Take 1 tablet by mouth daily.     . ferrous sulfate 325 (65 FE) MG tablet Take 1 tablet (325 mg  total) by mouth 3 (three) times daily with meals. 60 tablet 0  . insulin aspart (NOVOLOG) 100 UNIT/ML FlexPen Use insulin sliding scale : CBG 70 - 120: 0 units;  CBG 121 - 150: 1 unit;  CBG 151 - 200: 2 units;  CBG;  201 - 250: 3 units;  CBG 251 - 300: 5 units;  CBG 301 - 350: 7 units CBG 351 - 400: 9 units 15 mL 2  . Insulin Glargine (LANTUS SOLOSTAR) 100 UNIT/ML Solostar Pen Inject 15 Units into the skin daily at 10 pm. 15 mL 2  . Insulin Pen Needle 32G X 4 MM MISC 1 packet by Does not apply route as needed. 100 each 2  . insulin starter kit- pen needles MISC 1 kit by Other route once. 1 kit 1  . Multiple Vitamin (MULTIVITAMIN WITH MINERALS) TABS tablet Take 1 tablet by mouth daily.     No current facility-administered medications on file prior to visit.    Past Medical History  Diagnosis Date  . Dysfunctional uterine bleeding 12/2013  . Anemia   . Diabetes mellitus without complication   . Asthma     Past Surgical History  Procedure Laterality Date  . Cholecystectomy    . Tubal ligation    . Umbilical hernia repair      as child  . Dilatation & curettage/hysteroscopy with myosure N/A 03/30/2014    Procedure: DILATATION & CURETTAGE/HYSTEROSCOPY  WITH MYOSURE and placement of Mirena Intrauterine device;  Surgeon: Alinda Dooms, MD;  Location: [facility];  Service: Gynecology;  Laterality: N/A;    Family History  Problem Relation Age of Onset  . Diabetes Mother   . Diabetes Cousin   . Diabetes Maternal Grandmother     History   Social History  . Marital Status: Married    Spouse Name: N/A  . Number of Children: N/A  . Years of Education: N/A   Occupational History  . Not on file.   Social History Main Topics  . Smoking status: Never Smoker   . Smokeless tobacco: Never Used  . Alcohol Use: No  . Drug Use: No  . Sexual Activity: Not on file   Other Topics Concern  . Not on file   Social History Narrative    Review of Systems  Constitutional: Negative for fever  and chills.  Eyes:       Positive for blurred vision.  Endocrine: Negative for polydipsia, polyphagia and polyuria.  Neurological: Negative for numbness.      Objective:    BP 120/88 mmHg  Pulse 89  Temp(Src) 98.2 F (36.8 C) (Oral)  Resp 18  Ht $R'5\' 6"'Mk$  (1.676 m)  Wt 255 lb 12.8 oz (116.03 kg)  BMI 41.31 kg/m2  SpO2 98%  LMP 01/31/2014 (Approximate) Nursing note and vital signs reviewed.  Physical Exam  Constitutional: She is oriented to person, place, and time. She appears well-developed and well-nourished. No distress.  Cardiovascular: Normal rate, regular rhythm, normal heart sounds and intact distal pulses.   Pulmonary/Chest: Effort normal and breath sounds normal.  Neurological: She is alert and oriented to person, place, and time.  Diabetic foot exam: Bilateral feet are free from skin breakdown or abrasions. Nails are trimmed appropriately. Pulses are intact and appropriate. Sensation is intact and appropriate to monofilament bilaterally.  Skin: Skin is warm and dry.  Psychiatric: She has a normal mood and affect. Her behavior is normal. Judgment and thought content normal.       Assessment & Plan:

## 2014-04-27 NOTE — Assessment & Plan Note (Signed)
Diabetes remains uncontrolled with an A1c of 10.4. Patient is currently using Lantus and NovoLog. Increase Lantus to 20 units nightly. Continue sliding scale NovoLog as previously prescribed current dosage. Patient will contact eye doctor for diabetic eye exam. Diabetic foot exam completed today and normal. Recheck A1c in 3 months. Obtain basic metabolic panel and urine microalbumin at next office visit in 1 month. Glucose test strips ordered.

## 2014-04-27 NOTE — Progress Notes (Signed)
Pre visit review using our clinic review tool, if applicable. No additional management support is needed unless otherwise documented below in the visit note. 

## 2014-04-27 NOTE — Patient Instructions (Signed)
Thank you for choosing [facility].  Summary/Instructions:  Your prescription(s) have been submitted to your pharmacy or been printed and provided for you. Please take as directed and contact our office if you believe you are having problem(s) with the medication(s) or have any questions.  If your symptoms worsen or fail to improve, please contact our office for further instruction, or in case of emergency go directly to the emergency room at the closest medical facility.    Type 2 Diabetes Mellitus Type 2 diabetes mellitus, often simply referred to as type 2 diabetes, is a long-lasting (chronic) disease. In type 2 diabetes, the pancreas does not make enough insulin (a hormone), the cells are less responsive to the insulin that is made (insulin resistance), or both. Normally, insulin moves sugars from food into the tissue cells. The tissue cells use the sugars for energy. The lack of insulin or the lack of normal response to insulin causes excess sugars to build up in the blood instead of going into the tissue cells. As a result, high blood sugar (hyperglycemia) develops. The effect of high sugar (glucose) levels can cause many complications. Type 2 diabetes was also previously called adult-onset diabetes, but it can occur at any age.  RISK FACTORS  A person is predisposed to developing type 2 diabetes if someone in the family has the disease and also has one or more of the following primary risk factors:  Overweight.  An inactive lifestyle.  A history of consistently eating high-calorie foods. Maintaining a normal weight and regular physical activity can reduce the chance of developing type 2 diabetes. SYMPTOMS  A person with type 2 diabetes may not show symptoms initially. The symptoms of type 2 diabetes appear slowly. The symptoms include:  Increased thirst (polydipsia).  Increased urination (polyuria).  Increased urination during the night (nocturia).  Weight loss. This  weight loss may be rapid.  Frequent, recurring infections.  Tiredness (fatigue).  Weakness.  Vision changes, such as blurred vision.  Fruity smell to your breath.  Abdominal pain.  Nausea or vomiting.  Cuts or bruises which are slow to heal.  Tingling or numbness in the hands or feet. DIAGNOSIS Type 2 diabetes is frequently not diagnosed until complications of diabetes are present. Type 2 diabetes is diagnosed when symptoms or complications are present and when blood glucose levels are increased. Your blood glucose level may be checked by one or more of the following blood tests:  A fasting blood glucose test. You will not be allowed to eat for at least 8 hours before a blood sample is taken.  A random blood glucose test. Your blood glucose is checked at any time of the day regardless of when you ate.  A hemoglobin A1c blood glucose test. A hemoglobin A1c test provides information about blood glucose control over the previous 3 months.  An oral glucose tolerance test (OGTT). Your blood glucose is measured after you have not eaten (fasted) for 2 hours and then after you drink a glucose-containing beverage. TREATMENT   You may need to take insulin or diabetes medicine daily to keep blood glucose levels in the desired range.  If you use insulin, you may need to adjust the dosage depending on the carbohydrates that you eat with each meal or snack. The treatment goal is to maintain the before meal blood sugar (preprandial glucose) level at 70-130 mg/dL. HOME CARE INSTRUCTIONS   Have your hemoglobin A1c level checked twice a year.  Perform daily blood glucose monitoring as  directed by your health care provider.  Monitor urine ketones when you are ill and as directed by your health care provider.  Take your diabetes medicine or insulin as directed by your health care provider to maintain your blood glucose levels in the desired range.  Never run out of diabetes medicine or  insulin. It is needed every day.  If you are using insulin, you may need to adjust the amount of insulin given based on your intake of carbohydrates. Carbohydrates can raise blood glucose levels but need to be included in your diet. Carbohydrates provide vitamins, minerals, and fiber which are an essential part of a healthy diet. Carbohydrates are found in fruits, vegetables, whole grains, dairy products, legumes, and foods containing added sugars.  Eat healthy foods. You should make an appointment to see a registered dietitian to help you create an eating plan that is right for you.  Lose weight if you are overweight.  Carry a medical alert card or wear your medical alert jewelry.  Carry a 15-gram carbohydrate snack with you at all times to treat low blood glucose (hypoglycemia). Some examples of 15-gram carbohydrate snacks include:  Glucose tablets, 3 or 4.  Glucose gel, 15-gram tube.  Raisins, 2 tablespoons (24 grams).  Jelly beans, 6.  Animal crackers, 8.  Regular pop, 4 ounces (120 mL).  Gummy treats, 9.  Recognize hypoglycemia. Hypoglycemia occurs with blood glucose levels of 70 mg/dL and below. The risk for hypoglycemia increases when fasting or skipping meals, during or after intense exercise, and during sleep. Hypoglycemia symptoms can include:  Tremors or shakes.  Decreased ability to concentrate.  Sweating.  Increased heart rate.  Headache.  Dry mouth.  Hunger.  Irritability.  Anxiety.  Restless sleep.  Altered speech or coordination.  Confusion.  Treat hypoglycemia promptly. If you are alert and able to safely swallow, follow the 15:15 rule:  Take 15-20 grams of rapid-acting glucose or carbohydrate. Rapid-acting options include glucose gel, glucose tablets, or 4 ounces (120 mL) of fruit juice, regular soda, or low-fat milk.  Check your blood glucose level 15 minutes after taking the glucose.  Take 15-20 grams more of glucose if the repeat blood  glucose level is still 70 mg/dL or below.  Eat a meal or snack within 1 hour once blood glucose levels return to normal.  Be alert to feeling very thirsty and urinating more frequently than usual, which are early signs of hyperglycemia. An early awareness of hyperglycemia allows for prompt treatment. Treat hyperglycemia as directed by your health care provider.  Engage in at least 150 minutes of moderate-intensity physical activity a week, spread over at least 3 days of the week or as directed by your health care provider. In addition, you should engage in resistance exercise at least 2 times a week or as directed by your health care provider. Try to spend no more than 90 minutes at one time inactive.  Adjust your medicine and food intake as needed if you start a new exercise or sport.  Follow your sick-day plan anytime you are unable to eat or drink as usual.  Do not use any tobacco products including cigarettes, chewing tobacco, or electronic cigarettes. If you need help quitting, ask your health care provider.  Limit alcohol intake to no more than 1 drink per day for nonpregnant women and 2 drinks per day for men. You should drink alcohol only when you are also eating food. Talk with your health care provider whether alcohol is safe for  you. Tell your health care provider if you drink alcohol several times a week.  Keep all follow-up visits as directed by your health care provider. This is important.  Schedule an eye exam soon after the diagnosis of type 2 diabetes and then annually.  Perform daily skin and foot care. Examine your skin and feet daily for cuts, bruises, redness, nail problems, bleeding, blisters, or sores. A foot exam by a health care provider should be done annually.  Brush your teeth and gums at least twice a day and floss at least once a day. Follow up with your dentist regularly.  Share your diabetes management plan with your workplace or school.  Stay up-to-date  with immunizations. It is recommended that people with diabetes who are over 28 years old get the pneumonia vaccine. In some cases, two separate shots may be given. Ask your health care provider if your pneumonia vaccination is up-to-date.  Learn to manage stress.  Obtain ongoing diabetes education and support as needed.  Participate in or seek rehabilitation as needed to maintain or improve independence and quality of life. Request a physical or occupational therapy referral if you are having foot or hand numbness, or difficulties with grooming, dressing, eating, or physical activity. SEEK MEDICAL CARE IF:   You are unable to eat food or drink fluids for more than 6 hours.  You have nausea and vomiting for more than 6 hours.  Your blood glucose level is over 240 mg/dL.  There is a change in mental status.  You develop an additional serious illness.  You have diarrhea for more than 6 hours.  You have been sick or have had a fever for a couple of days and are not getting better.  You have pain during any physical activity.  SEEK IMMEDIATE MEDICAL CARE IF:  You have difficulty breathing.  You have moderate to large ketone levels. MAKE SURE YOU:  Understand these instructions.  Will watch your condition.  Will get help right away if you are not doing well or get worse. Document Released: 02/02/2005 Document Revised: 06/19/2013 Document Reviewed: 09/01/2011 [facility] Patient Information 2015 [place], [place]. This information is not intended to replace advice given to you by your health care provider. Make sure you discuss any questions you have with your health care provider.

## 2014-05-01 ENCOUNTER — Telehealth: Payer: Self-pay | Admitting: Family

## 2014-05-01 NOTE — Telephone Encounter (Signed)
Was there any number given for Cora?

## 2014-05-01 NOTE — Telephone Encounter (Signed)
Carrie Neal is requesting that we authorize one more visit for diabetes management. Please call cora.

## 2014-05-02 NOTE — Telephone Encounter (Signed)
Yes.  Sorry I placed it in the contacts tab at the bottom [facility] ph # [phone]

## 2014-05-02 NOTE — Telephone Encounter (Signed)
Called Cora back to confirm

## 2014-05-09 ENCOUNTER — Telehealth: Payer: Self-pay | Admitting: Family

## 2014-05-09 NOTE — Telephone Encounter (Signed)
Carrie Neal needs to speak with you re:   Patient is being discharged, and BP is 142/104 standing. Currently not taking any BP meds. Hr sitting is 94.

## 2014-05-09 NOTE — Telephone Encounter (Signed)
Is there a number to call back and where is she being discharged from?

## 2014-05-10 NOTE — Telephone Encounter (Signed)
Called pt per Mel Almond to make sure she was ok with her BP being that high. She stated that she does not have any BP issues and she told Mel Almond that she was just upset. It usually is not that high. I just told her to give [place] a call if she has any other issues or questions. She will be seeing [place] on 05/21/14.

## 2014-05-21 ENCOUNTER — Other Ambulatory Visit: Payer: BLUE CROSS/BLUE SHIELD

## 2014-05-21 ENCOUNTER — Ambulatory Visit (INDEPENDENT_AMBULATORY_CARE_PROVIDER_SITE_OTHER): Payer: BLUE CROSS/BLUE SHIELD | Admitting: Family

## 2014-05-21 ENCOUNTER — Telehealth: Payer: Self-pay | Admitting: Family

## 2014-05-21 ENCOUNTER — Encounter: Payer: Self-pay | Admitting: Family

## 2014-05-21 VITALS — BP 110/74 | HR 82 | Temp 98.0°F | Resp 18 | Wt 254.0 lb

## 2014-05-21 DIAGNOSIS — IMO0002 Reserved for concepts with insufficient information to code with codable children: Secondary | ICD-10-CM

## 2014-05-21 DIAGNOSIS — E1165 Type 2 diabetes mellitus with hyperglycemia: Secondary | ICD-10-CM

## 2014-05-21 DIAGNOSIS — E11319 Type 2 diabetes mellitus with unspecified diabetic retinopathy without macular edema: Secondary | ICD-10-CM

## 2014-05-21 DIAGNOSIS — J302 Other seasonal allergic rhinitis: Secondary | ICD-10-CM

## 2014-05-21 LAB — MICROALBUMIN / CREATININE URINE RATIO
CREATININE, U: 249 mg/dL
MICROALB UR: 2.9 mg/dL — AB (ref 0.0–1.9)
Microalb Creat Ratio: 1.2 mg/g (ref 0.0–30.0)

## 2014-05-21 NOTE — Assessment & Plan Note (Signed)
Symptoms and exam consistent with allergic rhinitis secondary to seasonal allergies. Start second generation antihistamine and nasal corticosteroid as needed. Follow-up if symptoms worsen or fail to improve.

## 2014-05-21 NOTE — Progress Notes (Signed)
Pre visit review using our clinic review tool, if applicable. No additional management support is needed unless otherwise documented below in the visit note. 

## 2014-05-21 NOTE — Telephone Encounter (Signed)
Pt aware of results 

## 2014-05-21 NOTE — Assessment & Plan Note (Signed)
Diabetes appears to be stabilizing with increased Lantus. Eye exam was completed. Obtain urine microalbumin. Continue Lantus at 20 Units daily. Patient wishes to decrease insulin use and start oral medication if possible. Discussed having a stable A1c and then we can gradually make transition. Follow up in 2 months for A1c.

## 2014-05-21 NOTE — Progress Notes (Signed)
Subjective:    Patient ID: Carrie Neal, female    DOB: 1971/01/09, 44 y.o.   MRN: [id number]  Chief Complaint  Patient presents with  . Establish Care    She has been having headaches due to pollen, wants to see whats best to take, also says her blood sugars have been reading good, even without using the insulin needle wants to see if she could just use pill instead since her sugars have been regulated, all she is taking now is the insulin at night     HPI:  Carrie Neal is a 44 y.o. female who presents today    1) Diabetes - Recently seen in the office with her blood sugars in the 200's. Lantus was increased from 15 to 20 and patient indicates that her blood sugars have been staying in the 90s for almost all the readings. She has not had to use Novolog at all. She has since completed her eye exam with no complications noted.   2) Allergies - Associated symptoms headache, watery eyes, and sneezing has been going on for about a week. Denies modifying factors of OTC medications because she wasn't sure what to take.   Allergies  Allergen Reactions  . Codeine Itching    Current Outpatient Prescriptions on File Prior to Visit  Medication Sig Dispense Refill  . blood glucose meter kit and supplies KIT Dispense based on patient and insurance preference. Use up to four times daily as directed. (FOR ICD-9 250.00, 250.01). 1 each 0  . Cyanocobalamin (B-12 PO) Take 1 tablet by mouth daily.     . ferrous sulfate 325 (65 FE) MG tablet Take 1 tablet (325 mg total) by mouth 3 (three) times daily with meals. 60 tablet 0  . glucose blood (ONE TOUCH ULTRA TEST) test strip Use as instructed 100 each 12  . insulin aspart (NOVOLOG) 100 UNIT/ML FlexPen Use insulin sliding scale : CBG 70 - 120: 0 units;  CBG 121 - 150: 1 unit;  CBG 151 - 200: 2 units;  CBG;  201 - 250: 3 units;  CBG 251 - 300: 5 units;  CBG 301 - 350: 7 units CBG 351 - 400: 9 units 15 mL 2  . Insulin Glargine (LANTUS SOLOSTAR) 100 UNIT/ML  Solostar Pen Inject 20 Units into the skin daily at 10 pm. 15 mL 2  . Insulin Pen Needle 32G X 4 MM MISC 1 packet by Does not apply route as needed. 100 each 2  . insulin starter kit- pen needles MISC 1 kit by Other route once. 1 kit 1  . Multiple Vitamin (MULTIVITAMIN WITH MINERALS) TABS tablet Take 1 tablet by mouth daily.     No current facility-administered medications on file prior to visit.    Review of Systems  Constitutional: Negative for fever and chills.  HENT: Positive for rhinorrhea and sneezing.   Eyes: Positive for discharge (watery).  Neurological: Positive for headaches.      Objective:    BP 110/74 mmHg  Pulse 82  Temp(Src) 98 F (36.7 C) (Oral)  Resp 18  Wt 254 lb (115.214 kg)  SpO2 97% Nursing note and vital signs reviewed.  Physical Exam  Constitutional: She is oriented to person, place, and time. She appears well-developed and well-nourished. No distress.  HENT:  Right Ear: Hearing, tympanic membrane, external ear and ear canal normal.  Left Ear: Hearing, tympanic membrane, external ear and ear canal normal.  Nose: No epistaxis. Right sinus exhibits no maxillary sinus tenderness  and no frontal sinus tenderness. Left sinus exhibits no maxillary sinus tenderness and no frontal sinus tenderness.  Mouth/Throat: Uvula is midline, oropharynx is clear and moist and mucous membranes are normal.  Eyes: Conjunctivae are normal. Pupils are equal, round, and reactive to light.  Cardiovascular: Normal rate, regular rhythm, normal heart sounds and intact distal pulses.   Pulmonary/Chest: Effort normal and breath sounds normal.  Neurological: She is alert and oriented to person, place, and time.  Skin: Skin is warm and dry.  Psychiatric: She has a normal mood and affect. Her behavior is normal. Judgment and thought content normal.       Assessment & Plan:

## 2014-05-21 NOTE — Patient Instructions (Addendum)
Thank you for choosing [facility].  Summary/Instructions:  Please stop taking your Novolog at this time.   Continue to take your Lantus.  For your allergies - Try any of the over the counter antihistamines like claritin, allegra or zyrtec combined with a nasal corticosteroid like flonase or nasocort.   Please stop by the lab on the basement level of the building for your blood work. Your results will be released to [place] (or called to you) after review, usually within 72 hours after test completion. If any changes need to be made, you will be notified at that same time.  If your symptoms worsen or fail to improve, please contact our office for further instruction, or in case of emergency go directly to the emergency room at the closest medical facility.

## 2014-05-21 NOTE — Telephone Encounter (Signed)
Please inform the patient that her urine microalbumin did show protein in her urine indicating there may be some potential kidney damage. There is nothing that needs to be done, only monitored at this time. Since her other kidney function levels look good, this is re-assuring. Continue with follow up plan at the end of May or beginning of June.

## 2014-06-01 ENCOUNTER — Telehealth: Payer: Self-pay | Admitting: Family

## 2014-06-01 MED ORDER — AZITHROMYCIN 250 MG PO TABS: ORAL_TABLET | ORAL | Status: DC

## 2014-06-01 NOTE — Telephone Encounter (Signed)
Pt called in said that she is not any better.  It is down in her chest now and she wants to know if Marya Amsler can call her in some meds to help.  She was just here on the 4th.   Best number [phone]

## 2014-06-01 NOTE — Telephone Encounter (Signed)
Medication sent to the pharmacy.

## 2014-07-23 ENCOUNTER — Ambulatory Visit (INDEPENDENT_AMBULATORY_CARE_PROVIDER_SITE_OTHER): Payer: BLUE CROSS/BLUE SHIELD | Admitting: Family

## 2014-07-23 ENCOUNTER — Encounter: Payer: Self-pay | Admitting: Family

## 2014-07-23 ENCOUNTER — Other Ambulatory Visit (INDEPENDENT_AMBULATORY_CARE_PROVIDER_SITE_OTHER): Payer: BLUE CROSS/BLUE SHIELD

## 2014-07-23 ENCOUNTER — Telehealth: Payer: Self-pay | Admitting: Family

## 2014-07-23 VITALS — BP 106/68 | HR 92 | Temp 97.7°F | Resp 18 | Ht 66.0 in | Wt 258.1 lb

## 2014-07-23 DIAGNOSIS — E11319 Type 2 diabetes mellitus with unspecified diabetic retinopathy without macular edema: Secondary | ICD-10-CM

## 2014-07-23 DIAGNOSIS — E1165 Type 2 diabetes mellitus with hyperglycemia: Secondary | ICD-10-CM

## 2014-07-23 DIAGNOSIS — IMO0002 Reserved for concepts with insufficient information to code with codable children: Secondary | ICD-10-CM

## 2014-07-23 LAB — HEMOGLOBIN A1C: HEMOGLOBIN A1C: 6.2 % (ref 4.6–6.5)

## 2014-07-23 MED ORDER — INSULIN GLARGINE 100 UNIT/ML SOLOSTAR PEN: 20.0000 [IU] | PEN_INJECTOR | Freq: Every day | SUBCUTANEOUS | Status: DC

## 2014-07-23 MED ORDER — SAXAGLIPTIN HCL 5 MG PO TABS: 5.0000 mg | ORAL_TABLET | Freq: Every day | ORAL | Status: DC

## 2014-07-23 NOTE — Progress Notes (Signed)
Pre visit review using our clinic review tool, if applicable. No additional management support is needed unless otherwise documented below in the visit note. 

## 2014-07-23 NOTE — Progress Notes (Signed)
   Subjective:    Patient ID: Carrie Neal, female    DOB: Mar 10, 1970, 44 y.o.   MRN: [id number]  Chief Complaint  Patient presents with  . Follow-up    diabetes, says she doesn't think that she needs insulin her sugars have been around 61 or 80    HPI:  Carrie Neal is a 44 y.o. female with a PMH of anemia, Type 2 diabetes, and hypercalcemia who presents today for a follow up office visit.     1.) Type 2 diabetes - Reports her sugars have been stable with current regimen and her preprandial glucose is running in the 70-100s. Currently maintained on 20 Units of Lantus nightly. Takes the medication as prescribed, denies adverse side effects or hypoglycemia.   Lab Results  Component Value Date   HGBA1C 10.4* 04/13/2014   Allergies  Allergen Reactions  . Codeine Itching    Current Outpatient Prescriptions on File Prior to Visit  Medication Sig Dispense Refill  . blood glucose meter kit and supplies KIT Dispense based on patient and insurance preference. Use up to four times daily as directed. (FOR ICD-9 250.00, 250.01). 1 each 0  . Cyanocobalamin (B-12 PO) Take 1 tablet by mouth daily.     [name] Kitchen glucose blood (ONE TOUCH ULTRA TEST) test strip Use as instructed 100 each 12  . Insulin Pen Needle 32G X 4 MM MISC 1 packet by Does not apply route as needed. 100 each 2  . insulin starter kit- pen needles MISC 1 kit by Other route once. 1 kit 1  . Multiple Vitamin (MULTIVITAMIN WITH MINERALS) TABS tablet Take 1 tablet by mouth daily.     No current facility-administered medications on file prior to visit.     Review of Systems  Endocrine: Negative for polydipsia, polyphagia and polyuria.      Objective:    BP 106/68 mmHg  Pulse 92  Temp(Src) 97.7 F (36.5 C) (Oral)  Resp 18  Ht $R'5\' 6"'dB$  (1.676 m)  Wt 258 lb 1.9 oz (117.082 kg)  BMI 41.68 kg/m2  SpO2 99% Nursing note and vital signs reviewed.  Physical Exam  Constitutional: She is oriented to person, place, and time. She appears  well-developed and well-nourished. No distress.  Cardiovascular: Normal rate, regular rhythm, normal heart sounds and intact distal pulses.   Pulmonary/Chest: Effort normal and breath sounds normal.  Neurological: She is alert and oriented to person, place, and time.  Skin: Skin is warm and dry.  Psychiatric: She has a normal mood and affect. Her behavior is normal. Judgment and thought content normal.       Assessment & Plan:   Problem List Items Addressed This Visit      Endocrine   Type 2 diabetes, uncontrolled, with retinopathy - Primary    Diabetes appears to be well controlled with current dosage of Lantus. Indicates preprandial glucose levels are below goal. Obtain A1c. Discussed coming off of insulin and switching to Onglyza pending her A1c results.      Relevant Medications   Insulin Glargine (LANTUS SOLOSTAR) 100 UNIT/ML Solostar Pen   saxagliptin HCl (ONGLYZA) 5 MG TABS tablet   Other Relevant Orders   Hemoglobin A1c

## 2014-07-23 NOTE — Telephone Encounter (Signed)
Please inform the patient that her A1c is 6.2 indicating excellent control of her diabetes. Therefore she may discontinue the Lantus and start the Anita. We will plan to follow up in 3 months to recheck her A1c.

## 2014-07-23 NOTE — Patient Instructions (Signed)
Thank you for choosing [facility].  Summary/Instructions:  Your prescription(s) have been submitted to your pharmacy or been printed and provided for you. Please take as directed and contact our office if you believe you are having problem(s) with the medication(s) or have any questions.  Please stop by the lab on the basement level of the building for your blood work. Your results will be released to MyChart (or called to you) after review, usually within 72 hours after test completion. If any changes need to be made, you will be notified at that same time.   

## 2014-07-23 NOTE — Telephone Encounter (Signed)
Pt aware of results 

## 2014-07-23 NOTE — Assessment & Plan Note (Signed)
Diabetes appears to be well controlled with current dosage of Lantus. Indicates preprandial glucose levels are below goal. Obtain A1c. Discussed coming off of insulin and switching to Onglyza pending her A1c results.

## 2014-07-25 ENCOUNTER — Telehealth: Payer: Self-pay | Admitting: Family

## 2014-07-25 MED ORDER — LINAGLIPTIN 5 MG PO TABS: 5.0000 mg | ORAL_TABLET | Freq: Every day | ORAL | Status: DC

## 2014-07-25 NOTE — Telephone Encounter (Signed)
Informed patient

## 2014-07-25 NOTE — Telephone Encounter (Signed)
Patient was prescribed saxagliptin HCl (ONGLYZA) 5 MG TABS tablet [[id number]] on Monday and medication is too expensive for her and there is no generic for it. Is there anything else that can be prescribed. She said there was another medicine she was supposed to stop but she went a head and took insulin since she doesn't have the pill yet.

## 2014-07-25 NOTE — Telephone Encounter (Signed)
Please inform the patient I have sent Tradjenta to her pharmacy, but I cannot guarentee that this will not also be expensive. There are discount cards available on their website that she can try. If it is too expensive, please have her contact her insurance company to find out what diabetes medications are covered.

## 2014-07-30 MED ORDER — METFORMIN HCL 500 MG PO TABS: 500.0000 mg | ORAL_TABLET | Freq: Two times a day (BID) | ORAL | Status: DC

## 2014-07-30 NOTE — Telephone Encounter (Signed)
Informed patient again about note below

## 2014-07-30 NOTE — Telephone Encounter (Signed)
Pt called in and wanted to know the status on the PA for the Tradjenta ?  Have you seen a PA for this med.  She is going to call ins compy and see what meds they will cover without PA

## 2014-07-30 NOTE — Telephone Encounter (Signed)
Patient talked with insurance company and they will approve Metformin

## 2014-07-30 NOTE — Telephone Encounter (Signed)
LVM letting pt know that rx has been sent in.

## 2014-08-09 ENCOUNTER — Telehealth: Payer: Self-pay

## 2014-08-09 NOTE — Telephone Encounter (Signed)
PA for tradjenta 5 mg has been done.

## 2014-08-10 ENCOUNTER — Telehealth: Payer: Self-pay

## 2014-08-10 NOTE — Telephone Encounter (Signed)
ERROR

## 2014-08-28 ENCOUNTER — Other Ambulatory Visit: Payer: Self-pay | Admitting: Family

## 2014-08-28 ENCOUNTER — Telehealth: Payer: Self-pay | Admitting: Family

## 2014-08-28 MED ORDER — METFORMIN HCL 500 MG PO TABS: 500.0000 mg | ORAL_TABLET | Freq: Two times a day (BID) | ORAL | Status: DC

## 2014-08-28 NOTE — Telephone Encounter (Signed)
Rx sent pt aware 

## 2014-08-28 NOTE — Telephone Encounter (Signed)
Patient has called in again regarding her metformin

## 2014-08-28 NOTE — Telephone Encounter (Signed)
Patient states she needs metformin called in to [facility] on [address].  Patient states she only has enough for today.

## 2014-09-21 ENCOUNTER — Other Ambulatory Visit: Payer: Self-pay | Admitting: Family

## 2014-09-28 DIAGNOSIS — E1165 Type 2 diabetes mellitus with hyperglycemia: Secondary | ICD-10-CM | POA: Diagnosis not present

## 2014-10-21 ENCOUNTER — Other Ambulatory Visit: Payer: Self-pay | Admitting: Family

## 2014-10-26 ENCOUNTER — Ambulatory Visit (INDEPENDENT_AMBULATORY_CARE_PROVIDER_SITE_OTHER): Payer: BLUE CROSS/BLUE SHIELD | Admitting: Family

## 2014-10-26 ENCOUNTER — Encounter: Payer: Self-pay | Admitting: Family

## 2014-10-26 ENCOUNTER — Other Ambulatory Visit (INDEPENDENT_AMBULATORY_CARE_PROVIDER_SITE_OTHER): Payer: BLUE CROSS/BLUE SHIELD

## 2014-10-26 DIAGNOSIS — E11319 Type 2 diabetes mellitus with unspecified diabetic retinopathy without macular edema: Secondary | ICD-10-CM

## 2014-10-26 DIAGNOSIS — IMO0002 Reserved for concepts with insufficient information to code with codable children: Secondary | ICD-10-CM

## 2014-10-26 DIAGNOSIS — E1165 Type 2 diabetes mellitus with hyperglycemia: Secondary | ICD-10-CM | POA: Diagnosis not present

## 2014-10-26 LAB — HEMOGLOBIN A1C: Hgb A1c MFr Bld: 5.8 % (ref 4.6–6.5)

## 2014-10-26 MED ORDER — GLIPIZIDE 5 MG PO TABS
5.0000 mg | ORAL_TABLET | Freq: Two times a day (BID) | ORAL | Status: DC
Start: 2014-10-26 — End: 2014-12-28

## 2014-10-26 NOTE — Assessment & Plan Note (Signed)
Type 2 diabetes initially started on insulin and was recently discontinued to start metformin. Since starting metformin she has experienced gastrointestinal distress and stomach pains. She discontinued taking the metformin approximately 2 days ago. Discontinue metformin. Start glipizide. Monitor blood sugar at home closely for the first 2 weeks. Obtain A1c. Follow-up blood sugar numbers in approximately 2 weeks or sooner if needed.

## 2014-10-26 NOTE — Patient Instructions (Signed)
Thank you for choosing [facility].  Summary/Instructions:  Please STOP taking the metformin and START taking glipizide.   Your prescription(s) have been submitted to your pharmacy or been printed and provided for you. Please take as directed and contact our office if you believe you are having problem(s) with the medication(s) or have any questions.  If your symptoms worsen or fail to improve, please contact our office for further instruction, or in case of emergency go directly to the emergency room at the closest medical facility.

## 2014-10-26 NOTE — Progress Notes (Signed)
Pre visit review using our clinic review tool, if applicable. No additional management support is needed unless otherwise documented below in the visit note. 

## 2014-10-26 NOTE — Progress Notes (Signed)
   Subjective:    Patient ID: Carrie Neal, female    DOB: 1970/12/17, 44 y.o.   MRN: [id number]  Chief Complaint  Patient presents with  . Medication Reaction    states that the metformin has caused her to have very bad abdominal pain, she stopped taking the metformin and has felt relief    HPI:  Carrie Neal is a 44 y.o. female with a PMH of Type 2 diabetes, anemia, and hypercalcemia who presents today for a follow up office visit.   1.) Type 2 diabetes - Recently started on metformin and currently maintained on metformin which she indicates has caused severe gastrointestinal distress and abdominal pain. Notes that when she discontinued taking the medication approximately 2 days ago the abdominal pain decreased as well as the gastrointestinal distress.  Lab Results  Component Value Date   HGBA1C 5.8 10/26/2014    Allergies  Allergen Reactions  . Codeine Itching    Current Outpatient Prescriptions on File Prior to Visit  Medication Sig Dispense Refill  . blood glucose meter kit and supplies KIT Dispense based on patient and insurance preference. Use up to four times daily as directed. (FOR ICD-9 250.00, 250.01). 1 each 0  . Cyanocobalamin (B-12 PO) Take 1 tablet by mouth daily.     [name] Kitchen glucose blood (ONE TOUCH ULTRA TEST) test strip Use as instructed 100 each 12  . Insulin Pen Needle 32G X 4 MM MISC 1 packet by Does not apply route as needed. 100 each 2  . Multiple Vitamin (MULTIVITAMIN WITH MINERALS) TABS tablet Take 1 tablet by mouth daily.     No current facility-administered medications on file prior to visit.    Review of Systems  Eyes:       Denies changes in vision  Endocrine: Negative for polydipsia, polyphagia and polyuria.  Neurological: Negative for numbness.      Objective:    BP 118/80 mmHg  Pulse 89  Temp(Src) 98.5 F (36.9 C) (Oral)  Resp 18  Ht $R'5\' 6"'rg$  (1.676 m)  Wt 253 lb (114.76 kg)  BMI 40.85 kg/m2  SpO2 99% Nursing note and vital signs  reviewed.  Physical Exam  Constitutional: She is oriented to person, place, and time. She appears well-developed and well-nourished. No distress.  Cardiovascular: Normal rate, regular rhythm, normal heart sounds and intact distal pulses.   Pulmonary/Chest: Effort normal and breath sounds normal.  Neurological: She is alert and oriented to person, place, and time.  Skin: Skin is warm and dry.  Psychiatric: She has a normal mood and affect. Her behavior is normal. Judgment and thought content normal.       Assessment & Plan:   Problem List Items Addressed This Visit      Endocrine   Type 2 diabetes, controlled, with retinopathy    Type 2 diabetes initially started on insulin and was recently discontinued to start metformin. Since starting metformin she has experienced gastrointestinal distress and stomach pains. She discontinued taking the metformin approximately 2 days ago. Discontinue metformin. Start glipizide. Monitor blood sugar at home closely for the first 2 weeks. Obtain A1c. Follow-up blood sugar numbers in approximately 2 weeks or sooner if needed.      Relevant Medications   glipiZIDE (GLUCOTROL) 5 MG tablet   Other Relevant Orders   Hemoglobin A1c (Completed)

## 2014-10-28 ENCOUNTER — Telehealth: Payer: Self-pay | Admitting: Family

## 2014-10-28 NOTE — Telephone Encounter (Signed)
Please inform patient that her A1c is 5.8 which is excellent control of her diabetes. Therefore she can decrease the number of times daily she is taking her blood sugars following starting the new medication. She can take her sugars 2-3 times per week after the first week on the new medication. Please have her follow up for an A1c in 3 months or sooner.

## 2014-10-30 NOTE — Telephone Encounter (Signed)
Notified patient.

## 2014-10-30 NOTE — Telephone Encounter (Signed)
LVM for pt to call back.

## 2014-12-17 ENCOUNTER — Telehealth: Payer: Self-pay | Admitting: Family

## 2014-12-17 NOTE — Telephone Encounter (Signed)
Pt aware and understands

## 2014-12-17 NOTE — Telephone Encounter (Signed)
No changes are needed at this time. We will recheck her A1c in December to determine any new changes.

## 2014-12-17 NOTE — Telephone Encounter (Signed)
Has been taking Glipizide for about 1 month.  Wondering if dosage should be increased.  Gluscose has been 129-151 or so.  Please advise.

## 2014-12-28 ENCOUNTER — Other Ambulatory Visit: Payer: Self-pay | Admitting: Family

## 2014-12-28 NOTE — Telephone Encounter (Signed)
Left msg on triage she is out of her Glipizide needing refills sent to [facility],,,/lmb

## 2015-01-06 ENCOUNTER — Encounter (HOSPITAL_COMMUNITY): Payer: Self-pay

## 2015-01-06 ENCOUNTER — Emergency Department (HOSPITAL_COMMUNITY): Payer: BLUE CROSS/BLUE SHIELD

## 2015-01-06 ENCOUNTER — Emergency Department (HOSPITAL_COMMUNITY)
Admission: EM | Admit: 2015-01-06 | Discharge: 2015-01-06 | Disposition: A | Discharge status: Home / Self Care | Payer: BLUE CROSS/BLUE SHIELD | Attending: Emergency Medicine | Admitting: Emergency Medicine

## 2015-01-06 DIAGNOSIS — D649 Anemia, unspecified: Secondary | ICD-10-CM | POA: Insufficient documentation

## 2015-01-06 DIAGNOSIS — J45901 Unspecified asthma with (acute) exacerbation: Secondary | ICD-10-CM | POA: Insufficient documentation

## 2015-01-06 DIAGNOSIS — Z87442 Personal history of urinary calculi: Secondary | ICD-10-CM | POA: Insufficient documentation

## 2015-01-06 DIAGNOSIS — J069 Acute upper respiratory infection, unspecified: Secondary | ICD-10-CM | POA: Insufficient documentation

## 2015-01-06 DIAGNOSIS — Z8742 Personal history of other diseases of the female genital tract: Secondary | ICD-10-CM | POA: Insufficient documentation

## 2015-01-06 DIAGNOSIS — Z79899 Other long term (current) drug therapy: Secondary | ICD-10-CM | POA: Insufficient documentation

## 2015-01-06 DIAGNOSIS — Z8619 Personal history of other infectious and parasitic diseases: Secondary | ICD-10-CM | POA: Insufficient documentation

## 2015-01-06 DIAGNOSIS — E119 Type 2 diabetes mellitus without complications: Secondary | ICD-10-CM | POA: Insufficient documentation

## 2015-01-06 MED ORDER — HYDROCODONE-ACETAMINOPHEN 5-325 MG PO TABS: ORAL_TABLET | ORAL | Status: DC

## 2015-01-06 MED ORDER — AZITHROMYCIN 250 MG PO TABS: ORAL_TABLET | ORAL | Status: DC

## 2015-01-06 NOTE — ED Notes (Signed)
Pt. Coming from home with c/o chest and nasal congestion x3 weeks. Pt. Reports nothing she has tried has helped. Pt. Hx of diabetes, so she was concerned. Pt. Reports mucus from nose and cough is brown with some blood noticed.

## 2015-01-06 NOTE — ED Provider Notes (Signed)
CSN: [id number]     Arrival date & time 01/06/15  4782 History  By signing my name below, I, Eustaquio Maize, attest that this documentation has been prepared under the direction and in the presence of [employer], PA-C. Electronically Signed: Eustaquio Maize, ED Scribe. 01/06/2015. 11:02 AM.   Chief Complaint  Patient presents with  . Nasal Congestion   The history is provided by the patient. No language interpreter was used.     HPI Comments: Carrie Neal is a 44 y.o. female who presents to the Emergency Department complaining of gradual onset, constant, nasal congestion x 3 weeks. Pt also complains of chest congestion, productive cough with brown phlegm and small amounts of blood, chest burning with coughing, chills, and shortness of breath. She has been taking Robutussion DM without relief. Denies fever, leg swelling, nausea, vomiting, ear pain, or any other associated symptoms. Pt admits that she has not been checking her glucose level recently. No hx DVT/PE, estrogen intake, or cardiac issues. Non smoker.   Past Medical History  Diagnosis Date  . Dysfunctional uterine bleeding 12/2013  . Anemia   . Diabetes mellitus without complication ([place])   . Asthma   . Chicken pox   . Kidney stones    Past Surgical History  Procedure Laterality Date  . Cholecystectomy    . Tubal ligation    . Umbilical hernia repair      as child  . Dilatation & curettage/hysteroscopy with myosure N/A 03/30/2014    Procedure: DILATATION & CURETTAGE/HYSTEROSCOPY WITH MYOSURE and placement of Mirena Intrauterine device;  Surgeon: Alinda Dooms, MD;  Location: [facility];  Service: Gynecology;  Laterality: N/A;   Family History  Problem Relation Age of Onset  . Diabetes Mother   . Diabetes Cousin   . Diabetes Maternal Grandmother    Social History  Substance Use Topics  . Smoking status: Never Smoker   . Smokeless tobacco: Never Used  . Alcohol Use: No   OB History    Gravida Para Term Preterm AB  TAB SAB Ectopic Multiple Living       0  0   6     Review of Systems  A complete 10 system review of systems was obtained and all systems are negative except as noted in the HPI and PMH.   Allergies  Codeine  Home Medications   Prior to Admission medications   Medication Sig Start Date End Date Taking? Authorizing Provider  blood glucose meter kit and supplies KIT Dispense based on patient and insurance preference. Use up to four times daily as directed. (FOR ICD-9 250.00, 250.01). 04/16/14   Kinnie Feil, MD  Cyanocobalamin (B-12 PO) Take 1 tablet by mouth daily.     Historical Provider, MD  glipiZIDE (GLUCOTROL) 5 MG tablet TAKE 1 TABLET (5 MG TOTAL) BY MOUTH 2 (TWO) TIMES DAILY BEFORE A MEAL. 12/28/14   Golden Circle, FNP  glucose blood (ONE TOUCH ULTRA TEST) test strip Use as instructed 04/27/14   Golden Circle, FNP  Insulin Pen Needle 32G X 4 MM MISC 1 packet by Does not apply route as needed. 04/16/14   Kinnie Feil, MD  Multiple Vitamin (MULTIVITAMIN WITH MINERALS) TABS tablet Take 1 tablet by mouth daily.    Historical Provider, MD   Triage Vitals: BP 115/78 mmHg  Pulse 89  Temp(Src) 97.9 F (36.6 C) (Oral)  Resp 14  Ht $R'5\' 7"'tH$  (1.702 m)  Wt 253 lb (114.76 kg)  BMI 39.62  kg/m2  SpO2 100%  LMP 01/06/2015   Physical Exam  Constitutional: She is oriented to person, place, and time. She appears well-developed and well-nourished. No distress.  HENT:  Head: Normocephalic and atraumatic.  Mouth/Throat: Oropharynx is clear and moist.  Eyes: Conjunctivae and EOM are normal. Pupils are equal, round, and reactive to light.  Neck: Normal range of motion. Neck supple. No tracheal deviation present.  Cardiovascular: Normal rate, regular rhythm and intact distal pulses.   Pulmonary/Chest: Effort normal and breath sounds normal. No respiratory distress.  Abdominal: Soft. There is no tenderness.  Musculoskeletal: Normal range of motion.  Neurological: She is alert and  oriented to person, place, and time.  Skin: Skin is warm and dry. She is not diaphoretic.  Psychiatric: She has a normal mood and affect. Her behavior is normal.  Nursing note and vitals reviewed.   ED Course  Procedures (including critical care time)  DIAGNOSTIC STUDIES: Oxygen Saturation is 100% on RA, normal by my interpretation.    COORDINATION OF CARE: 11:01 AM-Discussed treatment plan which includes Rx Vicodin and antibiotics with pt at bedside and pt agreed to plan.   Labs Review Labs Reviewed - No data to display  Imaging Review Dg Chest 2 View  01/06/2015  CLINICAL DATA:  Cough and chest congestion. EXAM: CHEST  2 VIEW COMPARISON:  04/14/2014 FINDINGS: The heart size and mediastinal contours are within normal limits. Both lungs are clear. The visualized skeletal structures are unremarkable. IMPRESSION: Normal exam. Electronically Signed   By: Lorriane Shire M.D.   On: 01/06/2015 11:00   I have personally reviewed and evaluated these images as part of my medical decision-making.   EKG Interpretation None      MDM   Final diagnoses:  URI (upper respiratory infection)    Filed Vitals:   01/06/15 1003 01/06/15 1114  BP: 115/78 103/64  Pulse: 89 83  Temp: 97.9 F (36.6 C) 98 F (36.7 C)  TempSrc: Oral Oral  Resp: 14 17  Height: $Remove'5\' 7"'cAaEliV$  (1.702 m)   Weight: 253 lb (114.76 kg)   SpO2: 100% 100%   Carrie Neal is 44 y.o. female presenting with rhinorrhea, productive cough pleuritic chest pain worsening over the course of 3 weeks. Lung sounds are clear to auscultation, she is saturating well on room air. No tachycardia. Patient is afebrile. Chest x-ray is without infiltrate. Considering the length of this person symptoms and the fact that she is a diabetic, will start azithromycin for upper respiratory.  Evaluation does not show pathology that would require ongoing emergent intervention or inpatient treatment. Pt is hemodynamically stable and mentating appropriately.  Discussed findings and plan with patient/guardian, who agrees with care plan. All questions answered. Return precautions discussed and outpatient follow up given.   Discharge Medication List as of 01/06/2015 11:06 AM    START taking these medications   Details  azithromycin (ZITHROMAX Z-PAK) 250 MG tablet 2 po day one, then 1 daily x 4 days, Print    HYDROcodone-acetaminophen (NORCO/VICODIN) 5-325 MG tablet Take 1-2 tablets by mouth every 6 hours as needed for pain and/or cough., Print         I personally performed the services described in this documentation, which was scribed in my presence. The recorded information has been reviewed and is accurate.      Monico Blitz, PA-C 01/06/15 1626  Charlesetta Shanks, MD 01/16/15 [phone]

## 2015-01-06 NOTE — Discharge Instructions (Signed)
Use nasal saline (you can try Arm and Hammer Simply Saline) at least 4 times a day, use saline 5-10 minutes before using the fluticasone (flonase) nasal spray  Do not use Afrin (Oxymetazoline)  Rest, wash hands frequently  and drink plenty of water.  You may try counter medication such as Mucinex or Sudafed decongestant.  Please follow with your primary care doctor in the next 2 days for a check-up. They must obtain records for further management.   Do not hesitate to return to the Emergency Department for any new, worsening or concerning symptoms.    Upper Respiratory Infection, Adult Most upper respiratory infections (URIs) are a viral infection of the air passages leading to the lungs. A URI affects the nose, throat, and upper air passages. The most common type of URI is nasopharyngitis and is typically referred to as "the common cold." URIs run their course and usually go away on their own. Most of the time, a URI does not require medical attention, but sometimes a bacterial infection in the upper airways can follow a viral infection. This is called a secondary infection. Sinus and middle ear infections are common types of secondary upper respiratory infections. Bacterial pneumonia can also complicate a URI. A URI can worsen asthma and chronic obstructive pulmonary disease (COPD). Sometimes, these complications can require emergency medical care and may be life threatening.  CAUSES Almost all URIs are caused by viruses. A virus is a type of germ and can spread from one person to another.  RISKS FACTORS You may be at risk for a URI if:   You smoke.   You have chronic heart or lung disease.  You have a weakened defense (immune) system.   You are very young or very old.   You have nasal allergies or asthma.  You work in crowded or poorly ventilated areas.  You work in health care facilities or schools. SIGNS AND SYMPTOMS  Symptoms typically develop 2-3 days after you come in  contact with a cold virus. Most viral URIs last 7-10 days. However, viral URIs from the influenza virus (flu virus) can last 14-18 days and are typically more severe. Symptoms may include:   Runny or stuffy (congested) nose.   Sneezing.   Cough.   Sore throat.   Headache.   Fatigue.   Fever.   Loss of appetite.   Pain in your forehead, behind your eyes, and over your cheekbones (sinus pain).  Muscle aches.  DIAGNOSIS  Your health care provider may diagnose a URI by:  Physical exam.  Tests to check that your symptoms are not due to another condition such as:  Strep throat.  Sinusitis.  Pneumonia.  Asthma. TREATMENT  A URI goes away on its own with time. It cannot be cured with medicines, but medicines may be prescribed or recommended to relieve symptoms. Medicines may help:  Reduce your fever.  Reduce your cough.  Relieve nasal congestion. HOME CARE INSTRUCTIONS   Take medicines only as directed by your health care provider.   Gargle warm saltwater or take cough drops to comfort your throat as directed by your health care provider.  Use a warm mist humidifier or inhale steam from a shower to increase air moisture. This may make it easier to breathe.  Drink enough fluid to keep your urine clear or pale yellow.   Eat soups and other clear broths and maintain good nutrition.   Rest as needed.   Return to work when your temperature has returned  to normal or as your health care provider advises. You may need to stay home longer to avoid infecting others. You can also use a face mask and careful hand washing to prevent spread of the virus.  Increase the usage of your inhaler if you have asthma.   Do not use any tobacco products, including cigarettes, chewing tobacco, or electronic cigarettes. If you need help quitting, ask your health care provider. PREVENTION  The best way to protect yourself from getting a cold is to practice good hygiene.    Avoid oral or hand contact with people with cold symptoms.   Wash your hands often if contact occurs.  There is no clear evidence that vitamin C, vitamin E, echinacea, or exercise reduces the chance of developing a cold. However, it is always recommended to get plenty of rest, exercise, and practice good nutrition.  SEEK MEDICAL CARE IF:   You are getting worse rather than better.   Your symptoms are not controlled by medicine.   You have chills.  You have worsening shortness of breath.  You have brown or red mucus.  You have yellow or brown nasal discharge.  You have pain in your face, especially when you bend forward.  You have a fever.  You have swollen neck glands.  You have pain while swallowing.  You have white areas in the back of your throat. SEEK IMMEDIATE MEDICAL CARE IF:   You have severe or persistent:  Headache.  Ear pain.  Sinus pain.  Chest pain.  You have chronic lung disease and any of the following:  Wheezing.  Prolonged cough.  Coughing up blood.  A change in your usual mucus.  You have a stiff neck.  You have changes in your:  Vision.  Hearing.  Thinking.  Mood. MAKE SURE YOU:   Understand these instructions.  Will watch your condition.  Will get help right away if you are not doing well or get worse.   This information is not intended to replace advice given to you by your health care provider. Make sure you discuss any questions you have with your health care provider.   Document Released: 07/29/2000 Document Revised: 06/19/2014 Document Reviewed: 05/10/2013 Elsevier Interactive Patient Education [employer].

## 2015-01-31 ENCOUNTER — Telehealth: Payer: Self-pay

## 2015-01-31 NOTE — Telephone Encounter (Signed)
Left message on machine for pt to return my call regarding flu vaccination

## 2015-02-12 DIAGNOSIS — Z794 Long term (current) use of insulin: Secondary | ICD-10-CM | POA: Diagnosis not present

## 2015-02-12 DIAGNOSIS — Z5181 Encounter for therapeutic drug level monitoring: Secondary | ICD-10-CM | POA: Diagnosis not present

## 2015-02-12 DIAGNOSIS — E1165 Type 2 diabetes mellitus with hyperglycemia: Secondary | ICD-10-CM | POA: Diagnosis not present

## 2015-04-04 ENCOUNTER — Other Ambulatory Visit: Payer: Self-pay | Admitting: General Practice

## 2015-04-04 MED ORDER — GLIPIZIDE 5 MG PO TABS: ORAL_TABLET | ORAL | Status: DC

## 2015-05-20 ENCOUNTER — Other Ambulatory Visit: Payer: Self-pay | Admitting: Family

## 2015-05-28 ENCOUNTER — Encounter: Payer: Self-pay | Admitting: Family

## 2015-05-28 ENCOUNTER — Ambulatory Visit (INDEPENDENT_AMBULATORY_CARE_PROVIDER_SITE_OTHER): Payer: BLUE CROSS/BLUE SHIELD | Admitting: Family

## 2015-05-28 VITALS — BP 118/70 | HR 98 | Temp 98.1°F | Resp 16 | Ht 66.0 in | Wt 264.0 lb

## 2015-05-28 DIAGNOSIS — E11319 Type 2 diabetes mellitus with unspecified diabetic retinopathy without macular edema: Secondary | ICD-10-CM | POA: Diagnosis not present

## 2015-05-28 MED ORDER — INVOKANA 100 MG PO TABS: 100.0000 mg | ORAL_TABLET | Freq: Every day | ORAL | Status: DC

## 2015-05-28 NOTE — Progress Notes (Signed)
Pre visit review using our clinic review tool, if applicable. No additional management support is needed unless otherwise documented below in the visit note. 

## 2015-05-28 NOTE — Patient Instructions (Signed)
Thank you for choosing [facility].  Summary/Instructions:  Your prescription(s) have been submitted to your pharmacy or been printed and provided for you. Please take as directed and contact our office if you believe you are having problem(s) with the medication(s) or have any questions.  If your symptoms worsen or fail to improve, please contact our office for further instruction, or in case of emergency go directly to the emergency room at the closest medical facility.     

## 2015-05-28 NOTE — Progress Notes (Signed)
Subjective:    Patient ID: Carrie Neal, female    DOB: 16-Nov-1970, 45 y.o.   MRN: [id number]  Chief Complaint  Patient presents with  . Weight Gain    says that since she has been on the glipizide she has gained weight and does not feel good, her sugars have been about 300    HPI:  Carrie Neal is a 46 y.o. female who  has a past medical history of Dysfunctional uterine bleeding (12/2013); Anemia; Diabetes mellitus without complication ([place]); Asthma; Chicken pox; and Kidney stones. and presents today for a follow up office visit.   1.) Type 2 diabetes - Currently maintained on glipizide. Reports taking the mediation as prescribed. States that she has gained weight since being on the glipizide and that her blood sugars have been around 300. She states that she is having eat more than she is used to eating to keep her blood sugars from dropping. She is not currently exercising.   Wt Readings from Last 3 Encounters:  05/28/15 264 lb (119.75 kg)  01/06/15 253 lb (114.76 kg)  10/26/14 253 lb (114.76 kg)    Allergies  Allergen Reactions  . Codeine Itching     Current Outpatient Prescriptions on File Prior to Visit  Medication Sig Dispense Refill  . blood glucose meter kit and supplies KIT Dispense based on patient and insurance preference. Use up to four times daily as directed. (FOR ICD-9 250.00, 250.01). 1 each 0  . Cyanocobalamin (B-12 PO) Take 1 tablet by mouth daily.     [name] Kitchen HYDROcodone-acetaminophen (NORCO/VICODIN) 5-325 MG tablet Take 1-2 tablets by mouth every 6 hours as needed for pain and/or cough. 7 tablet 0  . Insulin Pen Needle 32G X 4 MM MISC 1 packet by Does not apply route as needed. 100 each 2  . Multiple Vitamin (MULTIVITAMIN WITH MINERALS) TABS tablet Take 1 tablet by mouth daily.    . ONE TOUCH ULTRA TEST test strip USE AS INSTRUCTED 100 each 1   No current facility-administered medications on file prior to visit.     Past Surgical History  Procedure Laterality  Date  . Cholecystectomy    . Tubal ligation    . Umbilical hernia repair      as child  . Dilatation & curettage/hysteroscopy with myosure N/A 03/30/2014    Procedure: DILATATION & CURETTAGE/HYSTEROSCOPY WITH MYOSURE and placement of Mirena Intrauterine device;  Surgeon: Alinda Dooms, MD;  Location: [facility];  Service: Gynecology;  Laterality: N/A;     Review of Systems  Constitutional: Positive for unexpected weight change.  Eyes:       Negative for changes in vision.  Respiratory: Negative for chest tightness and shortness of breath.   Cardiovascular: Negative for chest pain, palpitations and leg swelling.  Endocrine: Negative for polydipsia, polyphagia and polyuria.  Neurological: Negative for dizziness and weakness.      Objective:    BP 118/70 mmHg  Pulse 98  Temp(Src) 98.1 F (36.7 C) (Oral)  Resp 16  Ht _0  (1.676 m)  Wt 264 lb (119.75 kg)  BMI 42.63 kg/m2  SpO2 99% Nursing note and vital signs reviewed.  Physical Exam  Constitutional: She is oriented to person, place, and time. She appears well-developed and well-nourished. No distress.  Cardiovascular: Normal rate, regular rhythm, normal heart sounds and intact distal pulses.   Pulmonary/Chest: Effort normal and breath sounds normal.  Neurological: She is alert and oriented to person, place, and time.  Skin: Skin is  warm and dry.  Psychiatric: She has a normal mood and affect. Her behavior is normal. Judgment and thought content normal.       Assessment & Plan:   Problem List Items Addressed This Visit      Endocrine   Type 2 diabetes, controlled, with retinopathy ([place]) - Primary    Type 2 diabetes with worsening control of blood sugars given home readings in the 300s most likely related to recent weight gain of approximately 10 pounds. Discontinue glipizide. Start Invokan with most recent GFR >90.Continue to monitor blood sugars at home and follow up in 1 month or sooner for diabetic foot exam and A1c.        Relevant Medications   INVOKANA 100 MG TABS tablet       I have discontinued Ms. Merryfield's azithromycin and glipiZIDE. I am also having her start on INVOKANA. Additionally, I am having her maintain her multivitamin with minerals, Cyanocobalamin (B-12 PO), Insulin Pen Needle, blood glucose meter kit and supplies, HYDROcodone-acetaminophen, and ONE TOUCH ULTRA TEST.   Meds ordered this encounter  Medications  . INVOKANA 100 MG TABS tablet    Sig: Take 1 tablet (100 mg total) by mouth daily before breakfast.    Dispense:  30 tablet    Refill:  0    Order Specific Question:  Supervising Provider    Answer:  Pricilla Holm A [[id number]]     Follow-up: Return in about 1 month (around 06/27/2015) for Diabetes .  Mauricio Po, FNP

## 2015-05-28 NOTE — Assessment & Plan Note (Addendum)
Type 2 diabetes with worsening control of blood sugars given home readings in the 300s most likely related to recent weight gain of approximately 10 pounds. Discontinue glipizide. Start Invokan with most recent GFR >90.Continue to monitor blood sugars at home and follow up in 1 month or sooner for diabetic foot exam and A1c.

## 2015-06-24 ENCOUNTER — Other Ambulatory Visit: Payer: Self-pay | Admitting: Family

## 2015-07-03 ENCOUNTER — Other Ambulatory Visit: Payer: Self-pay

## 2015-07-03 MED ORDER — ONETOUCH DELICA LANCETS 33G MISC: Status: DC

## 2015-07-26 ENCOUNTER — Other Ambulatory Visit: Payer: Self-pay | Admitting: Family

## 2015-08-15 ENCOUNTER — Telehealth: Payer: Self-pay | Admitting: *Deleted

## 2015-08-15 MED ORDER — GABAPENTIN 300 MG PO CAPS: ORAL_CAPSULE | ORAL | Status: DC

## 2015-08-15 MED ORDER — FLUCONAZOLE 150 MG PO TABS: 150.0000 mg | ORAL_TABLET | Freq: Once | ORAL | Status: DC

## 2015-08-15 NOTE — Telephone Encounter (Signed)
Medication sent to pharmacy  

## 2015-08-15 NOTE — Telephone Encounter (Signed)
Notified pt rx has been sent to pharmacy. Pt also want to inform Marya Amsler the Neurotherapy pain has been unbearable past couple weeks. Wanting to know what she can take...Carrie Neal

## 2015-08-15 NOTE — Telephone Encounter (Signed)
Pt left msg on triage stating the Invokana has given her a yeast infection. She has tried the [employer] but its not helping would like rx for diflucan sent to pharmacy...Carrie Neal

## 2015-08-15 NOTE — Telephone Encounter (Signed)
I have sent gabapentin to her pharmacy to help with the neuropathy. Please have her schedule an appointment for diabetes follow up within the next couple of weeks.

## 2015-08-16 NOTE — Telephone Encounter (Signed)
Notified pt w/Greg response. Made appt for 09/05/15...Johny Chess

## 2015-08-20 ENCOUNTER — Encounter (HOSPITAL_COMMUNITY): Payer: Self-pay | Admitting: Emergency Medicine

## 2015-08-20 ENCOUNTER — Ambulatory Visit (HOSPITAL_COMMUNITY)
Admission: EM | Admit: 2015-08-20 | Discharge: 2015-08-20 | Disposition: A | Discharge status: Nursing Home (Includes ICF) | Payer: BLUE CROSS/BLUE SHIELD | Source: Home / Self Care

## 2015-08-20 ENCOUNTER — Emergency Department (HOSPITAL_BASED_OUTPATIENT_CLINIC_OR_DEPARTMENT_OTHER)
Admit: 2015-08-20 | Discharge: 2015-08-20 | Disposition: A | Discharge status: Home / Self Care | Payer: BLUE CROSS/BLUE SHIELD | Attending: Emergency Medicine | Admitting: Emergency Medicine

## 2015-08-20 ENCOUNTER — Emergency Department (HOSPITAL_COMMUNITY)
Admission: EM | Admit: 2015-08-20 | Discharge: 2015-08-20 | Disposition: A | Discharge status: Home / Self Care | Payer: BLUE CROSS/BLUE SHIELD | Attending: Emergency Medicine | Admitting: Emergency Medicine

## 2015-08-20 DIAGNOSIS — Z794 Long term (current) use of insulin: Secondary | ICD-10-CM | POA: Diagnosis not present

## 2015-08-20 DIAGNOSIS — J45909 Unspecified asthma, uncomplicated: Secondary | ICD-10-CM | POA: Insufficient documentation

## 2015-08-20 DIAGNOSIS — R600 Localized edema: Secondary | ICD-10-CM | POA: Diagnosis not present

## 2015-08-20 DIAGNOSIS — Y929 Unspecified place or not applicable: Secondary | ICD-10-CM | POA: Insufficient documentation

## 2015-08-20 DIAGNOSIS — M79609 Pain in unspecified limb: Secondary | ICD-10-CM | POA: Diagnosis not present

## 2015-08-20 DIAGNOSIS — E119 Type 2 diabetes mellitus without complications: Secondary | ICD-10-CM | POA: Diagnosis not present

## 2015-08-20 DIAGNOSIS — R0602 Shortness of breath: Secondary | ICD-10-CM | POA: Insufficient documentation

## 2015-08-20 DIAGNOSIS — X58XXXA Exposure to other specified factors, initial encounter: Secondary | ICD-10-CM | POA: Diagnosis not present

## 2015-08-20 DIAGNOSIS — S86911A Strain of unspecified muscle(s) and tendon(s) at lower leg level, right leg, initial encounter: Secondary | ICD-10-CM | POA: Diagnosis not present

## 2015-08-20 DIAGNOSIS — Y999 Unspecified external cause status: Secondary | ICD-10-CM | POA: Diagnosis not present

## 2015-08-20 DIAGNOSIS — S8991XA Unspecified injury of right lower leg, initial encounter: Secondary | ICD-10-CM | POA: Diagnosis present

## 2015-08-20 DIAGNOSIS — Y939 Activity, unspecified: Secondary | ICD-10-CM | POA: Insufficient documentation

## 2015-08-20 LAB — CBC
HEMATOCRIT: 32.1 % — AB (ref 36.0–46.0)
HEMOGLOBIN: 9.3 g/dL — AB (ref 12.0–15.0)
MCH: 20.2 pg — AB (ref 26.0–34.0)
MCHC: 29 g/dL — AB (ref 30.0–36.0)
MCV: 69.8 fL — AB (ref 78.0–100.0)
Platelets: 351 10*3/uL (ref 150–400)
RBC: 4.6 MIL/uL (ref 3.87–5.11)
RDW: 18.4 % — ABNORMAL HIGH (ref 11.5–15.5)
WBC: 3.8 10*3/uL — ABNORMAL LOW (ref 4.0–10.5)

## 2015-08-20 LAB — BASIC METABOLIC PANEL
Anion gap: 5 (ref 5–15)
BUN: 6 mg/dL (ref 6–20)
CHLORIDE: 108 mmol/L (ref 101–111)
CO2: 22 mmol/L (ref 22–32)
CREATININE: 0.64 mg/dL (ref 0.44–1.00)
Calcium: 10.5 mg/dL — ABNORMAL HIGH (ref 8.9–10.3)
GFR calc non Af Amer: >60 mL/min (ref >60)
Glucose, Bld: 102 mg/dL — ABNORMAL HIGH (ref 65–99)
Potassium: 3.6 mmol/L (ref 3.5–5.1)
Sodium: 135 mmol/L (ref 135–145)

## 2015-08-20 LAB — POCT URINALYSIS DIP (DEVICE)
BILIRUBIN URINE: NEGATIVE
Glucose, UA: 250 mg/dL — AB
HGB URINE DIPSTICK: NEGATIVE
KETONES UR: NEGATIVE mg/dL
LEUKOCYTES UA: NEGATIVE
NITRITE: NEGATIVE
Protein, ur: NEGATIVE mg/dL
Urobilinogen, UA: 0.2 mg/dL (ref 0.0–1.0)
pH: 5.5 (ref 5.0–8.0)

## 2015-08-20 MED ORDER — TRAMADOL HCL 50 MG PO TABS: 50.0000 mg | ORAL_TABLET | Freq: Four times a day (QID) | ORAL | Status: DC | PRN

## 2015-08-20 MED ORDER — OXYCODONE-ACETAMINOPHEN 5-325 MG PO TABS
1.0000 | ORAL_TABLET | Freq: Once | ORAL | Status: AC
  Administered 2015-08-20: 1 via ORAL
  Filled 2015-08-20: qty 1

## 2015-08-20 NOTE — ED Notes (Signed)
Pt sts left leg pain and some swelling; pt sts some pain in lower back area and some SOB x 4 days

## 2015-08-20 NOTE — ED Provider Notes (Signed)
CSN: [id number]     Arrival date & time 08/20/15  1109 History   None    Chief Complaint  Patient presents with  . Leg Pain  . Back Pain   (Consider location/radiation/quality/duration/timing/severity/associated sxs/prior Treatment) HPI Comments: Patient c/o right lower extremity pain and edema.  She states the pain is traveling up into her thigh area.  She also states she had some chest pain last week and is having DOE and SOB.  She is concerned she may have a blood clot in her right leg because she is on BCP's and she is obese. She states she has family hx of blood clots.  Patient is a 45 y.o. female presenting with leg pain and back pain. The history is provided by the patient.  Leg Pain Location:  Leg Time since incident:  4 days Injury: no   Leg location:  R leg Pain details:    Quality:  Aching and shooting   Radiates to:  R leg   Severity:  Moderate   Onset quality:  Gradual   Duration:  4 days   Timing:  Constant   Progression:  Worsening Chronicity:  New Dislocation: no   Foreign body present:  No foreign bodies Prior injury to area:  No Relieved by:  Nothing Worsened by:  Nothing tried Associated symptoms: back pain and stiffness   Risk factors: obesity   Back Pain Associated symptoms: leg pain     Past Medical History  Diagnosis Date  . Dysfunctional uterine bleeding 12/2013  . Anemia   . Diabetes mellitus without complication ([place])   . Asthma   . Chicken pox   . Kidney stones    Past Surgical History  Procedure Laterality Date  . Cholecystectomy    . Tubal ligation    . Umbilical hernia repair      as child  . Dilatation & curettage/hysteroscopy with myosure N/A 03/30/2014    Procedure: DILATATION & CURETTAGE/HYSTEROSCOPY WITH MYOSURE and placement of Mirena Intrauterine device;  Surgeon: Alinda Dooms, MD;  Location: [facility];  Service: Gynecology;  Laterality: N/A;   Family History  Problem Relation Age of Onset  . Diabetes Mother   . Diabetes  Cousin   . Diabetes Maternal Grandmother    Social History  Substance Use Topics  . Smoking status: Never Smoker   . Smokeless tobacco: Never Used  . Alcohol Use: No   OB History    Gravida Para Term Preterm AB TAB SAB Ectopic Multiple Living       0  0   6     Review of Systems  Constitutional: Negative.   HENT: Negative.   Eyes: Negative.   Respiratory: Negative.   Cardiovascular: Negative.   Gastrointestinal: Negative.   Endocrine: Negative.   Genitourinary: Negative.   Musculoskeletal: Positive for back pain, gait problem and stiffness.  Skin: Negative.   Allergic/Immunologic: Negative.   Neurological: Negative.   Psychiatric/Behavioral: Negative.     Allergies  Codeine  Home Medications   Prior to Admission medications   Medication Sig Start Date End Date Taking? Authorizing Provider  INVOKANA 100 MG TABS tablet TAKE 1 TABLET BY MOUTH DAILY BEFORE BREAKFAST 07/26/15  Yes Golden Circle, FNP  blood glucose meter kit and supplies KIT Dispense based on patient and insurance preference. Use up to four times daily as directed. (FOR ICD-9 250.00, 250.01). 04/16/14   Kinnie Feil, MD  Cyanocobalamin (B-12 PO) Take 1 tablet by mouth daily.  Historical Provider, MD  fluconazole (DIFLUCAN) 150 MG tablet Take 1 tablet (150 mg total) by mouth once. May repeat in 48 hours. 08/15/15   Golden Circle, FNP  gabapentin (NEURONTIN) 300 MG capsule Take 1 capsule by mouth for 1 day then increase to 1 capsule by mouth twice daily x 1 day then increase to 1 capsule by mouth 3x times daily 08/15/15   Golden Circle, FNP  HYDROcodone-acetaminophen (NORCO/VICODIN) 5-325 MG tablet Take 1-2 tablets by mouth every 6 hours as needed for pain and/or cough. 01/06/15   Nicole Pisciotta, PA-C  Insulin Pen Needle 32G X 4 MM MISC 1 packet by Does not apply route as needed. 04/16/14   Kinnie Feil, MD  Multiple Vitamin (MULTIVITAMIN WITH MINERALS) TABS tablet Take 1 tablet by mouth daily.     Historical Provider, MD  ONE TOUCH ULTRA TEST test strip USE AS INSTRUCTED 05/21/15   Golden Circle, FNP  [facility] DELICA LANCETS 68G MISC Use as directed to check sugar 4 times daily 07/03/15   Golden Circle, FNP   Meds Ordered and Administered this Visit  Medications - No data to display  BP 149/74 mmHg  Pulse 91  Temp(Src) 98.7 F (37.1 C) (Oral)  Resp 18  SpO2 100%  LMP 07/11/2015 (Approximate) No data found.   Physical Exam  Constitutional: She is oriented to person, place, and time. She appears well-developed and well-nourished.  HENT:  Head: Normocephalic and atraumatic.  Mouth/Throat: Oropharynx is clear and moist.  Eyes: Conjunctivae are normal. Pupils are equal, round, and reactive to light.  Cardiovascular: Normal rate, regular rhythm and normal heart sounds.   Pulmonary/Chest: Effort normal and breath sounds normal.  Musculoskeletal:  Right calf pain with palpation.  Positive Homan's right LE. Right calf more swollen than left.    Neurological: She is alert and oriented to person, place, and time.    ED Course  Procedures (including critical care time)  Labs Review Labs Reviewed  POCT URINALYSIS DIP (DEVICE) - Abnormal; Notable for the following:    Glucose, UA 250 (*)    All other components within normal limits    Imaging Review No results found.   Visual Acuity Review  Right Eye Distance:   Left Eye Distance:   Bilateral Distance:    Right Eye Near:   Left Eye Near:    Bilateral Near:         MDM  Right lower extremity edema  Shortness of Breath  Transfer to ED for evaluation of possible DVT right LE.      Lysbeth Penner, FNP 08/20/15 1210

## 2015-08-20 NOTE — ED Notes (Signed)
Patient transported to Ultrasound 

## 2015-08-20 NOTE — ED Provider Notes (Signed)
CSN: [id number]     Arrival date & time 08/20/15  1219 History   First MD Initiated Contact with Patient 08/20/15 1237     Chief Complaint  Patient presents with  . Leg Pain  . Shortness of Breath   (Consider location/radiation/quality/duration/timing/severity/associated sxs/prior Treatment) HPI 45 y.o. female with a hx of Kidney Stones, DM, presents to the Emergency Department today from UC due to unilateral leg swelling/pain. Pt was seen at UC earlier today and send to ED for evaluation of DVT. States pain began on Friday and feels like sharp sensation behind posterior calf. Has been constant. Notes pain 8/10. No relief with OTC remedies. Pt notes that she is on birth control. No recent long distance travel. No recent surgeries. No hx DVT/PE. Pt also notes SOB, but not as bad today. Pt states she has had back pain as well. No recent trauma. No lifting injury. States pain is bilateral low back. No loss of bowel or bladder function. No saddle anesthesia. Has hx Asthma. No CP/ABD pain. No fevers. No N/V/D. No other symptoms noted.    Past Medical History  Diagnosis Date  . Dysfunctional uterine bleeding 12/2013  . Anemia   . Diabetes mellitus without complication ([place])   . Asthma   . Chicken pox   . Kidney stones    Past Surgical History  Procedure Laterality Date  . Cholecystectomy    . Tubal ligation    . Umbilical hernia repair      as child  . Dilatation & curettage/hysteroscopy with myosure N/A 03/30/2014    Procedure: DILATATION & CURETTAGE/HYSTEROSCOPY WITH MYOSURE and placement of Mirena Intrauterine device;  Surgeon: Alinda Dooms, MD;  Location: [facility];  Service: Gynecology;  Laterality: N/A;   Family History  Problem Relation Age of Onset  . Diabetes Mother   . Diabetes Cousin   . Diabetes Maternal Grandmother    Social History  Substance Use Topics  . Smoking status: Never Smoker   . Smokeless tobacco: Never Used  . Alcohol Use: No   OB History    Gravida Para  Term Preterm AB TAB SAB Ectopic Multiple Living       0  0   6     Review of Systems ROS reviewed and all are negative for acute change except as noted in the HPI.  Allergies  Codeine  Home Medications   Prior to Admission medications   Medication Sig Start Date End Date Taking? Authorizing Provider  blood glucose meter kit and supplies KIT Dispense based on patient and insurance preference. Use up to four times daily as directed. (FOR ICD-9 250.00, 250.01). 04/16/14   Kinnie Feil, MD  Cyanocobalamin (B-12 PO) Take 1 tablet by mouth daily.     Historical Provider, MD  fluconazole (DIFLUCAN) 150 MG tablet Take 1 tablet (150 mg total) by mouth once. May repeat in 48 hours. 08/15/15   Golden Circle, FNP  gabapentin (NEURONTIN) 300 MG capsule Take 1 capsule by mouth for 1 day then increase to 1 capsule by mouth twice daily x 1 day then increase to 1 capsule by mouth 3x times daily 08/15/15   Golden Circle, FNP  HYDROcodone-acetaminophen (NORCO/VICODIN) 5-325 MG tablet Take 1-2 tablets by mouth every 6 hours as needed for pain and/or cough. 01/06/15   Nicole Pisciotta, PA-C  Insulin Pen Needle 32G X 4 MM MISC 1 packet by Does not apply route as needed. 04/16/14   Kinnie Feil, MD  Anastasio Auerbach  100 MG TABS tablet TAKE 1 TABLET BY MOUTH DAILY BEFORE BREAKFAST 07/26/15   Golden Circle, FNP  Multiple Vitamin (MULTIVITAMIN WITH MINERALS) TABS tablet Take 1 tablet by mouth daily.    Historical Provider, MD  ONE TOUCH ULTRA TEST test strip USE AS INSTRUCTED 05/21/15   Golden Circle, FNP  [facility] DELICA LANCETS 26J MISC Use as directed to check sugar 4 times daily 07/03/15   Golden Circle, FNP   BP 134/74 mmHg  Pulse 70  Temp(Src) 98.7 F (37.1 C) (Oral)  Resp 18  SpO2 100%  LMP 07/11/2015 (Approximate)   Physical Exam  Constitutional: She is oriented to person, place, and time. She appears well-developed and well-nourished.  HENT:  Head: Normocephalic and atraumatic.  Eyes: EOM  are normal. Pupils are equal, round, and reactive to light.  Neck: Neck supple.  Cardiovascular: Normal rate, regular rhythm, normal heart sounds and intact distal pulses.   No murmur heard. Pulmonary/Chest: Effort normal and breath sounds normal. No respiratory distress. She has no wheezes. She has no rales. She exhibits no tenderness.  Abdominal: Soft. Normal appearance and bowel sounds are normal. There is no tenderness. There is no rigidity, no rebound, no guarding, no tenderness at McBurney's point and negative Murphy's sign.  Musculoskeletal: Normal range of motion.       Lumbar back: Normal.  Paraspinous muscles TTP. ROM intact. Able to ambulate.  RLE with positive Homans. TTP posterior calf. Neurovascularly intact. Motor/sensory intact.   Neurological: She is alert and oriented to person, place, and time.  Skin: Skin is warm and dry.  Psychiatric: She has a normal mood and affect. Her behavior is normal. Thought content normal.  Nursing note and vitals reviewed.  ED Course  Procedures (including critical care time) Labs Review Labs Reviewed  CBC - Abnormal; Notable for the following:    WBC 3.8 (*)    Hemoglobin 9.3 (*)    HCT 32.1 (*)    MCV 69.8 (*)    MCH 20.2 (*)    MCHC 29.0 (*)    RDW 18.4 (*)    All other components within normal limits  BASIC METABOLIC PANEL - Abnormal; Notable for the following:    Glucose, Bld 102 (*)    Calcium 10.5 (*)    All other components within normal limits   Imaging Review No results found. I have personally reviewed and evaluated these images and lab results as part of my medical decision-making.  VASCULAR LAB PRELIMINARY PRELIMINARY PRELIMINARY PRELIMINARY  Right lower extremity venous duplex completed.   Preliminary report: There is no DVT or SVT noted in the right lower extremity.   KANADY, CANDACE, RVT 08/20/2015, 1:56 PM   EKG Interpretation   Date/Time:  Tuesday August 20 2015 12:24:53 EDT Ventricular Rate:  78 PR  Interval:  120 QRS Duration: 82 QT Interval:  366 QTC Calculation: 417 R Axis:   35 Text Interpretation:  Normal sinus rhythm Normal ECG No significant change  since last tracing Confirmed by FLOYD MD, Quillian Quince [phone]) on 08/20/2015  12:33:00 PM      MDM  I have reviewed and evaluated the relevant laboratory valuesI have reviewed and evaluated the relevant imaging studies. I have interpreted the relevant EKG.I have reviewed the relevant previous healthcare records.I have reviewed EMS Documentation.I obtained HPI from historian. Patient discussed with supervising physician  ED Course:  Assessment: Pt is a 65yF with hx Kidney Stones, DM who presents with unilateral leg pain since Friday. On exam,  pt in NAD. Nontoxic/nonseptic appearing. VSS. Afebrile. Lungs CTA. Heart RRR. Abdomen nontender soft. Right leg TTP with positive Homans. Labs otherwise unremarkable. [place] negative for RLE DVT. Likely calf strain. Discussed with supervising physician. Plan is to DC home with supportive therapy. At time of discharge, Patient is in no acute distress. Vital Signs are stable. Patient is able to ambulate. Patient able to tolerate PO.    Disposition/Plan:  DC Home Additional Verbal discharge instructions given and discussed with patient.  Pt Instructed to f/u with PCP in the next week for evaluation and treatment of symptoms. Return precautions given Pt acknowledges and agrees with plan  Supervising Physician Deno Etienne, DO   Final diagnoses:  Muscle strain of right lower leg, initial encounter     Shary Decamp, PA-C 08/20/15 [place], DO 08/20/15 1454

## 2015-08-20 NOTE — ED Notes (Signed)
The patient presented to the [facility] with a complaint of bilateral leg pain that started 4 days ago and the patient is concerned about DVTs. The patient also complained of bilateral lower back pain x 1 month that she believed to possibly be an infection.

## 2015-08-20 NOTE — Progress Notes (Signed)
VASCULAR LAB PRELIMINARY  PRELIMINARY  PRELIMINARY  PRELIMINARY  Right lower extremity venous duplex completed.    Preliminary report:  There is no DVT or SVT noted in the right lower extremity.   Ankit Degregorio, RVT 08/20/2015, 1:56 PM

## 2015-08-20 NOTE — Discharge Instructions (Signed)
Please read and follow all provided instructions.  Your diagnoses today include:  1. Muscle strain of right lower leg, initial encounter    Tests performed today include:  Vital signs. See below for your results today.   Medications prescribed:   Take as prescribed   Home care instructions:  Follow any educational materials contained in this packet.  You can use Ibuprofen 400mg  combined with Tylenol 1000mg  for pain relief every 6 hours. Do not exceed 4g of Tylenol in one 24 hour period. Use narcotics if pain uncontrolled with the aforementioned regiment.   Follow-up instructions: Please follow-up with your primary care provider for further evaluation of symptoms and treatment   Return instructions:   Please return to the Emergency Department if you do not get better, if you get worse, or new symptoms OR  - Fever (temperature greater than 101.67F)  - Bleeding that does not stop with holding pressure to the area    -Severe pain (please note that you may be more sore the day after your accident)  - Chest Pain  - Difficulty breathing  - Severe nausea or vomiting  - Inability to tolerate food and liquids  - Passing out  - Skin becoming red around your wounds  - Change in mental status (confusion or lethargy)  - New numbness or weakness     Please return if you have any other emergent concerns.  Additional Information:  Your vital signs today were: BP 122/75 mmHg   Pulse 75   Temp(Src) 98.7 F (37.1 C) (Oral)   Resp 10   SpO2 100%   LMP 07/11/2015 (Approximate) If your blood pressure (BP) was elevated above 135/85 this visit, please have this repeated by your doctor within one month. ---------------

## 2015-08-30 ENCOUNTER — Telehealth: Payer: Self-pay | Admitting: Family

## 2015-08-30 MED ORDER — TERCONAZOLE 0.4 % VA CREA: 1.0000 | TOPICAL_CREAM | Freq: Every day | VAGINAL | Status: DC

## 2015-08-30 NOTE — Telephone Encounter (Signed)
Pt called in and said that the yeast infection is not clearing up.  She took meds but didn't help , she has tried everything over the counter and hasnt worked.  She is very irritated and red.  What can she do?

## 2015-08-30 NOTE — Telephone Encounter (Signed)
Please advise 

## 2015-08-30 NOTE — Telephone Encounter (Signed)
Medication sent to pharmacy  

## 2015-09-05 ENCOUNTER — Encounter: Payer: Self-pay | Admitting: Family

## 2015-09-05 ENCOUNTER — Ambulatory Visit (INDEPENDENT_AMBULATORY_CARE_PROVIDER_SITE_OTHER): Payer: BLUE CROSS/BLUE SHIELD | Admitting: Family

## 2015-09-05 VITALS — BP 112/82 | HR 89 | Temp 98.2°F | Ht 66.0 in | Wt 254.0 lb

## 2015-09-05 DIAGNOSIS — E11319 Type 2 diabetes mellitus with unspecified diabetic retinopathy without macular edema: Secondary | ICD-10-CM | POA: Diagnosis not present

## 2015-09-05 LAB — POCT GLYCOSYLATED HEMOGLOBIN (HGB A1C): HEMOGLOBIN A1C: 6.1

## 2015-09-05 NOTE — Patient Instructions (Signed)
Thank you for choosing [facility].  Summary/Instructions:  Please continue to take the Invokana as prescribed.   Your prescription(s) have been submitted to your pharmacy or been printed and provided for you. Please take as directed and contact our office if you believe you are having problem(s) with the medication(s) or have any questions.  If your symptoms worsen or fail to improve, please contact our office for further instruction, or in case of emergency go directly to the emergency room at the closest medical facility.

## 2015-09-05 NOTE — Assessment & Plan Note (Addendum)
Type 2 diabetes with improved control with Invokana and no adverse side effects. Recommend continuing probiotic to prevent mycotic infection. A1c today is 6.1. Diabetic foot exam completed today. All other diabetic prevention up-to-date. Not currently maintained on Ace/ARB or statin. Continue current dosage of Invokana. Follow-up in 6 months or sooner if needed.

## 2015-09-05 NOTE — Progress Notes (Signed)
Pre visit review using our clinic review tool, if applicable. No additional management support is needed unless otherwise documented below in the visit note. 

## 2015-09-05 NOTE — Progress Notes (Signed)
Subjective:    Patient ID: Carrie Neal, female    DOB: December 24, 1970, 45 y.o.   MRN: [id number]  Chief Complaint  Patient presents with  . Diabetes    F/U    HPI:  Carrie Neal is a 45 y.o. female who  has a past medical history of Dysfunctional uterine bleeding (12/2013); Anemia; Diabetes mellitus without complication ([place]); Asthma; Chicken pox; and Kidney stones. and presents today for a follow up office visit.   1.) Type 2 diabetes - This is a chronic problem. Currently maintained on Invega,. Reports taken medications prescribed and denies adverse side effects. Blood sugars have been ranging around 111 and has also notes that she has lost 10 pounds since her last visit. Denies numbness and tingling or changes in vision.   Lab Results  Component Value Date   HGBA1C 6.1 09/05/2015    Allergies  Allergen Reactions  . Codeine Itching     Current Outpatient Prescriptions on File Prior to Visit  Medication Sig Dispense Refill  . blood glucose meter kit and supplies KIT Dispense based on patient and insurance preference. Use up to four times daily as directed. (FOR ICD-9 250.00, 250.01). 1 each 0  . Cyanocobalamin (B-12 PO) Take 1 tablet by mouth daily.     . fluconazole (DIFLUCAN) 150 MG tablet Take 1 tablet (150 mg total) by mouth once. May repeat in 48 hours. 2 tablet 0  . gabapentin (NEURONTIN) 300 MG capsule Take 1 capsule by mouth for 1 day then increase to 1 capsule by mouth twice daily x 1 day then increase to 1 capsule by mouth 3x times daily 90 capsule 0  . HYDROcodone-acetaminophen (NORCO/VICODIN) 5-325 MG tablet Take 1-2 tablets by mouth every 6 hours as needed for pain and/or cough. 7 tablet 0  . Insulin Pen Needle 32G X 4 MM MISC 1 packet by Does not apply route as needed. 100 each 2  . INVOKANA 100 MG TABS tablet TAKE 1 TABLET BY MOUTH DAILY BEFORE BREAKFAST 30 tablet 5  . Multiple Vitamin (MULTIVITAMIN WITH MINERALS) TABS tablet Take 1 tablet by mouth daily.    . ONE  TOUCH ULTRA TEST test strip USE AS INSTRUCTED 100 each 1  . ONETOUCH DELICA LANCETS 76H MISC Use as directed to check sugar 4 times daily 100 each 2  . terconazole (TERAZOL 7) 0.4 % vaginal cream Place 1 applicator vaginally at bedtime. 45 g 0  . traMADol (ULTRAM) 50 MG tablet Take 1 tablet (50 mg total) by mouth every 6 (six) hours as needed. 10 tablet 0   No current facility-administered medications on file prior to visit.    Review of Systems  Eyes:       Denies changes in vision  Respiratory: Negative for chest tightness and shortness of breath.   Cardiovascular: Negative for chest pain, palpitations and leg swelling.  Endocrine: Negative for polydipsia, polyphagia and polyuria.  Neurological: Negative for dizziness, weakness and numbness.      Objective:    BP 112/82 mmHg  Pulse 89  Temp(Src) 98.2 F (36.8 C) (Oral)  Ht '5\' 6"'$  (1.676 m)  Wt 254 lb (115.214 kg)  BMI 41.02 kg/m2  SpO2 97%  LMP 07/11/2015 (Approximate) Nursing note and vital signs reviewed.  Physical Exam  Constitutional: She is oriented to person, place, and time. She appears well-developed and well-nourished. No distress.  Cardiovascular: Normal rate, regular rhythm, normal heart sounds and intact distal pulses.   Pulmonary/Chest: Effort normal and breath sounds normal.  Neurological: She is alert and oriented to person, place, and time.  Diabetic Foot Exam - Simple   Simple Foot Form  Diabetic Foot exam was performed with the following findings:  Yes  09/05/2015 10:13 AM  Visual Inspection  No deformities, no ulcerations, no other skin breakdown bilaterally:  Yes  Sensation Testing  Intact to touch and monofilament testing bilaterally:  Yes  Pulse Check  Posterior Tibialis and Dorsalis pulse intact bilaterally:  Yes   Skin: Skin is warm and dry.  Psychiatric: She has a normal mood and affect. Her behavior is normal. Judgment and thought content normal.       Assessment & Plan:   Problem List  Items Addressed This Visit      Endocrine   Type 2 diabetes, controlled, with retinopathy ([place]) - Primary    Type 2 diabetes with improved control with Invokana and no adverse side effects. Recommend continuing probiotic to prevent mycotic infection. A1c today is 6.1. Diabetic foot exam completed today. All other diabetic prevention up-to-date. Not currently maintained on Ace/ARB or statin. Continue current dosage of Invokana. Follow-up in 6 months or sooner if needed.      Relevant Orders   POCT glycosylated hemoglobin (Hb A1C) (Completed)       I am having Ms. Concha Pyo maintain her multivitamin with minerals, Cyanocobalamin (B-12 PO), Insulin Pen Needle, blood glucose meter kit and supplies, HYDROcodone-acetaminophen, ONE TOUCH ULTRA TEST, ONETOUCH DELICA LANCETS 58P, INVOKANA, fluconazole, gabapentin, traMADol, terconazole, and Levonorgestrel-Ethinyl Estradiol.   Meds ordered this encounter  Medications  . Levonorgestrel-Ethinyl Estradiol (AMETHIA,CAMRESE) 0.15-0.03 &0.01 MG tablet    Sig: Take 1 by mouth daily    Refill:  3     Follow-up: Return in about 6 months (around 03/07/2016).  Mauricio Po, FNP

## 2015-09-23 ENCOUNTER — Inpatient Hospital Stay (HOSPITAL_COMMUNITY)
Admission: AD | Admit: 2015-09-23 | Discharge: 2015-09-23 | Disposition: A | Discharge status: Home / Self Care | Payer: BLUE CROSS/BLUE SHIELD | Source: Ambulatory Visit | Attending: Obstetrics & Gynecology | Admitting: Obstetrics & Gynecology

## 2015-09-23 ENCOUNTER — Encounter (HOSPITAL_COMMUNITY): Payer: Self-pay

## 2015-09-23 DIAGNOSIS — N939 Abnormal uterine and vaginal bleeding, unspecified: Secondary | ICD-10-CM | POA: Insufficient documentation

## 2015-09-23 DIAGNOSIS — Z3202 Encounter for pregnancy test, result negative: Secondary | ICD-10-CM | POA: Insufficient documentation

## 2015-09-23 DIAGNOSIS — D649 Anemia, unspecified: Secondary | ICD-10-CM | POA: Diagnosis not present

## 2015-09-23 LAB — URINE MICROSCOPIC-ADD ON: WBC UA: NONE SEEN WBC/hpf (ref 0–5)

## 2015-09-23 LAB — URINALYSIS, ROUTINE W REFLEX MICROSCOPIC
Bilirubin Urine: NEGATIVE
Ketones, ur: NEGATIVE mg/dL
LEUKOCYTES UA: NEGATIVE
Nitrite: NEGATIVE
PH: 5.5 (ref 5.0–8.0)
PROTEIN: NEGATIVE mg/dL
SPECIFIC GRAVITY, URINE: 1.02 (ref 1.005–1.030)

## 2015-09-23 LAB — CBC
HEMATOCRIT: 29.7 % — AB (ref 36.0–46.0)
HEMOGLOBIN: 8.9 g/dL — AB (ref 12.0–15.0)
MCH: 20.3 pg — AB (ref 26.0–34.0)
MCHC: 30 g/dL (ref 30.0–36.0)
MCV: 67.8 fL — AB (ref 78.0–100.0)
Platelets: 430 10*3/uL — ABNORMAL HIGH (ref 150–400)
RBC: 4.38 MIL/uL (ref 3.87–5.11)
RDW: 18.8 % — ABNORMAL HIGH (ref 11.5–15.5)
WBC: 4.5 10*3/uL (ref 4.0–10.5)

## 2015-09-23 LAB — POCT PREGNANCY, URINE: Preg Test, Ur: NEGATIVE

## 2015-09-23 MED ORDER — KETOROLAC TROMETHAMINE 30 MG/ML IJ SOLN
30.0000 mg | Freq: Once | INTRAMUSCULAR | Status: AC
  Administered 2015-09-23: 30 mg via INTRAVENOUS
  Filled 2015-09-23: qty 1

## 2015-09-23 MED ORDER — SODIUM CHLORIDE 0.9 % IV SOLN
510.0000 mg | Freq: Once | INTRAVENOUS | Status: AC
  Administered 2015-09-23: 510 mg via INTRAVENOUS
  Filled 2015-09-23: qty 17

## 2015-09-23 MED ORDER — LEVONORGEST-ETH ESTRAD 91-DAY 0.15-0.03 &0.01 MG PO TABS: 1.0000 | ORAL_TABLET | Freq: Every day | ORAL | 3 refills | Status: DC

## 2015-09-23 MED ORDER — IBUPROFEN 800 MG PO TABS: 800.0000 mg | ORAL_TABLET | Freq: Three times a day (TID) | ORAL | 0 refills | Status: DC

## 2015-09-23 NOTE — Discharge Instructions (Signed)
Abnormal Uterine Bleeding Abnormal uterine bleeding means bleeding from the vagina that is not your normal menstrual period. This can be:  Bleeding or spotting between periods.  Bleeding after sex (sexual intercourse).  Bleeding that is heavier or more than normal.  Periods that last longer than usual.  Bleeding after menopause. There are many problems that may cause this. Treatment will depend on the cause of the bleeding. Any kind of bleeding that is not normal should be reviewed by your doctor.  HOME CARE Watch your condition for any changes. These actions may lessen any discomfort you are having:  Do not use tampons or douches as told by your doctor.  Change your pads often. You should get regular pelvic exams and Pap tests. Keep all appointments for tests as told by your doctor. GET HELP IF:  You are bleeding for more than 1 week.  You feel dizzy at times. GET HELP RIGHT AWAY IF:   You pass out.  You have to change pads every 15 to 30 minutes.  You have belly pain.  You have a fever.  You become sweaty or weak.  You are passing large blood clots from the vagina.  You feel sick to your stomach (nauseous) and throw up (vomit). MAKE SURE YOU:  Understand these instructions.  Will watch your condition.  Will get help right away if you are not doing well or get worse.   This information is not intended to replace advice given to you by your health care provider. Make sure you discuss any questions you have with your health care provider.   Document Released: 11/30/2008 Document Revised: 02/07/2013 Document Reviewed: 09/01/2012 Elsevier Interactive Patient Education 2016 [employer].  Anemia, Nonspecific Anemia is a condition in which the concentration of red blood cells or hemoglobin in the blood is below normal. Hemoglobin is a substance in red blood cells that carries oxygen to the tissues of the body. Anemia results in not enough oxygen reaching these  tissues.  CAUSES  Common causes of anemia include:   Excessive bleeding. Bleeding may be internal or external. This includes excessive bleeding from periods (in women) or from the intestine.   Poor nutrition.   Chronic kidney, thyroid, and liver disease.  Bone marrow disorders that decrease red blood cell production.  Cancer and treatments for cancer.  HIV, AIDS, and their treatments.  Spleen problems that increase red blood cell destruction.  Blood disorders.  Excess destruction of red blood cells due to infection, medicines, and autoimmune disorders. SIGNS AND SYMPTOMS   Minor weakness.   Dizziness.   Headache.  Palpitations.   Shortness of breath, especially with exercise.   Paleness.  Cold sensitivity.  Indigestion.  Nausea.  Difficulty sleeping.  Difficulty concentrating. Symptoms may occur suddenly or they may develop slowly.  DIAGNOSIS  Additional blood tests are often needed. These help your health care provider determine the best treatment. Your health care provider will check your stool for blood and look for other causes of blood loss.  TREATMENT  Treatment varies depending on the cause of the anemia. Treatment can include:   Supplements of iron, vitamin W80, or folic acid.   Hormone medicines.   A blood transfusion. This may be needed if blood loss is severe.   Hospitalization. This may be needed if there is significant continual blood loss.   Dietary changes.  Spleen removal. HOME CARE INSTRUCTIONS Keep all follow-up appointments. It often takes many weeks to correct anemia, and having your health care  provider check on your condition and your response to treatment is very important. SEEK IMMEDIATE MEDICAL CARE IF:   You develop extreme weakness, shortness of breath, or chest pain.   You become dizzy or have trouble concentrating.  You develop heavy vaginal bleeding.   You develop a rash.   You have bloody or black,  tarry stools.   You faint.   You vomit up blood.   You vomit repeatedly.   You have abdominal pain.  You have a fever or persistent symptoms for more than 2-3 days.   You have a fever and your symptoms suddenly get worse.   You are dehydrated.  MAKE SURE YOU:  Understand these instructions.  Will watch your condition.  Will get help right away if you are not doing well or get worse.   This information is not intended to replace advice given to you by your health care provider. Make sure you discuss any questions you have with your health care provider.   Document Released: 03/12/2004 Document Revised: 10/05/2012 Document Reviewed: 07/29/2012 Elsevier Interactive Patient Education [employer].

## 2015-09-23 NOTE — MAU Provider Note (Signed)
History     CSN: [id number]  Arrival date and time: 09/23/15 1108   First Provider Initiated Contact with Patient 09/23/15 1215      Chief Complaint  Patient presents with  . Vaginal Bleeding   HPI   Ms.Carrie Neal is a 45 y.o. non- pregnant female, here in the MAU for abnormal vaginal bleeding. The bleeding started 3 weeks ago. She has a history of abnormal uterine bleeding; she has had blood transfusions in the past due to severe anemia.  She attests to saturating 2 pads every 4 hours when it initially started " I feel like it is going to stop now".   She attests to Increased episodes of dizziness and occasional tachycardia with exacerbation only.  She had a mirena IUD removed one month ago; this caused her to have longer, heavier periods. She had the mirena for one year, and then had it removed.   She is currently on birth control pills and has been taking them for 1 month.   OB History    Gravida Para Term Preterm AB Living   '6 6 6   '$ 0 6   SAB TAB Ectopic Multiple Live Births   0              Past Medical History:  Diagnosis Date  . Anemia   . Asthma   . Chicken pox   . Diabetes mellitus without complication ([place])   . Dysfunctional uterine bleeding 12/2013  . Kidney stones     Past Surgical History:  Procedure Laterality Date  . CHOLECYSTECTOMY    . DILATATION & CURETTAGE/HYSTEROSCOPY WITH MYOSURE N/A 03/30/2014   Procedure: DILATATION & CURETTAGE/HYSTEROSCOPY WITH MYOSURE and placement of Mirena Intrauterine device;  Surgeon: Alinda Dooms, MD;  Location: [facility];  Service: Gynecology;  Laterality: N/A;  . TUBAL LIGATION    . UMBILICAL HERNIA REPAIR     as child    Family History  Problem Relation Age of Onset  . Diabetes Mother   . Diabetes Cousin   . Diabetes Maternal Grandmother     Social History  Substance Use Topics  . Smoking status: Never Smoker  . Smokeless tobacco: Never Used  . Alcohol use No    Allergies:  Allergies  Allergen  Reactions  . Codeine Itching    Prescriptions Prior to Admission  Medication Sig Dispense Refill Last Dose  . gabapentin (NEURONTIN) 300 MG capsule Take 1 capsule by mouth for 1 day then increase to 1 capsule by mouth twice daily x 1 day then increase to 1 capsule by mouth 3x times daily (Patient taking differently: Take 300 mg by mouth 2 (two) times daily. Take 1 capsule by mouth for 1 day then increase to 1 capsule by mouth twice daily x 1 day then increase to 1 capsule by mouth 3x times daily) 90 capsule 0 Past Week at Unknown time  . ibuprofen (ADVIL,MOTRIN) 800 MG tablet Take 800 mg by mouth every 8 (eight) hours as needed.   09/22/2015 at Unknown time  . INVOKANA 100 MG TABS tablet TAKE 1 TABLET BY MOUTH DAILY BEFORE BREAKFAST 30 tablet 5 09/23/2015 at Unknown time  . Levonorgestrel-Ethinyl Estradiol (AMETHIA,CAMRESE) 0.15-0.03 &0.01 MG tablet Take 1 by mouth daily  3 09/22/2015 at Unknown time  . blood glucose meter kit and supplies KIT Dispense based on patient and insurance preference. Use up to four times daily as directed. (FOR ICD-9 250.00, 250.01). 1 each 0 Taking  . HYDROcodone-acetaminophen (NORCO/VICODIN) 5-325 MG  tablet Take 1-2 tablets by mouth every 6 hours as needed for pain and/or cough. (Patient not taking: Reported on 09/23/2015) 7 tablet 0 Not Taking at Unknown time  . Insulin Pen Needle 32G X 4 MM MISC 1 packet by Does not apply route as needed. 100 each 2 Taking  . ONE TOUCH ULTRA TEST test strip USE AS INSTRUCTED 100 each 1 Taking  . ONETOUCH DELICA LANCETS 33G MISC Use as directed to check sugar 4 times daily 100 each 2 Taking  . terconazole (TERAZOL 7) 0.4 % vaginal cream Place 1 applicator vaginally at bedtime. 45 g 0 Taking  . traMADol (ULTRAM) 50 MG tablet Take 1 tablet (50 mg total) by mouth every 6 (six) hours as needed. 10 tablet 0 Taking   Results for orders placed or performed during the hospital encounter of 09/23/15 (from the past 72 hour(s))  Urinalysis, Routine w  reflex microscopic (not at [facility])     Status: Abnormal   Collection Time: 09/23/15 11:20 AM  Result Value Ref Range   Color, Urine YELLOW YELLOW   APPearance CLEAR CLEAR   Specific Gravity, Urine 1.020 1.005 - 1.030   pH 5.5 5.0 - 8.0   Glucose, UA >1000 (A) NEGATIVE mg/dL   Hgb urine dipstick LARGE (A) NEGATIVE   Bilirubin Urine NEGATIVE NEGATIVE   Ketones, ur NEGATIVE NEGATIVE mg/dL   Protein, ur NEGATIVE NEGATIVE mg/dL   Nitrite NEGATIVE NEGATIVE   Leukocytes, UA NEGATIVE NEGATIVE  Urine microscopic-add on     Status: Abnormal   Collection Time: 09/23/15 11:20 AM  Result Value Ref Range   Squamous Epithelial / LPF 0-5 (A) NONE SEEN   WBC, UA NONE SEEN 0 - 5 WBC/hpf   RBC / HPF TOO NUMEROUS TO COUNT 0 - 5 RBC/hpf   Bacteria, UA RARE (A) NONE SEEN  Pregnancy, urine POC     Status: None   Collection Time: 09/23/15 11:32 AM  Result Value Ref Range   Preg Test, Ur NEGATIVE NEGATIVE    Comment:        THE SENSITIVITY OF THIS METHODOLOGY IS >24 mIU/mL   CBC     Status: Abnormal   Collection Time: 09/23/15 12:03 PM  Result Value Ref Range   WBC 4.5 4.0 - 10.5 K/uL   RBC 4.38 3.87 - 5.11 MIL/uL   Hemoglobin 8.9 (L) 12.0 - 15.0 g/dL   HCT 52.7 (L) 07.0 - 31.7 %   MCV 67.8 (L) 78.0 - 100.0 fL   MCH 20.3 (L) 26.0 - 34.0 pg   MCHC 30.0 30.0 - 36.0 g/dL   RDW 81.8 (H) 94.8 - 53.9 %   Platelets 430 (H) 150 - 400 K/uL    Review of Systems  Constitutional: Positive for malaise/fatigue.  Neurological: Positive for dizziness and weakness.   Physical Exam   Blood pressure 115/78, pulse 99, temperature 98.2 F (36.8 C), temperature source Oral, resp. rate 17, height 5\' 6"  (1.676 m), weight 250 lb (113.4 kg), last menstrual period 09/02/2015.  Physical Exam  Constitutional: She is oriented to person, place, and time. She appears well-developed and well-nourished. No distress.  HENT:  Head: Normocephalic.  Genitourinary:  Genitourinary Comments: Speculum exam: Vagina -  Small-Moderate amount of dark red blood pooling in the vagina. Cervix - Small amount of bleeding from the os, with small, pea size blood clots.  Bimanual exam: Cervix closed Uterus non tender, normal size Adnexa non tender, no masses bilaterally Chaperone present for exam.  Musculoskeletal: Normal range  of motion.  Neurological: She is alert and oriented to person, place, and time.  Skin: Skin is warm. She is not diaphoretic.  Psychiatric: Her behavior is normal.    MAU Course  Procedures  None  MDM  Glucose >1000 mg patient currently taking Invokana.  CBC 510 IV feraheme given IV with LR bolus Orthostatic vitals normal Discussed patient with Dr. Alesia Richards, Dr. Alesia Richards to MAU to evaluate the patient.  Patient feeling much better following IV iron, patient is without dizziness.   Assessment and Plan   A:  1. Abnormal vaginal bleeding   2. Anemia, unspecified anemia type    P:  Dr. Alesia Richards to MAU to see the patient. See her plan of care for Mathews, NP 09/25/2015 1:44 PM  Addendum:   I saw and examined patient at the MAU and agree with the above findings, assessment and plan.  Patient with a history of abnormal bleeding s/p D & C hysteroscopy and polypectomy, had Mirena IUD that decreased her bleeding but patient wanted it removed as she states her bleeding, though light would continue for up to two weeks at a time.  After removal of IUD she desired ocps start, but then she restarted with heavy bleeding since ocps start.    Speculum exam: about 10cc dark red clotted blood in vagina, no active bleeding from cervix.   Bimanual exam: Small anteverted uterus, no adnexal masses. Exam limited by body habitus.    I advised patient to stop the continuous OCps for 4 days then may resume where she left, 1 pill a day.  Pain and bleeding precations reviewed.  She was advised to follow up in hospital for further management as patient was now contemplating having an endometrial  ablation done.   Dr. Alesia Richards.

## 2015-10-17 ENCOUNTER — Other Ambulatory Visit: Payer: Self-pay

## 2015-10-25 ENCOUNTER — Telehealth: Payer: Self-pay | Admitting: Family

## 2015-10-25 NOTE — Telephone Encounter (Signed)
Pt stating she is on INVOKANA 100 MG TABS tablet, she was wondering if Marya Amsler can give her something else to take because she has too many yeast infection that reoccurring. Please advise.

## 2015-10-28 MED ORDER — GLIPIZIDE ER 5 MG PO TB24: 5.0000 mg | ORAL_TABLET | Freq: Every day | ORAL | 0 refills | Status: DC

## 2015-10-28 NOTE — Telephone Encounter (Signed)
Please have her stop the Invokana and start taking glipizide.

## 2015-10-29 MED ORDER — DULAGLUTIDE 0.75 MG/0.5ML ~~LOC~~ SOAJ: 0.7500 mg | SUBCUTANEOUS | 2 refills | Status: DC

## 2015-10-29 NOTE — Telephone Encounter (Signed)
Trulicity sent to pharmacy.

## 2015-10-29 NOTE — Telephone Encounter (Signed)
Pt states that glipizide gave her trouble in the past. Made her gain weight and made sugar drop fast. She said you had mentioned an insulin that you can take once a week. She wants to try that if possible. Please advise

## 2015-10-29 NOTE — Addendum Note (Signed)
Addended by: Mauricio Po D on: 10/29/2015 10:18 PM   Modules accepted: Orders

## 2015-10-30 ENCOUNTER — Telehealth: Payer: Self-pay

## 2015-10-30 NOTE — Telephone Encounter (Signed)
Pt aware.

## 2015-10-30 NOTE — Telephone Encounter (Signed)
Trulicity PA APPROVED [id number] through 02/15/2038

## 2015-10-30 NOTE — Telephone Encounter (Signed)
PA initiated via CoverMyMeds key [id number]

## 2015-11-01 ENCOUNTER — Telehealth: Payer: Self-pay | Admitting: Family

## 2015-11-01 NOTE — Telephone Encounter (Signed)
Surgical clearance appt set up for 9/21

## 2015-11-01 NOTE — Telephone Encounter (Signed)
Adrianne called from [place] request to speak to the assistant concern about medical clearance that they fax on 10/22/15. Please give a call back

## 2015-11-01 NOTE — Telephone Encounter (Signed)
Can you please write a note for pt making a statement as to whether doing a total vaginal hysterectomy does/does not contradict with the pts health. They are needing this ASAP

## 2015-11-04 ENCOUNTER — Other Ambulatory Visit: Payer: Self-pay | Admitting: Obstetrics & Gynecology

## 2015-11-07 ENCOUNTER — Ambulatory Visit (INDEPENDENT_AMBULATORY_CARE_PROVIDER_SITE_OTHER): Payer: BLUE CROSS/BLUE SHIELD | Admitting: Family

## 2015-11-07 ENCOUNTER — Encounter: Payer: Self-pay | Admitting: Family

## 2015-11-07 ENCOUNTER — Other Ambulatory Visit (INDEPENDENT_AMBULATORY_CARE_PROVIDER_SITE_OTHER): Payer: BLUE CROSS/BLUE SHIELD

## 2015-11-07 ENCOUNTER — Encounter (HOSPITAL_COMMUNITY): Payer: Self-pay

## 2015-11-07 ENCOUNTER — Encounter (HOSPITAL_COMMUNITY)
Admission: RE | Admit: 2015-11-07 | Discharge: 2015-11-07 | Disposition: A | Discharge status: Home / Self Care | Payer: BLUE CROSS/BLUE SHIELD | Source: Ambulatory Visit | Attending: Obstetrics & Gynecology | Admitting: Obstetrics & Gynecology

## 2015-11-07 VITALS — BP 110/80 | HR 86 | Temp 98.1°F | Resp 16 | Ht 66.0 in | Wt 253.0 lb

## 2015-11-07 DIAGNOSIS — Z01818 Encounter for other preprocedural examination: Secondary | ICD-10-CM

## 2015-11-07 DIAGNOSIS — D252 Subserosal leiomyoma of uterus: Secondary | ICD-10-CM | POA: Insufficient documentation

## 2015-11-07 DIAGNOSIS — N938 Other specified abnormal uterine and vaginal bleeding: Secondary | ICD-10-CM

## 2015-11-07 DIAGNOSIS — I10 Essential (primary) hypertension: Secondary | ICD-10-CM | POA: Diagnosis not present

## 2015-11-07 DIAGNOSIS — D251 Intramural leiomyoma of uterus: Secondary | ICD-10-CM | POA: Diagnosis not present

## 2015-11-07 DIAGNOSIS — Z01812 Encounter for preprocedural laboratory examination: Secondary | ICD-10-CM | POA: Diagnosis not present

## 2015-11-07 DIAGNOSIS — Z885 Allergy status to narcotic agent status: Secondary | ICD-10-CM | POA: Insufficient documentation

## 2015-11-07 DIAGNOSIS — Z87891 Personal history of nicotine dependence: Secondary | ICD-10-CM | POA: Diagnosis not present

## 2015-11-07 DIAGNOSIS — D649 Anemia, unspecified: Secondary | ICD-10-CM | POA: Insufficient documentation

## 2015-11-07 DIAGNOSIS — Z87442 Personal history of urinary calculi: Secondary | ICD-10-CM | POA: Diagnosis not present

## 2015-11-07 DIAGNOSIS — Z833 Family history of diabetes mellitus: Secondary | ICD-10-CM | POA: Diagnosis not present

## 2015-11-07 DIAGNOSIS — E11319 Type 2 diabetes mellitus with unspecified diabetic retinopathy without macular edema: Secondary | ICD-10-CM | POA: Diagnosis not present

## 2015-11-07 DIAGNOSIS — E119 Type 2 diabetes mellitus without complications: Secondary | ICD-10-CM | POA: Diagnosis not present

## 2015-11-07 DIAGNOSIS — Z0183 Encounter for blood typing: Secondary | ICD-10-CM | POA: Diagnosis not present

## 2015-11-07 DIAGNOSIS — Z23 Encounter for immunization: Secondary | ICD-10-CM | POA: Diagnosis not present

## 2015-11-07 DIAGNOSIS — N920 Excessive and frequent menstruation with regular cycle: Secondary | ICD-10-CM | POA: Diagnosis not present

## 2015-11-07 LAB — COMPREHENSIVE METABOLIC PANEL
ALBUMIN: 4 g/dL (ref 3.5–5.2)
ALK PHOS: 94 U/L (ref 39–117)
ALT: 17 U/L (ref 0–35)
AST: 14 U/L (ref 0–37)
BUN: 9 mg/dL (ref 6–23)
CALCIUM: 10.7 mg/dL — AB (ref 8.4–10.5)
CO2: 25 mEq/L (ref 19–32)
Chloride: 106 mEq/L (ref 96–112)
Creatinine, Ser: 0.75 mg/dL (ref 0.40–1.20)
GFR: 106.27 mL/min (ref >60.00)
Glucose, Bld: 117 mg/dL — ABNORMAL HIGH (ref 70–99)
POTASSIUM: 4 meq/L (ref 3.5–5.1)
Sodium: 136 mEq/L (ref 135–145)
TOTAL PROTEIN: 8 g/dL (ref 6.0–8.3)
Total Bilirubin: 0.4 mg/dL (ref 0.2–1.2)

## 2015-11-07 LAB — PROTIME-INR
INR: 1.1 ratio — AB (ref 0.8–1.0)
Prothrombin Time: 11.4 s (ref 9.6–13.1)

## 2015-11-07 LAB — BASIC METABOLIC PANEL
ANION GAP: 5 (ref 5–15)
BUN: 9 mg/dL (ref 6–20)
CHLORIDE: 108 mmol/L (ref 101–111)
CO2: 22 mmol/L (ref 22–32)
Calcium: 11 mg/dL — ABNORMAL HIGH (ref 8.9–10.3)
Creatinine, Ser: 0.68 mg/dL (ref 0.44–1.00)
GFR calc non Af Amer: >60 mL/min (ref >60)
GLUCOSE: 190 mg/dL — AB (ref 65–99)
Potassium: 3.8 mmol/L (ref 3.5–5.1)
Sodium: 135 mmol/L (ref 135–145)

## 2015-11-07 LAB — CBC
HCT: 36.9 % (ref 36.0–46.0)
HEMATOCRIT: 38.1 % (ref 36.0–46.0)
HEMOGLOBIN: 11.8 g/dL — AB (ref 12.0–15.0)
Hemoglobin: 12.4 g/dL (ref 12.0–15.0)
MCH: 23.7 pg — AB (ref 26.0–34.0)
MCHC: 32.4 g/dL (ref 30.0–36.0)
MCHC: 32 g/dL (ref 30.0–36.0)
MCV: 74 fl — AB (ref 78.0–100.0)
MCV: 74.2 fL — AB (ref 78.0–100.0)
Platelets: 329 10*3/uL (ref 150.0–400.0)
Platelets: 308 10*3/uL (ref 150–400)
RBC: 5.15 Mil/uL — AB (ref 3.87–5.11)
RBC: 4.97 MIL/uL (ref 3.87–5.11)
RDW: 26 % — ABNORMAL HIGH (ref 11.5–15.5)
RDW: 22.3 % — ABNORMAL HIGH (ref 11.5–15.5)
WBC: 3.6 10*3/uL — AB (ref 4.0–10.5)
WBC: 3.6 10*3/uL — ABNORMAL LOW (ref 4.0–10.5)

## 2015-11-07 LAB — HEMOGLOBIN A1C: HEMOGLOBIN A1C: 7 % — AB (ref 4.6–6.5)

## 2015-11-07 NOTE — Assessment & Plan Note (Signed)
Patient was instructed on self-administration of her Trulicity under the guidance of provider. Education completed and patient now feels comfortable administering the medication independently.

## 2015-11-07 NOTE — Assessment & Plan Note (Signed)
Presents today for clearance for a vaginal hysterectomy secondary to dysfunctional uterine bleeding. History and physical exam completed with lab work including INR/PT, A1c, complete metabolic profile, and CBC. Pending blood work results patient is medically cleared for the surgical procedure which will improve her quality of life and eliminate the dysfunctional uterine bleeding and will most likely results in the improvement of her iron deficiency anemia.

## 2015-11-07 NOTE — Patient Instructions (Signed)
Thank you for choosing  HealthCare.  SUMMARY AND INSTRUCTIONS:  Medication:  Please continue to take your medications as prescribed.   Your prescription(s) have been submitted to your pharmacy or been printed and provided for you. Please take as directed and contact our office if you believe you are having problem(s) with the medication(s) or have any questions.  Labs:  Please stop by the lab on the lower level of the building for your blood work. Your results will be released to MyChart (or called to you) after review, usually within 72 hours after test completion. If any changes need to be made, you will be notified at that same time.  1.) The lab is open from 7:30am to 5:30 pm Monday-Friday 2.) No appointment is necessary 3.) Fasting (if needed) is 6-8 hours after food and drink; black coffee and water are okay   Follow up:  If your symptoms worsen or fail to improve, please contact our office for further instruction, or in case of emergency go directly to the emergency room at the closest medical facility.     

## 2015-11-07 NOTE — Patient Instructions (Signed)
Your procedure is scheduled on:11/18/15  Enter through the Main Entrance at :10;45 am Pick up desk phone and dial [phone] and inform us of your arrival.  Please call [phone] if you have any problems the morning of surgery.  Remember: Do not eat food after midnight:Sunday Clear liquids are ok until: 8am on Monday   You may brush your teeth the morning of surgery.  Take these meds the morning of surgery with a sip of water:none  DO NOT wear jewelry, eye make-up, lipstick,body lotion, or dark fingernail polish.  (Polished toes are ok) You may wear deodorant.  If you are to be admitted after surgery, leave suitcase in car until your room has been assigned. Patients discharged on the day of surgery will not be allowed to drive home. Wear loose fitting, comfortable clothes for your ride home.

## 2015-11-07 NOTE — Progress Notes (Signed)
Subjective:    Patient ID: Carrie Neal, female    DOB: [birth year], 45 y.o.   MRN: [id number]  Chief Complaint  Patient presents with  . Pre-op Exam    HPI:  Carrie Neal is a 45 y.o. female who  has a past medical history of Anemia; Asthma; Chicken pox; Diabetes mellitus without complication ([place]); Dysfunctional uterine bleeding (12/2013); and Kidney stones. and presents today for an office visit.   Presents today for evaluation for pre-operative clearance for a total vaginal hysterectomy secondary to continuous dysfunctional uterine bleeding. This results in the associated symptoms of low hemoglobin and iron deficiency anemia. Does not wish to have any additional children.   Allergies  Allergen Reactions  . Codeine Itching      Outpatient Medications Prior to Visit  Medication Sig Dispense Refill  . blood glucose meter kit and supplies KIT Dispense based on patient and insurance preference. Use up to four times daily as directed. (FOR ICD-9 250.00, 250.01). 1 each 0  . Dulaglutide (TRULICITY) 5.85 ID/7.8EU SOPN Inject 0.75 mg into the skin once a week. 4 pen 2  . gabapentin (NEURONTIN) 300 MG capsule Take 1 capsule by mouth for 1 day then increase to 1 capsule by mouth twice daily x 1 day then increase to 1 capsule by mouth 3x times daily (Patient taking differently: Take 300 mg by mouth 2 (two) times daily. Take 1 capsule by mouth for 1 day then increase to 1 capsule by mouth twice daily x 1 day then increase to 1 capsule by mouth 3x times daily) 90 capsule 0  . ibuprofen (ADVIL,MOTRIN) 800 MG tablet Take 1 tablet (800 mg total) by mouth 3 (three) times daily. 21 tablet 0  . Insulin Pen Needle 32G X 4 MM MISC 1 packet by Does not apply route as needed. 100 each 2  . Levonorgestrel-Ethinyl Estradiol (AMETHIA,CAMRESE) 0.15-0.03 &0.01 MG tablet Take 1 tablet by mouth daily. Take 1 by mouth dailyTake 1 by mouth daily, Starting 09/27/15. 1 Package 3  . ONE TOUCH ULTRA TEST test strip USE AS  INSTRUCTED 100 each 1  . ONETOUCH DELICA LANCETS 23N MISC Use as directed to check sugar 4 times daily 100 each 2   No facility-administered medications prior to visit.       Past Surgical History:  Procedure Laterality Date  . CHOLECYSTECTOMY    . DILATATION & CURETTAGE/HYSTEROSCOPY WITH MYOSURE N/A 03/30/2014   Procedure: DILATATION & CURETTAGE/HYSTEROSCOPY WITH MYOSURE and placement of Mirena Intrauterine device;  Surgeon: Alinda Dooms, MD;  Location: [facility];  Service: Gynecology;  Laterality: N/A;  . TUBAL LIGATION    . UMBILICAL HERNIA REPAIR     as child      Past Medical History:  Diagnosis Date  . Anemia   . Asthma   . Chicken pox   . Diabetes mellitus without complication ([place])   . Dysfunctional uterine bleeding 12/2013  . Kidney stones       Review of Systems    Constitutional: Denies fever, chills, fatigue, or significant weight gain/loss. HENT: Head: Denies headache or neck pain Ears: Denies changes in hearing, ringing in ears, earache, drainage Nose: Denies discharge, stuffiness, itching, nosebleed, sinus pain Throat: Denies sore throat, hoarseness, dry mouth, sores, thrush Eyes: Denies loss/changes in vision, pain, redness, blurry/double vision, flashing lights Cardiovascular: Denies chest pain/discomfort, tightness, palpitations, shortness of breath with activity, difficulty lying down, swelling, sudden awakening with shortness of breath Respiratory: Denies shortness of breath, cough, sputum production, wheezing  Gastrointestinal: Denies dysphasia, heartburn, change in appetite, nausea, change in bowel habits, rectal bleeding, constipation, diarrhea, yellow skin or eyes Genitourinary: Denies frequency, urgency, burning/pain, blood in urine, incontinence, change in urinary strength. Positive for dysfunctional uterine bleeding Musculoskeletal: Denies muscle/joint pain, stiffness, back pain, redness or swelling of joints, trauma Skin: Denies rashes, lumps,  itching, dryness, color changes, or hair/nail changes Neurological: Denies dizziness, fainting, seizures, weakness, numbness, tingling, tremor Psychiatric - Denies nervousness, stress, depression or memory loss Endocrine: Denies heat or cold intolerance, sweating, frequent urination, excessive thirst, changes in appetite Hematologic: Denies ease of bruising or bleeding  Objective:    BP 110/80 (BP Location: Left Arm, Patient Position: Sitting, Cuff Size: Large)   Pulse 86   Temp 98.1 F (36.7 C) (Oral)   Resp 16   Ht '5\' 6"'$  (1.676 m)   Wt 253 lb (114.8 kg)   SpO2 97%   BMI 40.84 kg/m  Nursing note and vital signs reviewed.  Physical Exam  Constitutional: She is oriented to person, place, and time. She appears well-developed and well-nourished.  HENT:  Head: Normocephalic.  Right Ear: Hearing, tympanic membrane, external ear and ear canal normal.  Left Ear: Hearing, tympanic membrane, external ear and ear canal normal.  Nose: Nose normal.  Mouth/Throat: Uvula is midline, oropharynx is clear and moist and mucous membranes are normal.  Eyes: Conjunctivae and EOM are normal. Pupils are equal, round, and reactive to light.  Neck: Neck supple. No JVD present. No tracheal deviation present. No thyromegaly present.  Cardiovascular: Normal rate, regular rhythm, normal heart sounds and intact distal pulses.   Pulmonary/Chest: Effort normal and breath sounds normal.  Abdominal: Soft. Bowel sounds are normal. She exhibits no distension and no mass. There is no tenderness. There is no rebound and no guarding.  Musculoskeletal: Normal range of motion. She exhibits no edema or tenderness.  Lymphadenopathy:    She has no cervical adenopathy.  Neurological: She is alert and oriented to person, place, and time. She has normal reflexes. No cranial nerve deficit. She exhibits normal muscle tone. Coordination normal.  Skin: Skin is warm and dry.  Psychiatric: She has a normal mood and affect. Her  behavior is normal. Judgment and thought content normal.       Assessment & Plan:   Problem List Items Addressed This Visit      Endocrine   Type 2 diabetes, controlled, with retinopathy ([place])    Patient was instructed on self-administration of her Trulicity under the guidance of provider. Education completed and patient now feels comfortable administering the medication independently.         Other   DUB (dysfunctional uterine bleeding)   Anemia   Pre-operative clearance - Primary    Presents today for clearance for a vaginal hysterectomy secondary to dysfunctional uterine bleeding. History and physical exam completed with lab work including INR/PT, A1c, complete metabolic profile, and CBC. Pending blood work results patient is medically cleared for the surgical procedure which will improve her quality of life and eliminate the dysfunctional uterine bleeding and will most likely results in the improvement of her iron deficiency anemia.       Other Visit Diagnoses   None.    I am having Ms. Carrie Neal maintain her Insulin Pen Needle, blood glucose meter kit and supplies, ONE TOUCH ULTRA TEST, ONETOUCH DELICA LANCETS 65Y, gabapentin, Levonorgestrel-Ethinyl Estradiol, ibuprofen, and Dulaglutide.   Follow-up: Return if symptoms worsen or fail to improve.  Mauricio Po, FNP

## 2015-11-07 NOTE — Pre-Procedure Instructions (Signed)
Preop lab results routed to Dr. Alesia Richards

## 2015-11-08 ENCOUNTER — Encounter: Payer: Self-pay | Admitting: Family

## 2015-11-14 ENCOUNTER — Other Ambulatory Visit: Payer: Self-pay | Admitting: Obstetrics & Gynecology

## 2015-11-15 NOTE — H&P (Signed)
Carrie Neal is an 45 y.o. female P6 with a long history of menorrhagia, abnormal uterine bleeding and fibroids, who has failed conservative medical (OCPs, Mirena IUD) and surgical treatment (D & C hysteroscopy and polypectomy), now desiring definitive treatment via vaginal hysterectomy.  She also desires to decrease her future risk of fallopian tube and ovarian cancer and desires bilateral salpingectomy. Patient has received medical clearance from her PCP to undergo the procedure- see separate note.   Pertinent Gynecological History: Menses: flow is excessive with use of 12 pads or tampons on heaviest days and with severe dysmenorrhea Bleeding: Abnormal uterine bleeding Contraception: tubal ligation DES exposure: unknown Blood transfusions: 2016, once.  Sexually transmitted diseases: no past history Previous GYN Procedures: D & C Hysteroscopy  Last mammogram: normal Last pap: normal Date: 03/08/2014 OB History: G6, P6   Menstrual History:  No LMP recorded (lmp unknown).    Past Medical History:  Diagnosis Date  . Anemia   . Asthma    no inhaler  . Chicken pox   . Diabetes mellitus without complication (HCC)    Type 2  . Dysfunctional uterine bleeding 12/2013  . Hypertension    "years ago was on HTN med"  . Kidney stones     Past Surgical History:  Procedure Laterality Date  . CHOLECYSTECTOMY    . DILATATION & CURETTAGE/HYSTEROSCOPY WITH MYOSURE N/A 03/30/2014   Procedure: DILATATION & CURETTAGE/HYSTEROSCOPY WITH MYOSURE and placement of Mirena Intrauterine device;  Surgeon: Alinda Dooms, MD;  Location: [facility];  Service: Gynecology;  Laterality: N/A;  . HERNIA REPAIR    . TUBAL LIGATION    . UMBILICAL HERNIA REPAIR     as child    Family History  Problem Relation Age of Onset  . Diabetes Mother   . Diabetes Cousin   . Diabetes Maternal Grandmother     Social History:  reports that she has quit smoking. Her smoking use included Cigarettes. She quit after 4.00 years  of use. She has never used smokeless tobacco. She reports that she does not drink alcohol or use drugs.  Allergies:  Allergies  Allergen Reactions  . Codeine Itching    No prescriptions prior to admission.    ROS See below  There were no vitals taken for this visit. Physical Exam  Constitutional: She is oriented to person, place, and time. She appears well-developed and well-nourished.  HENT:  Head: Normocephalic and atraumatic.  Neck: Normal range of motion.  Cardiovascular: Normal rate, regular rhythm and normal heart sounds.   Respiratory: Effort normal and breath sounds normal.  GI: Soft. Bowel sounds are normal.  Genitourinary: Uterus 12 week sized, good descensus with valsalva, no adnexal masses.   Neurological: She is alert and oriented to person, place, and time.  Skin: Skin is warm and dry.  Psychiatric: She has a normal mood and affect. Her behavior is normal.   ROS  Constitutional: Denies fevers/chills Cardiovascular: Denies chest pain or palpitations Pulmonary: Denies coughing or wheezing Gastrointestinal: Denies nausea, vomiting or diarrhea Genitourinary: Denies pelvic pain, unusual vaginal bleeding, unusual vaginal discharge, dysuria, urgency or frequency. With hx of heavy irregular vaginal bleeding. Musculoskeletal: Denies muscle or joint aches and pain.  Neurology: Denies abnormal sensations such as tingling or numbness.   Labs 11/07/2015  Blood group: O Pos.  INR 1.1 PT: 11.4     Component Value Date/Time   WBC 3.6 (L) 11/07/2015 1130   RBC 4.97 11/07/2015 1130   HGB 11.8 (L) 11/07/2015 1130  HCT 36.9 11/07/2015 1130   PLT 308 11/07/2015 1130   MCV 74.2 (L) 11/07/2015 1130   MCH 23.7 (L) 11/07/2015 1130   MCHC 32.0 11/07/2015 1130   RDW 22.3 (H) 11/07/2015 1130   LYMPHSABS 1.9 04/13/2014 1634   MONOABS 0.6 04/13/2014 1634   EOSABS 0.1 04/13/2014 1634   BASOSABS 0.0 04/13/2014 1634   CMP     Component Value Date/Time   NA 135 11/07/2015  1130   K 3.8 11/07/2015 1130   CL 108 11/07/2015 1130   CO2 22 11/07/2015 1130   GLUCOSE 190 (H) 11/07/2015 1130   BUN 9 11/07/2015 1130   CREATININE 0.68 11/07/2015 1130   CALCIUM 11.0 (H) 11/07/2015 1130   PROT 8.0 11/07/2015 0910   ALBUMIN 4.0 11/07/2015 0910   AST 14 11/07/2015 0910   ALT 17 11/07/2015 0910   ALKPHOS 94 11/07/2015 0910   BILITOT 0.4 11/07/2015 0910   GFRNONAA >60 11/07/2015 1130   GFRAA >60 11/07/2015 1130   Pelvic Ultrasound 09/27/2015: Uterus 11.8 cm with diffuse adenomyosis, five small intramural, subserosal and fundal fibroids ranging in size 1.8 to 2.5cm.  Questionable (seen on abdominal views but not transvaginal views) 5 cm simple left ovarian cyst.    Endometrial biopsy 10/17/2015: Scant inactive endometrial glands, benign endocervical glands.     Assessment/Plan:  46 y/o P6 with fibroid history, menorrhagia, abnormal uterine bleeding here for vaginal hysterectomy and bilateral salpingectomy  I discussed with patient risks, benefits and alternatives of the procedure including risks of bleeding, infection, damage to organs.  We discussed possible need of blood transfusion and patient is agreeable to it. We discussed possible need of conversion of case to laparoscopic assisted or abdominal hysterectomy incase there is difficulty accessing her organs solely vaginally or excessive bleeding or for other reasons.  All her questions were answered and she was consented for the procedure.      Alinda Dooms, MD.  11/15/2015, 6:57 PM

## 2015-11-17 MED ORDER — DEXTROSE 5 % IV SOLN
2.0000 g | INTRAVENOUS | Status: AC
  Administered 2015-11-18: 2 g via INTRAVENOUS
  Filled 2015-11-17: qty 2

## 2015-11-18 ENCOUNTER — Encounter (HOSPITAL_COMMUNITY)
Admission: RE | Disposition: A | Discharge status: Home / Self Care | Payer: Self-pay | Source: Ambulatory Visit | Attending: Obstetrics & Gynecology

## 2015-11-18 ENCOUNTER — Encounter (HOSPITAL_COMMUNITY): Payer: Self-pay | Admitting: Anesthesiology

## 2015-11-18 ENCOUNTER — Ambulatory Visit (HOSPITAL_COMMUNITY): Payer: BLUE CROSS/BLUE SHIELD | Admitting: Anesthesiology

## 2015-11-18 ENCOUNTER — Observation Stay (HOSPITAL_COMMUNITY)
Admission: RE | Admit: 2015-11-18 | Discharge: 2015-11-19 | Disposition: A | Discharge status: Home / Self Care | Payer: BLUE CROSS/BLUE SHIELD | Source: Ambulatory Visit | Attending: Obstetrics & Gynecology | Admitting: Obstetrics & Gynecology

## 2015-11-18 DIAGNOSIS — E119 Type 2 diabetes mellitus without complications: Secondary | ICD-10-CM | POA: Insufficient documentation

## 2015-11-18 DIAGNOSIS — Z87891 Personal history of nicotine dependence: Secondary | ICD-10-CM | POA: Diagnosis not present

## 2015-11-18 DIAGNOSIS — I10 Essential (primary) hypertension: Secondary | ICD-10-CM | POA: Diagnosis not present

## 2015-11-18 DIAGNOSIS — J45909 Unspecified asthma, uncomplicated: Secondary | ICD-10-CM | POA: Insufficient documentation

## 2015-11-18 DIAGNOSIS — Z90710 Acquired absence of both cervix and uterus: Principal | ICD-10-CM | POA: Diagnosis present

## 2015-11-18 HISTORY — PX: VAGINAL HYSTERECTOMY: SHX2639

## 2015-11-18 HISTORY — PX: CYSTOSCOPY: SHX5120

## 2015-11-18 LAB — CBC
HCT: 29.8 % — ABNORMAL LOW (ref 36.0–46.0)
Hemoglobin: 9.7 g/dL — ABNORMAL LOW (ref 12.0–15.0)
MCH: 24.4 pg — AB (ref 26.0–34.0)
MCHC: 32.6 g/dL (ref 30.0–36.0)
MCV: 75.1 fL — AB (ref 78.0–100.0)
PLATELETS: 252 10*3/uL (ref 150–400)
RBC: 3.97 MIL/uL (ref 3.87–5.11)
RDW: 22.5 % — AB (ref 11.5–15.5)
WBC: 5.3 10*3/uL (ref 4.0–10.5)

## 2015-11-18 LAB — HEMOGLOBIN: HEMOGLOBIN: 9.6 g/dL — AB (ref 12.0–15.0)

## 2015-11-18 LAB — GLUCOSE, CAPILLARY
GLUCOSE-CAPILLARY: 112 mg/dL — AB (ref 65–99)
Glucose-Capillary: 153 mg/dL — ABNORMAL HIGH (ref 65–99)

## 2015-11-18 LAB — PREPARE RBC (CROSSMATCH)

## 2015-11-18 LAB — PREGNANCY, URINE: PREG TEST UR: NEGATIVE

## 2015-11-18 SURGERY — HYSTERECTOMY, VAGINAL
Anesthesia: General | Site: Vagina

## 2015-11-18 MED ORDER — LIDOCAINE HCL (CARDIAC) 20 MG/ML IV SOLN
INTRAVENOUS | Status: AC
  Filled 2015-11-18: qty 5

## 2015-11-18 MED ORDER — ONDANSETRON HCL 4 MG/2ML IJ SOLN
INTRAMUSCULAR | Status: DC | PRN
  Administered 2015-11-18: 4 mg via INTRAVENOUS

## 2015-11-18 MED ORDER — SODIUM CHLORIDE 0.9 % IJ SOLN
INTRAMUSCULAR | Status: AC
  Filled 2015-11-18: qty 10

## 2015-11-18 MED ORDER — GLYCOPYRROLATE 0.2 MG/ML IJ SOLN
INTRAMUSCULAR | Status: AC
  Filled 2015-11-18: qty 1

## 2015-11-18 MED ORDER — SCOPOLAMINE 1 MG/3DAYS TD PT72
1.0000 | MEDICATED_PATCH | Freq: Once | TRANSDERMAL | Status: AC
  Administered 2015-11-18 (×2): 1.5 mg via TRANSDERMAL

## 2015-11-18 MED ORDER — LACTATED RINGERS IV SOLN
INTRAVENOUS | Status: DC
  Administered 2015-11-18: 20:00:00 via INTRAVENOUS

## 2015-11-18 MED ORDER — ONDANSETRON HCL 4 MG/2ML IJ SOLN: 4.0000 mg | Freq: Four times a day (QID) | INTRAMUSCULAR | Status: DC | PRN

## 2015-11-18 MED ORDER — VASOPRESSIN 20 UNIT/ML IV SOLN
INTRAVENOUS | Status: AC
  Filled 2015-11-18: qty 1

## 2015-11-18 MED ORDER — PROPOFOL 10 MG/ML IV BOLUS
INTRAVENOUS | Status: DC | PRN
  Administered 2015-11-18: 200 mg via INTRAVENOUS

## 2015-11-18 MED ORDER — METHYLENE BLUE 0.5 % INJ SOLN
INTRAVENOUS | Status: AC
  Filled 2015-11-18: qty 10

## 2015-11-18 MED ORDER — SODIUM CHLORIDE 0.9 % IJ SOLN
INTRAMUSCULAR | Status: AC
  Filled 2015-11-18: qty 50

## 2015-11-18 MED ORDER — METOCLOPRAMIDE HCL 5 MG/ML IJ SOLN
INTRAMUSCULAR | Status: DC | PRN
  Administered 2015-11-18: 10 mg via INTRAVENOUS

## 2015-11-18 MED ORDER — MIDAZOLAM HCL 2 MG/2ML IJ SOLN
INTRAMUSCULAR | Status: DC | PRN
  Administered 2015-11-18: 2 mg via INTRAVENOUS

## 2015-11-18 MED ORDER — FENTANYL CITRATE (PF) 250 MCG/5ML IJ SOLN
INTRAMUSCULAR | Status: AC
  Filled 2015-11-18: qty 5

## 2015-11-18 MED ORDER — ONDANSETRON HCL 4 MG/2ML IJ SOLN
INTRAMUSCULAR | Status: AC
  Filled 2015-11-18: qty 2

## 2015-11-18 MED ORDER — HYDROMORPHONE HCL 1 MG/ML IJ SOLN
INTRAMUSCULAR | Status: AC
  Filled 2015-11-18: qty 1

## 2015-11-18 MED ORDER — SENNOSIDES-DOCUSATE SODIUM 8.6-50 MG PO TABS
1.0000 | ORAL_TABLET | Freq: Every evening | ORAL | Status: DC | PRN
  Administered 2015-11-19: 1 via ORAL
  Filled 2015-11-18: qty 1

## 2015-11-18 MED ORDER — SCOPOLAMINE 1 MG/3DAYS TD PT72
MEDICATED_PATCH | TRANSDERMAL | Status: AC
  Filled 2015-11-18: qty 1

## 2015-11-18 MED ORDER — INDIGOTINDISULFONATE SODIUM 8 MG/ML IJ SOLN
INTRAMUSCULAR | Status: DC | PRN
  Administered 2015-11-18: 4 mg via INTRAVENOUS

## 2015-11-18 MED ORDER — MIDAZOLAM HCL 2 MG/2ML IJ SOLN
INTRAMUSCULAR | Status: AC
  Filled 2015-11-18: qty 2

## 2015-11-18 MED ORDER — DEXAMETHASONE SODIUM PHOSPHATE 4 MG/ML IJ SOLN
INTRAMUSCULAR | Status: DC | PRN
  Administered 2015-11-18: 4 mg via INTRAVENOUS

## 2015-11-18 MED ORDER — SODIUM CHLORIDE 0.9 % IV SOLN: Freq: Once | INTRAVENOUS | Status: DC

## 2015-11-18 MED ORDER — KETOROLAC TROMETHAMINE 30 MG/ML IJ SOLN: 30.0000 mg | Freq: Once | INTRAMUSCULAR | Status: DC

## 2015-11-18 MED ORDER — NEOSTIGMINE METHYLSULFATE 10 MG/10ML IV SOLN
INTRAVENOUS | Status: DC | PRN
  Administered 2015-11-18: 1 mg via INTRAVENOUS

## 2015-11-18 MED ORDER — OXYCODONE-ACETAMINOPHEN 5-325 MG PO TABS
1.0000 | ORAL_TABLET | ORAL | Status: DC | PRN
  Administered 2015-11-19: 2 via ORAL
  Administered 2015-11-19: 1 via ORAL
  Filled 2015-11-18: qty 1
  Filled 2015-11-18: qty 2

## 2015-11-18 MED ORDER — PROPOFOL 10 MG/ML IV BOLUS
INTRAVENOUS | Status: AC
  Filled 2015-11-18: qty 20

## 2015-11-18 MED ORDER — FENTANYL CITRATE (PF) 250 MCG/5ML IJ SOLN
INTRAMUSCULAR | Status: DC | PRN
  Administered 2015-11-18 (×3): 50 ug via INTRAVENOUS
  Administered 2015-11-18: 100 ug via INTRAVENOUS

## 2015-11-18 MED ORDER — ROCURONIUM BROMIDE 100 MG/10ML IV SOLN
INTRAVENOUS | Status: AC
  Filled 2015-11-18: qty 1

## 2015-11-18 MED ORDER — LIDOCAINE-EPINEPHRINE 1 %-1:100000 IJ SOLN
INTRAMUSCULAR | Status: DC | PRN
  Administered 2015-11-18: 20 mL

## 2015-11-18 MED ORDER — PHENYLEPHRINE HCL 10 MG/ML IJ SOLN
INTRAMUSCULAR | Status: DC | PRN
Start: 2015-11-18 — End: 2015-11-18
  Administered 2015-11-18: 80 ug via INTRAVENOUS
  Administered 2015-11-18 (×2): 40 ug via INTRAVENOUS

## 2015-11-18 MED ORDER — GLYCOPYRROLATE 0.2 MG/ML IJ SOLN
INTRAMUSCULAR | Status: DC | PRN
  Administered 2015-11-18: 0.1 mg via INTRAVENOUS
  Administered 2015-11-18: 0.2 mg via INTRAVENOUS

## 2015-11-18 MED ORDER — KETOROLAC TROMETHAMINE 30 MG/ML IJ SOLN
30.0000 mg | Freq: Three times a day (TID) | INTRAMUSCULAR | Status: DC | PRN
  Administered 2015-11-18: 30 mg via INTRAVENOUS
  Filled 2015-11-18: qty 1

## 2015-11-18 MED ORDER — LIDOCAINE-EPINEPHRINE 1 %-1:100000 IJ SOLN
INTRAMUSCULAR | Status: AC
  Filled 2015-11-18: qty 1

## 2015-11-18 MED ORDER — PNEUMOCOCCAL VAC POLYVALENT 25 MCG/0.5ML IJ INJ
0.5000 mL | INJECTION | INTRAMUSCULAR | Status: AC
  Administered 2015-11-19: 0.5 mL via INTRAMUSCULAR
  Filled 2015-11-18 (×2): qty 0.5

## 2015-11-18 MED ORDER — HYDROMORPHONE HCL 1 MG/ML IJ SOLN
INTRAMUSCULAR | Status: DC | PRN
  Administered 2015-11-18 (×2): 1 mg via INTRAVENOUS

## 2015-11-18 MED ORDER — LACTATED RINGERS IV SOLN
INTRAVENOUS | Status: DC
  Administered 2015-11-18 (×4): via INTRAVENOUS

## 2015-11-18 MED ORDER — LACTATED RINGERS IV SOLN
INTRAVENOUS | Status: DC | PRN
  Administered 2015-11-18: 15:00:00 via INTRAVENOUS

## 2015-11-18 MED ORDER — FENTANYL CITRATE (PF) 100 MCG/2ML IJ SOLN: 25.0000 ug | INTRAMUSCULAR | Status: DC | PRN

## 2015-11-18 MED ORDER — MEPERIDINE HCL 25 MG/ML IJ SOLN: 6.2500 mg | INTRAMUSCULAR | Status: DC | PRN

## 2015-11-18 MED ORDER — PROMETHAZINE HCL 25 MG/ML IJ SOLN: 6.2500 mg | INTRAMUSCULAR | Status: DC | PRN

## 2015-11-18 MED ORDER — NEOSTIGMINE METHYLSULFATE 10 MG/10ML IV SOLN
INTRAVENOUS | Status: AC
  Filled 2015-11-18: qty 1

## 2015-11-18 MED ORDER — 0.9 % SODIUM CHLORIDE (POUR BTL) OPTIME
TOPICAL | Status: DC | PRN
  Administered 2015-11-18: 1000 mL

## 2015-11-18 MED ORDER — SIMETHICONE 80 MG PO CHEW: 80.0000 mg | CHEWABLE_TABLET | Freq: Four times a day (QID) | ORAL | Status: DC | PRN

## 2015-11-18 MED ORDER — LIDOCAINE HCL (CARDIAC) 20 MG/ML IV SOLN
INTRAVENOUS | Status: DC | PRN
  Administered 2015-11-18: 100 mg via INTRAVENOUS

## 2015-11-18 MED ORDER — SCOPOLAMINE 1 MG/3DAYS TD PT72
MEDICATED_PATCH | TRANSDERMAL | Status: AC
  Administered 2015-11-18: 1.5 mg via TRANSDERMAL
  Filled 2015-11-18: qty 1

## 2015-11-18 MED ORDER — ONDANSETRON HCL 4 MG PO TABS
4.0000 mg | ORAL_TABLET | Freq: Four times a day (QID) | ORAL | Status: DC | PRN
Start: 2015-11-18 — End: 2015-11-19

## 2015-11-18 MED ORDER — ROCURONIUM BROMIDE 100 MG/10ML IV SOLN
INTRAVENOUS | Status: DC | PRN
  Administered 2015-11-18 (×2): 10 mg via INTRAVENOUS
  Administered 2015-11-18 (×2): 20 mg via INTRAVENOUS
  Administered 2015-11-18: 50 mg via INTRAVENOUS
  Administered 2015-11-18: 10 mg via INTRAVENOUS

## 2015-11-18 MED ORDER — IBUPROFEN 800 MG PO TABS
800.0000 mg | ORAL_TABLET | Freq: Three times a day (TID) | ORAL | Status: DC | PRN
  Administered 2015-11-19: 800 mg via ORAL
  Filled 2015-11-18: qty 1

## 2015-11-18 SURGICAL SUPPLY — 73 items
BAG SPEC RTRVL LRG 6X4 10 (ENDOMECHANICALS)
CABLE HIGH FREQUENCY MONO STRZ (ELECTRODE) IMPLANT
CANISTER SUCT 3000ML (MISCELLANEOUS) ×4 IMPLANT
CLOTH BEACON ORANGE TIMEOUT ST (SAFETY) ×4 IMPLANT
CONT PATH 16OZ SNAP LID 3702 (MISCELLANEOUS) ×4 IMPLANT
COVER BACK TABLE 60X90IN (DRAPES) ×4 IMPLANT
DECANTER SPIKE VIAL GLASS SM (MISCELLANEOUS) ×4 IMPLANT
DRAPE WARM FLUID 44X44 (DRAPE) IMPLANT
DRSG OPSITE POSTOP 3X4 (GAUZE/BANDAGES/DRESSINGS) IMPLANT
DRSG OPSITE POSTOP 4X10 (GAUZE/BANDAGES/DRESSINGS) ×2 IMPLANT
DURAPREP 26ML APPLICATOR (WOUND CARE) ×4 IMPLANT
ELECT BLADE 6.5 EXT (BLADE) IMPLANT
ELECT LIGASURE LONG (ELECTRODE) ×2 IMPLANT
ELECT REM PT RETURN 9FT ADLT (ELECTROSURGICAL) ×4
ELECTRODE REM PT RTRN 9FT ADLT (ELECTROSURGICAL) ×2 IMPLANT
GAUZE SPONGE 4X4 16PLY XRAY LF (GAUZE/BANDAGES/DRESSINGS) IMPLANT
GLOVE BIOGEL PI IND STRL 7.0 (GLOVE) ×8 IMPLANT
GLOVE BIOGEL PI INDICATOR 7.0 (GLOVE) ×8
GLOVE ECLIPSE 6.5 STRL STRAW (GLOVE) ×12 IMPLANT
GLOVE SURG SS PI 6.5 STRL IVOR (GLOVE) ×4 IMPLANT
GOWN STRL REUS W/TWL LRG LVL3 (GOWN DISPOSABLE) ×16 IMPLANT
LEGGING LITHOTOMY PAIR STRL (DRAPES) ×4 IMPLANT
LIGASURE VESSEL 5MM BLUNT TIP (ELECTROSURGICAL) IMPLANT
LIQUID BAND (GAUZE/BANDAGES/DRESSINGS) IMPLANT
NDL SPNL 22GX3.5 QUINCKE BK (NEEDLE) IMPLANT
NEEDLE INSUFFLATION 120MM (ENDOMECHANICALS) IMPLANT
NEEDLE SPNL 22GX3.5 QUINCKE BK (NEEDLE) IMPLANT
NS IRRIG 1000ML POUR BTL (IV SOLUTION) ×4 IMPLANT
PACK ABDOMINAL GYN (CUSTOM PROCEDURE TRAY) ×2 IMPLANT
PACK LAVH (CUSTOM PROCEDURE TRAY) ×4 IMPLANT
PACK ROBOTIC GOWN (GOWN DISPOSABLE) ×4 IMPLANT
PACK TRENDGUARD 450 HYBRID PRO (MISCELLANEOUS) IMPLANT
PACK TRENDGUARD 600 HYBRD PROC (MISCELLANEOUS) IMPLANT
PACK VAGINAL WOMENS (CUSTOM PROCEDURE TRAY) ×4 IMPLANT
PAD OB MATERNITY 4.3X12.25 (PERSONAL CARE ITEMS) ×4 IMPLANT
PENCIL SMOKE EVAC W/HOLSTER (ELECTROSURGICAL) ×2 IMPLANT
POUCH SPECIMEN RETRIEVAL 10MM (ENDOMECHANICALS) IMPLANT
PROTECTOR NERVE ULNAR (MISCELLANEOUS) ×8 IMPLANT
SET IRRIG TUBING LAPAROSCOPIC (IRRIGATION / IRRIGATOR) IMPLANT
SLEEVE XCEL OPT CAN 5 100 (ENDOMECHANICALS) ×4 IMPLANT
SOLUTION ELECTROLUBE (MISCELLANEOUS) IMPLANT
SPONGE LAP 18X18 X RAY DECT (DISPOSABLE) IMPLANT
SUT MNCRL AB 3-0 PS2 27 (SUTURE) ×2 IMPLANT
SUT MNCRL AB 4-0 PS2 18 (SUTURE) ×2 IMPLANT
SUT MON AB 4-0 PS1 27 (SUTURE) ×2 IMPLANT
SUT PLAIN 2 0 XLH (SUTURE) IMPLANT
SUT PROLENE 0 CT 1 30 (SUTURE) IMPLANT
SUT VIC AB 0 CT1 18XCR BRD8 (SUTURE) ×4 IMPLANT
SUT VIC AB 0 CT1 27 (SUTURE) ×8
SUT VIC AB 0 CT1 27XBRD ANBCTR (SUTURE) ×8 IMPLANT
SUT VIC AB 0 CT1 8-18 (SUTURE) ×4
SUT VIC AB 1 CT1 36 (SUTURE) ×2 IMPLANT
SUT VIC AB 2-0 CT1 (SUTURE) ×8 IMPLANT
SUT VIC AB 2-0 CT1 27 (SUTURE) ×8
SUT VIC AB 2-0 CT1 TAPERPNT 27 (SUTURE) IMPLANT
SUT VIC AB 2-0 SH 27 (SUTURE) ×8
SUT VIC AB 2-0 SH 27XBRD (SUTURE) IMPLANT
SUT VIC AB 3-0 CT1 27 (SUTURE)
SUT VIC AB 3-0 CT1 TAPERPNT 27 (SUTURE) IMPLANT
SUT VICRYL 0 27 CT2 27 ABS (SUTURE) ×6 IMPLANT
SUT VICRYL 0 TIES 12 18 (SUTURE) ×4 IMPLANT
SUT VICRYL 0 UR6 27IN ABS (SUTURE) ×6 IMPLANT
SUT VICRYL 4-0 PS2 18IN ABS (SUTURE) IMPLANT
SYR BULB IRRIGATION 50ML (SYRINGE) ×2 IMPLANT
TOWEL OR 17X24 6PK STRL BLUE (TOWEL DISPOSABLE) ×8 IMPLANT
TRAY FOLEY CATH SILVER 14FR (SET/KITS/TRAYS/PACK) ×4 IMPLANT
TRENDGUARD 450 HYBRID PRO PACK (MISCELLANEOUS)
TRENDGUARD 600 HYBRID PROC PK (MISCELLANEOUS)
TROCAR BALLN 12MMX100 BLUNT (TROCAR) IMPLANT
TROCAR XCEL NON-BLD 11X100MML (ENDOMECHANICALS) IMPLANT
TROCAR XCEL NON-BLD 5MMX100MML (ENDOMECHANICALS) ×2 IMPLANT
WARMER LAPAROSCOPE (MISCELLANEOUS) ×2 IMPLANT
WATER STERILE IRR 1000ML POUR (IV SOLUTION) ×4 IMPLANT

## 2015-11-18 NOTE — Anesthesia Postprocedure Evaluation (Signed)
Anesthesia Post Note  Patient: Carrie Neal  Procedure(s) Performed: Procedure(s) (LRB): HYSTERECTOMY VAGINAL Rightl Salpingectomy (Bilateral) CYSTOSCOPY (N/A)  Patient location during evaluation: PACU Anesthesia Type: General Level of consciousness: sedated Pain management: pain level controlled Vital Signs Assessment: post-procedure vital signs reviewed and stable Cardiovascular status: stable Postop Assessment: no signs of nausea or vomiting Anesthetic complications: no     Last Vitals:  Vitals:   11/18/15 1645 11/18/15 1700  BP: (!) 141/49 133/71  Pulse: 86 82  Resp: 15 16  Temp:      Last Pain:  Vitals:   11/18/15 1700  TempSrc:   PainSc: 0-No pain   Pain Goal: Patients Stated Pain Goal: 3 (11/18/15 1633)               [place]

## 2015-11-18 NOTE — Brief Op Note (Signed)
11/18/2015  4:38 PM  PATIENT:  Carrie Neal  45 y.o. female  PRE-OPERATIVE DIAGNOSIS:  Menorrhagia  POST-OPERATIVE DIAGNOSIS:  MENORRHAGIA  PROCEDURE:  Procedure(s): HYSTERECTOMY VAGINAL Rightl Salpingectomy (Bilateral) CYSTOSCOPY (N/A)  SURGEON:  Surgeon(s) and Role:    * Waymon Amato, MD - Primary    * Ena Dawley, MD - Assisting   ANESTHESIA:   general  EBL:  Total I/O In: [id number] [I.V.:4300] Out: [id number] [Urine:1650; Blood:1500]  BLOOD ADMINISTERED:none  DRAINS: none   LOCAL MEDICATIONS USED:  LIDOCAINE  With epinephrine and Amount: 15 ml  SPECIMEN:  Source of Specimen:  Uterus with cervix.  Right fallopian tube.   DISPOSITION OF SPECIMEN:  PATHOLOGY  COUNTS:  YES  TOURNIQUET:  * No tourniquets in log *  DICTATION: .Note written in [place]: Admit for overnight observation  PATIENT DISPOSITION:  PACU - hemodynamically stable.   Delay start of Pharmacological VTE agent (>24hrs) due to surgical blood loss or risk of bleeding: not applicable

## 2015-11-18 NOTE — Transfer of Care (Signed)
Immediate Anesthesia Transfer of Care Note  Patient: Carrie Neal  Procedure(s) Performed: Procedure(s): HYSTERECTOMY VAGINAL Rightl Salpingectomy (Bilateral) CYSTOSCOPY (N/A)  Patient Location: PACU  Anesthesia Type:General  Level of Consciousness: awake, alert  and oriented  Airway & Oxygen Therapy: Patient Spontanous Breathing and Patient connected to nasal cannula oxygen  Post-op Assessment: Report given to RN and Post -op Vital signs reviewed and stable  Post vital signs: Reviewed and stable  Last Vitals:  Vitals:   11/18/15 1048  BP: (!) 141/93  Pulse: 80  Resp: 18  Temp: 36.8 C    Last Pain:  Vitals:   11/18/15 1048  TempSrc: Oral      Patients Stated Pain Goal: 3 ([id number])  Complications: No apparent anesthesia complications

## 2015-11-18 NOTE — Anesthesia Procedure Notes (Signed)
Procedure Name: Intubation Date/Time: 11/18/2015 12:36 PM Performed by: Flossie Dibble Pre-anesthesia Checklist: Emergency Drugs available, Patient identified, Suction available, Patient being monitored and Timeout performed Patient Re-evaluated:Patient Re-evaluated prior to inductionOxygen Delivery Method: Circle system utilized Preoxygenation: Pre-oxygenation with 100% oxygen Intubation Type: IV induction Ventilation: Mask ventilation without difficulty Laryngoscope Size: Mac and 3 Grade View: Grade I Tube type: Oral Tube size: 7.0 mm Number of attempts: 1 Airway Equipment and Method: Patient positioned with wedge pillow and Stylet Placement Confirmation: ETT inserted through vocal cords under direct vision,  positive ETCO2 and breath sounds checked- equal and bilateral Secured at: 21 cm Tube secured with: Tape Dental Injury: Teeth and Oropharynx as per pre-operative assessment

## 2015-11-18 NOTE — Op Note (Addendum)
Carrie Neal, [place] Female, 45 y.o., [birth year] MRN [id number]  Pre-op diagnosis:  1. Menorrhagia 2. Abnormal uterine bleeding 3. Fibroids 4. Failed conservative management.  5.  Obesity with BMI of 38.    6.  Desire to decrease future risk of ovarian and fallopian tube cancer.   Post-op diagnosis: Same as above.   Surgery: Total vaginal hysterectomy, right salpingectomy.   Surgeon: Dr. Waymon Amato   Assistant: Dr. Eli Hose  Anesthesia: General  Complications: None  Findings: 12 week sized uterus with multiple intramural fibroids.  Normal right ovary and tube.  Left ovary with 4 cm simple cyst with clear watery cystic fluid that was drained. Left tube not visualized.  Indications: 45 y/o P6 with a long history of heavy abnormal bleeding who has failed conservative medical and surgical treatment now desiring definitive treatment by hysterectomy.  Patient also desires to decrease her future risk of fallopian tube and ovarian cancer by salpingectomy.    Procedure: Informed consent was obtained from the patient. She was taken to the operating room where anesthesia was administered.  She was carefully positioned in the operating room table, prepped and draped in the usual sterile fashion.   An exam under anesthesia revealed a 12 week sized uterus with no palpable adnexal masses.    Weighted speculum and anterior Heaney tractors were used to view the cervix.  Two single tooth tenaculum were placed on cervix at 3 and 9 o'clocks.  The cervix was injected with 1% lidocaine with epinephrine circumferentially about 1.5 cm from the external os. about 15cc total used causing blanching of the cervical tissue. Colpotomy was created with bovie.  The vagina was dissected off the cervical stroma anterioly and posteriorly.  The posterior peritoneum of the cul-de-sac was identifed  and then entered sharply with the Mayo scissors.  Entry into the anterior cul de sac was similarly performed after  identification of the vesicouterine fold.   The uterosacral ligaments bilaterally were clamped cut and suture ligated with transfixing stitch.  Cardinal ligaments were similarly clamped, cut and suture ligated with a transfixing stitch. The uterine  Vessels were similarly clamped, cut and suture ligated, all these were done with 0-vicryl.       The vaginal was noted to the long and narrow near the adnexal area.  The broad ligament pedicles were similarly clamped cut and suture ligated or sealed with ligasure then transected depending on their accessibility.  It was difficult to acess the uterine cornua and adnexa and therefore the uterus was morcellated by wedge resection and coring.  An area that bled on the left (patient's side) broad ligament was contained with the ligasure.      The adnexal pedicles were then grapsed with heaney clamp, cut and and suture liagted. The uterus was then delivered.    The right fallopian tube was identified from the fimbria end and excised with the ligasure.  Excellent hemostasis was noted.  Attention then turned to the left side where the left ovary with simple cyst was visualized.  A spinal needle was used to drain the cyst of the fluid.  Further exam revealed missing left fallopian tube (patient reports history of bilateral tubal ligation whereby she had had complications on the left tube, she thinks the left tube was removed then).     All pedicles were examined and noted to the hemostatic.  Irrigation was applied and suctioned out. External McCall Culdoplasty was performed with 2-0 vicryl.  Vaginal wall was closed horizontally with 0-vicryl.  Mcall suture was tied off. Cystoscoppy was perfomed revealing a normal bladder and bilateral patent ureters with jets.  All instruments were removed and count was correct.   She was awoken from anesthesia and taken to recovery room in stable condition.   Specimen: Morcelated uterus with cervix.  Right fallopian tube.      Disposition: TO PACU in stable condition.    Dr.Taner Rzepka.

## 2015-11-18 NOTE — Anesthesia Preprocedure Evaluation (Signed)
Anesthesia Evaluation  Patient identified by MRN, date of birth, ID band Patient awake    Reviewed: Allergy & Precautions, H&P , NPO status , Patient's Chart, lab work & pertinent test results  Airway Mallampati: II  TM Distance: >3 FB Neck ROM: full    Dental no notable dental hx. (+) Teeth Intact   Pulmonary former smoker,    Pulmonary exam normal        Cardiovascular hypertension, negative cardio ROS Normal cardiovascular exam     Neuro/Psych negative neurological ROS  negative psych ROS   GI/Hepatic negative GI ROS, Neg liver ROS,   Endo/Other  negative endocrine ROSdiabetesMorbid obesity  Renal/GU      Musculoskeletal   Abdominal (+) + obese,   Peds  Hematology   Anesthesia Other Findings   Reproductive/Obstetrics negative OB ROS                             Anesthesia Physical Anesthesia Plan  ASA: III  Anesthesia Plan: General   Post-op Pain Management:    Induction: Intravenous  Airway Management Planned: Oral ETT  Additional Equipment:   Intra-op Plan:   Post-operative Plan: Extubation in OR  Informed Consent: I have reviewed the patients History and Physical, chart, labs and discussed the procedure including the risks, benefits and alternatives for the proposed anesthesia with the patient or authorized representative who has indicated his/her understanding and acceptance.   Dental Advisory Given and Dental advisory given  Plan Discussed with: CRNA and Surgeon  Anesthesia Plan Comments:         Anesthesia Quick Evaluation

## 2015-11-19 DIAGNOSIS — Z90710 Acquired absence of both cervix and uterus: Secondary | ICD-10-CM | POA: Diagnosis not present

## 2015-11-19 LAB — BASIC METABOLIC PANEL
Anion gap: 4 — ABNORMAL LOW (ref 5–15)
BUN: 9 mg/dL (ref 6–20)
CALCIUM: 10.3 mg/dL (ref 8.9–10.3)
CO2: 26 mmol/L (ref 22–32)
Chloride: 105 mmol/L (ref 101–111)
Creatinine, Ser: 0.7 mg/dL (ref 0.44–1.00)
GFR calc Af Amer: >60 mL/min (ref >60)
Glucose, Bld: 143 mg/dL — ABNORMAL HIGH (ref 65–99)
Potassium: 3.8 mmol/L (ref 3.5–5.1)
SODIUM: 135 mmol/L (ref 135–145)

## 2015-11-19 LAB — CBC
HEMATOCRIT: 25.5 % — AB (ref 36.0–46.0)
HEMOGLOBIN: 8.3 g/dL — AB (ref 12.0–15.0)
MCH: 24.4 pg — ABNORMAL LOW (ref 26.0–34.0)
MCHC: 32.5 g/dL (ref 30.0–36.0)
MCV: 75 fL — ABNORMAL LOW (ref 78.0–100.0)
Platelets: 231 10*3/uL (ref 150–400)
RBC: 3.4 MIL/uL — AB (ref 3.87–5.11)
RDW: 22.1 % — AB (ref 11.5–15.5)
WBC: 5.5 10*3/uL (ref 4.0–10.5)

## 2015-11-19 LAB — GLUCOSE, CAPILLARY: Glucose-Capillary: 153 mg/dL — ABNORMAL HIGH (ref 65–99)

## 2015-11-19 MED ORDER — OXYCODONE-ACETAMINOPHEN 5-325 MG PO TABS: 1.0000 | ORAL_TABLET | ORAL | 0 refills | Status: DC | PRN

## 2015-11-19 MED ORDER — IBUPROFEN 800 MG PO TABS: 800.0000 mg | ORAL_TABLET | Freq: Three times a day (TID) | ORAL | 0 refills | Status: DC | PRN

## 2015-11-19 MED ORDER — FERROUS SULFATE 325 (65 FE) MG PO TABS: 325.0000 mg | ORAL_TABLET | Freq: Every day | ORAL | 1 refills | Status: DC

## 2015-11-19 NOTE — Progress Notes (Signed)
Client discharged per orders. Home care instructions reviewed with client and daughter at bedside. Reviewed emergent symptoms and when to call MD. Client transported via wheelchair, all questions answered. All belongings sent with patient at discharge

## 2015-11-19 NOTE — Discharge Summary (Signed)
Physician Discharge Summary  Patient ID: Carrie Neal MRN: [id number] DOB/AGE: [birth year] 45 y.o.  Admit date: 11/18/2015 Discharge date: 11/19/2015  Admission Diagnoses:  1. Menorrhagia 2. Abnormal uterine bleeding 3. Fibroids 4. Failed conservative management.  5.  Obesity with BMI of 38.    6.  Desire to decrease future risk of ovarian and fallopian tube cancer.    Discharge Diagnoses:  Active Problems:   Status post vaginal hysterectomy Status post right salpingectomy  Discharged Condition: stable  Hospital Course: Patient was admitted and underwent a vaginal hysterectomy and right salpingectomy.  Left fallopian tube was not visualized and patient reported a history of bilateral tubal ligation with complications in the left tube, it may have already been removed.  By post operative day one she was tolerating a regular diet, ambulating and voiding without difficulty.  Her pain was well controlled.  She had scant vaginal bleeding.  She was deemed stable for discharge.    Consults: None  Significant Diagnostic Studies:   CBC Latest Ref Rng & Units 11/19/2015 11/18/2015 11/18/2015  WBC 4.0 - 10.5 K/uL 5.5 - 5.3  Hemoglobin 12.0 - 15.0 g/dL 8.3(L) 9.6(L) 9.7(L)  Hematocrit 36.0 - 46.0 % 25.5(L) - 29.8(L)  Platelets 150 - 400 K/uL 231 - 252    Treatments: surgery as above.   Discharge Exam: Blood pressure 113/71, pulse 89, temperature 98.2 F (36.8 C), temperature source Oral, resp. rate 18, SpO2 96 %. General appearance: alert and cooperative Resp: clear to auscultation bilaterally Cardio: regular rate and rhythm, S1, S2 normal, no murmur, click, rub or gallop GI: soft, non-tender; bowel sounds normal; no masses,  no organomegaly  Peripad: scant lochia  Disposition: 01-Home or Self Care     Medication List    STOP taking these medications   Levonorgestrel-Ethinyl Estradiol 0.15-0.03 &0.01 MG tablet Commonly known as:  AMETHIA,CAMRESE     TAKE these medications    Dulaglutide 0.75 MG/0.5ML Sopn Commonly known as:  TRULICITY Inject [id number] mg into the skin once a week. What changed:  additional instructions   ferrous sulfate 325 (65 FE) MG tablet Commonly known as:  FERROUSUL Take 1 tablet (325 mg total) by mouth daily with breakfast.   gabapentin 300 MG capsule Commonly known as:  NEURONTIN Take 1 capsule by mouth for 1 day then increase to 1 capsule by mouth twice daily x 1 day then increase to 1 capsule by mouth 3x times daily What changed:  how much to take  how to take this  when to take this  reasons to take this  additional instructions   ibuprofen 800 MG tablet Commonly known as:  ADVIL,MOTRIN Take 1 tablet (800 mg total) by mouth 3 (three) times daily. What changed:  when to take this  reasons to take this   ibuprofen 800 MG tablet Commonly known as:  ADVIL,MOTRIN Take 1 tablet (800 mg total) by mouth every 8 (eight) hours as needed (mild pain). What changed:  You were already taking a medication with the same name, and this prescription was added. Make sure you understand how and when to take each.   oxyCODONE-acetaminophen 5-325 MG tablet Commonly known as:  PERCOCET/ROXICET Take 1-2 tablets by mouth every 4 (four) hours as needed (moderate to severe pain (when tolerating fluids)).      Follow up: Office for 6 week post op check as scheduled.    Activity: Pelvic rest, no heavy lifting.   Disposition: To home.   SignedAlinda Dooms, MD.  11/19/2015, 7:49  AM

## 2015-11-19 NOTE — Anesthesia Postprocedure Evaluation (Signed)
Anesthesia Post Note  Patient: Carrie Neal  Procedure(s) Performed: Procedure(s) (LRB): HYSTERECTOMY VAGINAL Rightl Salpingectomy (Bilateral) CYSTOSCOPY (N/A)  Patient location during evaluation: [facility] Anesthesia Type: General Level of consciousness: awake and alert Pain management: satisfactory to patient Vital Signs Assessment: post-procedure vital signs reviewed and stable Respiratory status: spontaneous breathing and respiratory function stable Cardiovascular status: stable Postop Assessment: adequate PO intake Anesthetic complications: no     Last Vitals:  Vitals:   11/19/15 0143 11/19/15 0624  BP:  113/71  Pulse: 100 89  Resp: 18 18  Temp: 37.1 C 36.8 C    Last Pain:  Vitals:   11/19/15 0624  TempSrc: Oral  PainSc:    Pain Goal: Patients Stated Pain Goal: 3 (11/19/15 0230)               Katherina Mires

## 2015-11-19 NOTE — Discharge Instructions (Signed)
°  Miss Anschutz,  1.  Please take the pain medication as needed for pain.   2. Do call if your pain is not being well controlled with the pain medications.  3. Expect some light vaginal bleeding and soreness in vagina/lower abdomen.  The bleeding may slightly increase in 1- 2 weeks as normal healing process continues, this is normal.  However if bleeding is excessive at any point,i.e you soaking a pad in 1 hour please give me a call.   4. Do not have any sexual intercourse or tampons or douching or baths for the next six weeks.  5. Avoid heavy lifting for the next 6 weeks, i.e do not lift anything more than 20 lb for the next 6 weeks.  6. You have an appointment to see me on 12/18/2015 at 1.30 pm.  Please keep your appointment as scheduled. Please call my office with  Any questions.  I wish you a speedy recovery. Dr. Alesia Richards. [facility] Phone: [phone].

## 2015-11-19 NOTE — Addendum Note (Signed)
Addendum  created 11/19/15 [id number] by Flossie Dibble, CRNA   Sign clinical note

## 2015-11-20 ENCOUNTER — Encounter (HOSPITAL_COMMUNITY): Payer: Self-pay | Admitting: Obstetrics & Gynecology

## 2015-11-22 LAB — TYPE AND SCREEN
ABO/RH(D): O POS
Antibody Screen: NEGATIVE
UNIT DIVISION: 0
UNIT DIVISION: 0
Unit division: 0
Unit division: 0

## 2015-12-16 ENCOUNTER — Other Ambulatory Visit: Payer: Self-pay | Admitting: *Deleted

## 2015-12-16 MED ORDER — DULAGLUTIDE 0.75 MG/0.5ML ~~LOC~~ SOAJ: 0.7500 mg | SUBCUTANEOUS | 2 refills | Status: DC

## 2016-03-02 ENCOUNTER — Emergency Department (HOSPITAL_COMMUNITY): Payer: BLUE CROSS/BLUE SHIELD

## 2016-03-02 ENCOUNTER — Observation Stay (HOSPITAL_COMMUNITY)
Admission: EM | Admit: 2016-03-02 | Discharge: 2016-03-03 | Disposition: A | Discharge status: Home / Self Care | Payer: BLUE CROSS/BLUE SHIELD | Attending: Internal Medicine | Admitting: Internal Medicine

## 2016-03-02 ENCOUNTER — Encounter (HOSPITAL_COMMUNITY): Payer: Self-pay

## 2016-03-02 DIAGNOSIS — Z79899 Other long term (current) drug therapy: Secondary | ICD-10-CM | POA: Diagnosis not present

## 2016-03-02 DIAGNOSIS — Z87442 Personal history of urinary calculi: Secondary | ICD-10-CM | POA: Insufficient documentation

## 2016-03-02 DIAGNOSIS — E876 Hypokalemia: Secondary | ICD-10-CM | POA: Insufficient documentation

## 2016-03-02 DIAGNOSIS — Z7984 Long term (current) use of oral hypoglycemic drugs: Secondary | ICD-10-CM | POA: Diagnosis not present

## 2016-03-02 DIAGNOSIS — E119 Type 2 diabetes mellitus without complications: Secondary | ICD-10-CM | POA: Diagnosis present

## 2016-03-02 DIAGNOSIS — Z833 Family history of diabetes mellitus: Secondary | ICD-10-CM | POA: Diagnosis not present

## 2016-03-02 DIAGNOSIS — D509 Iron deficiency anemia, unspecified: Secondary | ICD-10-CM | POA: Diagnosis not present

## 2016-03-02 DIAGNOSIS — E1110 Type 2 diabetes mellitus with ketoacidosis without coma: Secondary | ICD-10-CM | POA: Diagnosis not present

## 2016-03-02 DIAGNOSIS — E11319 Type 2 diabetes mellitus with unspecified diabetic retinopathy without macular edema: Secondary | ICD-10-CM | POA: Diagnosis not present

## 2016-03-02 DIAGNOSIS — R079 Chest pain, unspecified: Secondary | ICD-10-CM | POA: Diagnosis not present

## 2016-03-02 DIAGNOSIS — R0789 Other chest pain: Secondary | ICD-10-CM | POA: Diagnosis present

## 2016-03-02 DIAGNOSIS — Z9049 Acquired absence of other specified parts of digestive tract: Secondary | ICD-10-CM | POA: Insufficient documentation

## 2016-03-02 DIAGNOSIS — J45909 Unspecified asthma, uncomplicated: Secondary | ICD-10-CM | POA: Insufficient documentation

## 2016-03-02 DIAGNOSIS — Z6839 Body mass index (BMI) 39.0-39.9, adult: Secondary | ICD-10-CM | POA: Insufficient documentation

## 2016-03-02 DIAGNOSIS — E785 Hyperlipidemia, unspecified: Secondary | ICD-10-CM | POA: Insufficient documentation

## 2016-03-02 DIAGNOSIS — Z90710 Acquired absence of both cervix and uterus: Secondary | ICD-10-CM | POA: Diagnosis not present

## 2016-03-02 DIAGNOSIS — Z885 Allergy status to narcotic agent status: Secondary | ICD-10-CM | POA: Insufficient documentation

## 2016-03-02 DIAGNOSIS — I10 Essential (primary) hypertension: Secondary | ICD-10-CM | POA: Diagnosis not present

## 2016-03-02 HISTORY — DX: Morbid (severe) obesity due to excess calories: E66.01

## 2016-03-02 HISTORY — DX: Type 2 diabetes mellitus without complications: E11.9

## 2016-03-02 HISTORY — DX: Other ill-defined heart diseases: I51.89

## 2016-03-02 LAB — CBC
HEMATOCRIT: 36.4 % (ref 36.0–46.0)
HEMATOCRIT: 34 % — AB (ref 36.0–46.0)
Hemoglobin: 11.6 g/dL — ABNORMAL LOW (ref 12.0–15.0)
Hemoglobin: 10.8 g/dL — ABNORMAL LOW (ref 12.0–15.0)
MCH: 24.4 pg — ABNORMAL LOW (ref 26.0–34.0)
MCH: 24.3 pg — ABNORMAL LOW (ref 26.0–34.0)
MCHC: 31.9 g/dL (ref 30.0–36.0)
MCHC: 31.8 g/dL (ref 30.0–36.0)
MCV: 76.6 fL — ABNORMAL LOW (ref 78.0–100.0)
MCV: 76.4 fL — AB (ref 78.0–100.0)
Platelets: 313 10*3/uL (ref 150–400)
Platelets: 259 10*3/uL (ref 150–400)
RBC: 4.75 MIL/uL (ref 3.87–5.11)
RBC: 4.45 MIL/uL (ref 3.87–5.11)
RDW: 18.7 % — AB (ref 11.5–15.5)
RDW: 18.8 % — AB (ref 11.5–15.5)
WBC: 4.8 10*3/uL (ref 4.0–10.5)
WBC: 4 10*3/uL (ref 4.0–10.5)

## 2016-03-02 LAB — D-DIMER, QUANTITATIVE (NOT AT ARMC): D DIMER QUANT: 0.36 ug{FEU}/mL (ref 0.00–0.50)

## 2016-03-02 LAB — BASIC METABOLIC PANEL
ANION GAP: 10 (ref 5–15)
BUN: 7 mg/dL (ref 6–20)
CO2: 22 mmol/L (ref 22–32)
Calcium: 10.8 mg/dL — ABNORMAL HIGH (ref 8.9–10.3)
Chloride: 103 mmol/L (ref 101–111)
Creatinine, Ser: 0.67 mg/dL (ref 0.44–1.00)
Glucose, Bld: 87 mg/dL (ref 65–99)
POTASSIUM: 3 mmol/L — AB (ref 3.5–5.1)
SODIUM: 135 mmol/L (ref 135–145)

## 2016-03-02 LAB — I-STAT TROPONIN, ED: Troponin i, poc: 0 ng/mL (ref 0.00–0.08)

## 2016-03-02 LAB — TROPONIN I: Troponin I: <0.03 ng/mL (ref <0.03)

## 2016-03-02 LAB — CREATININE, SERUM
Creatinine, Ser: 0.59 mg/dL (ref 0.44–1.00)
GFR calc non Af Amer: >60 mL/min (ref >60)

## 2016-03-02 MED ORDER — GI COCKTAIL ~~LOC~~
30.0000 mL | Freq: Once | ORAL | Status: AC
  Administered 2016-03-02: 30 mL via ORAL
  Filled 2016-03-02: qty 30

## 2016-03-02 MED ORDER — ACETAMINOPHEN 325 MG PO TABS
650.0000 mg | ORAL_TABLET | ORAL | Status: DC | PRN
  Administered 2016-03-03 (×2): 650 mg via ORAL
  Filled 2016-03-02 (×2): qty 2

## 2016-03-02 MED ORDER — ASPIRIN 81 MG PO CHEW
324.0000 mg | CHEWABLE_TABLET | Freq: Once | ORAL | Status: AC
  Administered 2016-03-02: 324 mg via ORAL
  Filled 2016-03-02: qty 4

## 2016-03-02 MED ORDER — ONDANSETRON HCL 4 MG/2ML IJ SOLN: 4.0000 mg | Freq: Four times a day (QID) | INTRAMUSCULAR | Status: DC | PRN

## 2016-03-02 MED ORDER — ENOXAPARIN SODIUM 40 MG/0.4ML ~~LOC~~ SOLN
40.0000 mg | SUBCUTANEOUS | Status: DC
  Filled 2016-03-02: qty 0.4

## 2016-03-02 MED ORDER — NITROGLYCERIN 0.4 MG SL SUBL
0.4000 mg | SUBLINGUAL_TABLET | SUBLINGUAL | Status: DC | PRN
  Administered 2016-03-02 (×3): 0.4 mg via SUBLINGUAL
  Filled 2016-03-02: qty 1

## 2016-03-02 MED ORDER — ASPIRIN EC 325 MG PO TBEC
325.0000 mg | DELAYED_RELEASE_TABLET | Freq: Every day | ORAL | Status: DC
  Administered 2016-03-03: 325 mg via ORAL
  Filled 2016-03-02: qty 1

## 2016-03-02 MED ORDER — POTASSIUM CHLORIDE CRYS ER 20 MEQ PO TBCR
40.0000 meq | EXTENDED_RELEASE_TABLET | Freq: Once | ORAL | Status: AC
  Administered 2016-03-02: 40 meq via ORAL
  Filled 2016-03-02: qty 2

## 2016-03-02 MED ORDER — NITROGLYCERIN 0.4 MG/HR TD PT24
0.4000 mg | MEDICATED_PATCH | Freq: Every day | TRANSDERMAL | Status: DC
  Administered 2016-03-03: 0.4 mg via TRANSDERMAL
  Filled 2016-03-02: qty 1

## 2016-03-02 NOTE — ED Notes (Signed)
Pt provided a snack pack and sprite

## 2016-03-02 NOTE — ED Notes (Signed)
Ambulated pt around Pod A on Pulse Ox. Pt started at 98% on room air in the room and stayed at 98% the entire time. Pt stated that she felt a little dizzy and that her chest felt a little tight while ambulating. Pt heart rate stayed around 105 the entire time.

## 2016-03-02 NOTE — ED Triage Notes (Signed)
Pt complaining of central chest tightness. Pt states increased pain with exertion. Pt states some low back pain as well.

## 2016-03-02 NOTE — ED Provider Notes (Signed)
[place] DEPT Provider Note   CSN: [id number] Arrival date & time: 03/02/16  1657     History   Chief Complaint Chief Complaint  Patient presents with  . Chest Pain    HPI Carrie Neal is a 46 y.o. female.  HPI Carrie Neal is a 46 y.o. female with PMH significant for anemia, asthma, DM, and obesity  who presents with gradual onset, constant, moderate substernal chest pain she describes as burning that began at 2 PM today.  Associated symptoms include shortness of breath, dyspnea on exertion, dizziness, and feeling like she may pass out ocassionally.  No syncope.  No fever, chills, cough, abdominal pain, N/V/D, or urinary symptoms.  She did have some N/V 3 days ago that has since resolved.  Pain is not associated with eating.  She states it may seem worse with ambulation, but doesn't improve with rest.  She has not taken anything for her symptoms. No cardiac history.  No hx of PE/DVT, recent immobilization or surgery, or unilateral leg swelling. She denies hx of acid reflux.   Past Medical History:  Diagnosis Date  . Anemia   . Asthma    no inhaler  . Chicken pox   . Diabetes mellitus without complication (HCC)    Type 2  . Dysfunctional uterine bleeding 12/2013  . Hypertension    "years ago was on HTN med"  . Kidney stones     Patient Active Problem List   Diagnosis Date Noted  . Chest pain 03/02/2016  . Status post vaginal hysterectomy 11/18/2015  . Pre-operative clearance 11/07/2015  . Seasonal allergies 05/21/2014  . Type 2 diabetes, controlled, with retinopathy ([place]) 04/27/2014  . DKA (diabetic ketoacidoses) ([place]) 04/13/2014  . Tachycardia 04/13/2014  . Hypercalcemia 04/13/2014  . DUB (dysfunctional uterine bleeding) 03/22/2014  . Anemia 03/22/2014  . Severe anemia 03/22/2014    Past Surgical History:  Procedure Laterality Date  . CHOLECYSTECTOMY    . CYSTOSCOPY N/A 11/18/2015   Procedure: CYSTOSCOPY;  Surgeon: Waymon Amato, MD;  Location: [facility];  Service:  Gynecology;  Laterality: N/A;  . DILATATION & CURETTAGE/HYSTEROSCOPY WITH MYOSURE N/A 03/30/2014   Procedure: DILATATION & CURETTAGE/HYSTEROSCOPY WITH MYOSURE and placement of Mirena Intrauterine device;  Surgeon: Alinda Dooms, MD;  Location: [facility];  Service: Gynecology;  Laterality: N/A;  . HERNIA REPAIR    . TUBAL LIGATION    . UMBILICAL HERNIA REPAIR     as child  . VAGINAL HYSTERECTOMY Bilateral 11/18/2015   Procedure: HYSTERECTOMY VAGINAL Rightl Salpingectomy;  Surgeon: Waymon Amato, MD;  Location: [facility];  Service: Gynecology;  Laterality: Bilateral;    OB History    Gravida Para Term Preterm AB Living   6 6 6    0 6   SAB TAB Ectopic Multiple Live Births   0               Home Medications    Prior to Admission medications   Medication Sig Start Date End Date Taking? Authorizing Provider  Dulaglutide (TRULICITY) A999333 0000000 SOPN Inject 0.75 mg into the skin once a week. Patient taking differently: Inject 0.75 mg into the skin every Thursday.  12/16/15  Yes Golden Circle, FNP  gabapentin (NEURONTIN) 300 MG capsule Take 1 capsule by mouth for 1 day then increase to 1 capsule by mouth twice daily x 1 day then increase to 1 capsule by mouth 3x times daily Patient taking differently: Take 300 mg by mouth 2 (two) times daily as needed (leg  pain).  08/15/15  Yes Golden Circle, FNP  ibuprofen (ADVIL,MOTRIN) 800 MG tablet Take 1 tablet (800 mg total) by mouth 3 (three) times daily. Patient taking differently: Take 800 mg by mouth every 8 (eight) hours as needed for moderate pain.  09/23/15  Yes Waymon Amato, MD  oxyCODONE-acetaminophen (PERCOCET/ROXICET) 5-325 MG tablet Take 1-2 tablets by mouth every 4 (four) hours as needed (moderate to severe pain (when tolerating fluids)). 11/19/15  Yes Waymon Amato, MD  ferrous sulfate (FERROUSUL) 325 (65 FE) MG tablet Take 1 tablet (325 mg total) by mouth daily with breakfast. Patient not taking: Reported on 03/02/2016 11/19/15   Waymon Amato, MD     Family History Family History  Problem Relation Age of Onset  . Diabetes Mother   . Diabetes Cousin   . Diabetes Maternal Grandmother     Social History Social History  Substance Use Topics  . Smoking status: Former Smoker    Years: 4.00    Types: Cigarettes  . Smokeless tobacco: Never Used  . Alcohol use No     Allergies   Codeine   Review of Systems Review of Systems All other systems negative unless otherwise stated in HPI   Physical Exam Updated Vital Signs BP 118/66   Pulse 101   Temp 98.3 F (36.8 C)   Resp 18   LMP  (LMP Unknown)   SpO2 97%   Physical Exam  Constitutional: She is oriented to person, place, and time. She appears well-developed and well-nourished.  Non-toxic appearance. She does not have a sickly appearance. She does not appear ill.  HENT:  Head: Normocephalic and atraumatic.  Mouth/Throat: Oropharynx is clear and moist.  Eyes: Conjunctivae are normal. Pupils are equal, round, and reactive to light.  Neck: Normal range of motion. Neck supple.  Cardiovascular: Normal rate and regular rhythm.   Pulmonary/Chest: Effort normal and breath sounds normal. No accessory muscle usage or stridor. No respiratory distress. She has no wheezes. She has no rhonchi. She has no rales.  Abdominal: Soft. Bowel sounds are normal. She exhibits no distension. There is no tenderness.  Musculoskeletal: Normal range of motion.  Lymphadenopathy:    She has no cervical adenopathy.  Neurological: She is alert and oriented to person, place, and time.  Speech clear without dysarthria.  Skin: Skin is warm and dry.  Psychiatric: She has a normal mood and affect. Her behavior is normal.     ED Treatments / Results  Labs (all labs ordered are listed, but only abnormal results are displayed) Labs Reviewed  BASIC METABOLIC PANEL - Abnormal; Notable for the following:       Result Value   Potassium 3.0 (*)    Calcium 10.8 (*)    All other components within  normal limits  CBC - Abnormal; Notable for the following:    Hemoglobin 11.6 (*)    MCV 76.6 (*)    MCH 24.4 (*)    RDW 18.7 (*)    All other components within normal limits  I-STAT TROPOININ, ED    EKG  EKG Interpretation  Date/Time:  Monday March 02 2016 17:02:03 EST Ventricular Rate:  99 PR Interval:  124 QRS Duration: 88 QT Interval:  348 QTC Calculation: 446 R Axis:   48 Text Interpretation:  Normal sinus rhythm Nonspecific ST and T wave abnormality Abnormal ECG Since last EKG,  ST changes diffusely, worse in inferior leads, non-specific Confirmed by ISAACS MD, CAMERON [phone]) on 03/02/2016 7:41:35 PM  Radiology Dg Chest 2 View  Result Date: 03/02/2016 CLINICAL DATA:  Chest pain and shortness of breath EXAM: CHEST  2 VIEW COMPARISON:  January 06, 2015 FINDINGS: Lungs are clear. Heart size and pulmonary vascularity are normal. No adenopathy. No pneumothorax. No bone lesions. IMPRESSION: No edema or consolidation. Electronically Signed   By: Lowella Grip III M.D.   On: 03/02/2016 17:43    Procedures Procedures (including critical care time)  Medications Ordered in ED Medications  nitroGLYCERIN (NITROSTAT) SL tablet 0.4 mg (0.4 mg Sublingual Given 03/02/16 2040)  potassium chloride SA (K-DUR,KLOR-CON) CR tablet 40 mEq (40 mEq Oral Given 03/02/16 1927)  gi cocktail (Maalox,Lidocaine,Donnatal) (30 mLs Oral Given 03/02/16 1927)  aspirin chewable tablet 324 mg (324 mg Oral Given 03/02/16 2026)     Initial Impression / Assessment and Plan / ED Course  I have reviewed the triage vital signs and the nursing notes.  Pertinent labs & imaging results that were available during my care of the patient were reviewed by me and considered in my medical decision making (see chart for details).  Clinical Course    Patient presents with gradual onset, constant, unchanging chest tightness that began at 2 PM today. Associated symptoms include shortness of breath, dizziness,  and feeling faint. No episodes of syncope. EKG is changed from prior, showing diffuse ST changes, maybe more so in the inferior leads. Initial troponin normal. Chest x-ray clear. She is mildly hypokalemic, this is repleted in the ED. She is able to ambulate in the ED, however this worsened her symptoms. She received GI cocktail, aspirin, and nitroglycerin. She felt improved after the nitroglycerin.  HEAR score 4. I believe patient would benefit from admission for high risk chest pain observation. I have low suspicion for PE, dissection, pericarditis. Her abdomen is soft and nontender, and this does not seem to be worsened with eating; therefore, at low suspicion for gallbladder etiology.  Case has been discussed with Dr. Ellender Hose who agrees with the above plan for admission.   Final Clinical Impressions(s) / ED Diagnoses   Final diagnoses:  Chest tightness    New Prescriptions New Prescriptions   No medications on file     Vivien Rossetti 03/02/16 2128    Duffy Bruce, MD 03/04/16 1254

## 2016-03-02 NOTE — H&P (Signed)
History and Physical    Carrie Neal [id number] DOB: 17-Sep-1970 DOA: 03/02/2016  PCP: Mauricio Po, FNP  Patient coming from: Home.  Chief Complaint: Chest pain.  HPI: Carrie Neal is a 46 y.o. female with diabetes mellitus, anemia presented to the ER because of chest pain. Patient started experiencing chest pain around 2 PM today. Pain is retrosternal pressure like at times burning in sensation. Patient's pain continued until patient reached ER. In the ER patient's chest pain improved with sublingual nitroglycerin but started recurring again. Patient's chest pain increases on exertion. Denies any associated shortness of breath or cough fever or chills. Denies any abdominal pain nausea vomiting. Chest x-ray was unremarkable. EKG was showing nonspecific ST changes. Troponin was negative. Given the risk factors including diabetes patient has been admitted for further workup for chest pain.   ED Course: Patient was given sublingual nitroglycerin and aspirin. Chest x-ray unremarkable EKG showing nonspecific findings.  Review of Systems: As per HPI, rest all negative.   Past Medical History:  Diagnosis Date  . Anemia   . Asthma    no inhaler  . Chicken pox   . Diabetes mellitus without complication (HCC)    Type 2  . Dysfunctional uterine bleeding 12/2013  . Hypertension    "years ago was on HTN med"  . Kidney stones     Past Surgical History:  Procedure Laterality Date  . CHOLECYSTECTOMY    . CYSTOSCOPY N/A 11/18/2015   Procedure: CYSTOSCOPY;  Surgeon: Waymon Amato, MD;  Location: [facility];  Service: Gynecology;  Laterality: N/A;  . DILATATION & CURETTAGE/HYSTEROSCOPY WITH MYOSURE N/A 03/30/2014   Procedure: DILATATION & CURETTAGE/HYSTEROSCOPY WITH MYOSURE and placement of Mirena Intrauterine device;  Surgeon: Alinda Dooms, MD;  Location: [facility];  Service: Gynecology;  Laterality: N/A;  . HERNIA REPAIR    . TUBAL LIGATION    . UMBILICAL HERNIA REPAIR     as child  . VAGINAL  HYSTERECTOMY Bilateral 11/18/2015   Procedure: HYSTERECTOMY VAGINAL Rightl Salpingectomy;  Surgeon: Waymon Amato, MD;  Location: [facility];  Service: Gynecology;  Laterality: Bilateral;     reports that she has quit smoking. Her smoking use included Cigarettes. She quit after 4.00 years of use. She has never used smokeless tobacco. She reports that she does not drink alcohol or use drugs.  Allergies  Allergen Reactions  . Codeine Itching    Family History  Problem Relation Age of Onset  . Diabetes Mother   . Diabetes Cousin   . Diabetes Maternal Grandmother     Prior to Admission medications   Medication Sig Start Date End Date Taking? Authorizing Provider  Dulaglutide (TRULICITY) A999333 0000000 SOPN Inject 0.75 mg into the skin once a week. Patient taking differently: Inject 0.75 mg into the skin every Thursday.  12/16/15  Yes Golden Circle, FNP  gabapentin (NEURONTIN) 300 MG capsule Take 1 capsule by mouth for 1 day then increase to 1 capsule by mouth twice daily x 1 day then increase to 1 capsule by mouth 3x times daily Patient taking differently: Take 300 mg by mouth 2 (two) times daily as needed (leg pain).  08/15/15  Yes Golden Circle, FNP  ibuprofen (ADVIL,MOTRIN) 800 MG tablet Take 1 tablet (800 mg total) by mouth 3 (three) times daily. Patient taking differently: Take 800 mg by mouth every 8 (eight) hours as needed for moderate pain.  09/23/15  Yes Waymon Amato, MD  oxyCODONE-acetaminophen (PERCOCET/ROXICET) 5-325 MG tablet Take 1-2 tablets by mouth  every 4 (four) hours as needed (moderate to severe pain (when tolerating fluids)). 11/19/15  Yes Waymon Amato, MD  ferrous sulfate (FERROUSUL) 325 (65 FE) MG tablet Take 1 tablet (325 mg total) by mouth daily with breakfast. Patient not taking: Reported on 03/02/2016 11/19/15   Waymon Amato, MD    Physical Exam: Vitals:   03/02/16 2100 03/02/16 2115 03/02/16 2130 03/02/16 2200  BP: 121/67 116/76 134/66 111/69  Pulse: 85 84 83 86  Resp: 16 15  20 19   Temp:      SpO2: 97% 96% 98% 98%      Constitutional: Moderately built and nourished. Vitals:   03/02/16 2100 03/02/16 2115 03/02/16 2130 03/02/16 2200  BP: 121/67 116/76 134/66 111/69  Pulse: 85 84 83 86  Resp: 16 15 20 19   Temp:      SpO2: 97% 96% 98% 98%   Eyes: Anicteric. No pallor. ENMT: No discharge from the ears eyes nose and mouth. Neck: No mass felt. No neck rigidity. Respiratory: No rhonchi or crepitations. Cardiovascular: S1 and S2 heard. Abdomen: Soft nontender bowel sounds present. Musculoskeletal: No edema. Skin: No rash. Neurologic: Alert awake oriented to time place and person. Moves all extremities. Psychiatric: Appears normal. Normal affect.   Labs on Admission: I have personally reviewed following labs and imaging studies  CBC:  Recent Labs Lab 03/02/16 1715  WBC 4.8  HGB 11.6*  HCT 36.4  MCV 76.6*  PLT [id number]   Basic Metabolic Panel:  Recent Labs Lab 03/02/16 1715  NA 135  K 3.0*  CL 103  CO2 22  GLUCOSE 87  BUN 7  CREATININE 0.67  CALCIUM 10.8*   GFR: CrCl cannot be calculated (Unknown ideal weight.). Liver Function Tests: No results for input(s): AST, ALT, ALKPHOS, BILITOT, PROT, ALBUMIN in the last 168 hours. No results for input(s): LIPASE, AMYLASE in the last 168 hours. No results for input(s): AMMONIA in the last 168 hours. Coagulation Profile: No results for input(s): INR, PROTIME in the last 168 hours. Cardiac Enzymes: No results for input(s): CKTOTAL, CKMB, CKMBINDEX, TROPONINI in the last 168 hours. BNP (last 3 results) No results for input(s): PROBNP in the last 8760 hours. HbA1C: No results for input(s): HGBA1C in the last 72 hours. CBG: No results for input(s): GLUCAP in the last 168 hours. Lipid Profile: No results for input(s): CHOL, HDL, LDLCALC, TRIG, CHOLHDL, LDLDIRECT in the last 72 hours. Thyroid Function Tests: No results for input(s): TSH, T4TOTAL, FREET4, T3FREE, THYROIDAB in the last 72  hours. Anemia Panel: No results for input(s): VITAMINB12, FOLATE, FERRITIN, TIBC, IRON, RETICCTPCT in the last 72 hours. Urine analysis:    Component Value Date/Time   COLORURINE YELLOW 09/23/2015 1120   APPEARANCEUR CLEAR 09/23/2015 1120   LABSPEC 1.020 09/23/2015 1120   PHURINE 5.5 09/23/2015 1120   GLUCOSEU >1000 (A) 09/23/2015 1120   HGBUR LARGE (A) 09/23/2015 1120   BILIRUBINUR NEGATIVE 09/23/2015 1120   KETONESUR NEGATIVE 09/23/2015 1120   PROTEINUR NEGATIVE 09/23/2015 1120   UROBILINOGEN 0.2 08/20/2015 1144   NITRITE NEGATIVE 09/23/2015 1120   LEUKOCYTESUR NEGATIVE 09/23/2015 1120   Sepsis Labs: @LABRCNTIP (procalcitonin:4,lacticidven:4) )No results found for this or any previous visit (from the past 240 hour(s)).   Radiological Exams on Admission: Dg Chest 2 View  Result Date: 03/02/2016 CLINICAL DATA:  Chest pain and shortness of breath EXAM: CHEST  2 VIEW COMPARISON:  January 06, 2015 FINDINGS: Lungs are clear. Heart size and pulmonary vascularity are normal. No adenopathy. No pneumothorax. No bone lesions.  IMPRESSION: No edema or consolidation. Electronically Signed   By: Lowella Grip III M.D.   On: 03/02/2016 17:43    EKG: Independently reviewed. Normal sinus rhythm with nonspecific ST changes.  Assessment/Plan Principal Problem:   Chest pain Active Problems:   Hypercalcemia   Type 2 diabetes, controlled, with retinopathy ([place])    1. Chest pain - since patient's chest pain is recurring I have placed patient on nitroglycerin patch. Continue aspirin. Check 2-D echo. Cycle cardiac markers check d-dimer. Will keep patient nothing by mouth in a.m. in anticipation of cardiac procedure. I have requested cardiology consult. 2. Diabetes mellitus type 2 - patient states her hemoglobin A1c was around 6. Continue to trulicity. 3. Hypercalcemia - will need further workup as outpatient. 4. History of iron deficiency anemia - follow CBC and continue iron  supplements.  Pregnancy screen is pending.   DVT prophylaxis: Lovenox. Code Status: Full code.  Family Communication: Discussed with patient's daughter.  Disposition Plan: Home.  Consults called: Cardiology consult requested.  Admission status: Observation.    Rise Patience MD [facility] Pager [phone].  If 7PM-7AM, please contact night-coverage [email or web address] Password [facility]  03/02/2016, 10:35 PM

## 2016-03-02 NOTE — ED Notes (Signed)
Attempted report x 1; name and call back number provided 

## 2016-03-03 ENCOUNTER — Encounter (HOSPITAL_COMMUNITY): Payer: Self-pay | Admitting: Nurse Practitioner

## 2016-03-03 ENCOUNTER — Observation Stay (HOSPITAL_BASED_OUTPATIENT_CLINIC_OR_DEPARTMENT_OTHER): Payer: BLUE CROSS/BLUE SHIELD

## 2016-03-03 ENCOUNTER — Other Ambulatory Visit: Payer: Self-pay | Admitting: Nurse Practitioner

## 2016-03-03 ENCOUNTER — Observation Stay (HOSPITAL_COMMUNITY): Payer: BLUE CROSS/BLUE SHIELD

## 2016-03-03 DIAGNOSIS — Z8249 Family history of ischemic heart disease and other diseases of the circulatory system: Secondary | ICD-10-CM

## 2016-03-03 DIAGNOSIS — E11319 Type 2 diabetes mellitus with unspecified diabetic retinopathy without macular edema: Secondary | ICD-10-CM

## 2016-03-03 DIAGNOSIS — R079 Chest pain, unspecified: Secondary | ICD-10-CM

## 2016-03-03 DIAGNOSIS — I10 Essential (primary) hypertension: Secondary | ICD-10-CM

## 2016-03-03 DIAGNOSIS — R0789 Other chest pain: Secondary | ICD-10-CM | POA: Diagnosis not present

## 2016-03-03 DIAGNOSIS — R072 Precordial pain: Secondary | ICD-10-CM

## 2016-03-03 LAB — TROPONIN I: Troponin I: <0.03 ng/mL (ref <0.03)

## 2016-03-03 LAB — GLUCOSE, CAPILLARY
Glucose-Capillary: 81 mg/dL (ref 65–99)
Glucose-Capillary: 95 mg/dL (ref 65–99)

## 2016-03-03 LAB — ECHOCARDIOGRAM COMPLETE
Height: 67 in
Weight: 4070.57 oz

## 2016-03-03 LAB — BASIC METABOLIC PANEL
ANION GAP: 6 (ref 5–15)
BUN: 6 mg/dL (ref 6–20)
CALCIUM: 10.4 mg/dL — AB (ref 8.9–10.3)
CO2: 24 mmol/L (ref 22–32)
Chloride: 108 mmol/L (ref 101–111)
Creatinine, Ser: 0.55 mg/dL (ref 0.44–1.00)
Glucose, Bld: 85 mg/dL (ref 65–99)
Potassium: 3.5 mmol/L (ref 3.5–5.1)
Sodium: 138 mmol/L (ref 135–145)

## 2016-03-03 LAB — HCG, SERUM, QUALITATIVE: Preg, Serum: NEGATIVE

## 2016-03-03 LAB — MRSA PCR SCREENING: MRSA by PCR: NEGATIVE

## 2016-03-03 MED ORDER — PANTOPRAZOLE SODIUM 40 MG PO TBEC
40.0000 mg | DELAYED_RELEASE_TABLET | Freq: Every day | ORAL | Status: DC
  Administered 2016-03-03: 40 mg via ORAL
  Filled 2016-03-03: qty 1

## 2016-03-03 MED ORDER — ASPIRIN EC 81 MG PO TBEC: 81.0000 mg | DELAYED_RELEASE_TABLET | Freq: Every day | ORAL | Status: DC

## 2016-03-03 MED ORDER — WHITE PETROLATUM GEL
Status: AC
  Administered 2016-03-03: 14:00:00
  Filled 2016-03-03: qty 1

## 2016-03-03 MED ORDER — INSULIN ASPART 100 UNIT/ML ~~LOC~~ SOLN: 0.0000 [IU] | Freq: Three times a day (TID) | SUBCUTANEOUS | Status: DC

## 2016-03-03 MED ORDER — PANTOPRAZOLE SODIUM 40 MG PO TBEC: 40.0000 mg | DELAYED_RELEASE_TABLET | Freq: Every day | ORAL | 0 refills | Status: DC

## 2016-03-03 NOTE — ED Notes (Signed)
Pt placed on hospital bed at this time.  

## 2016-03-03 NOTE — ED Notes (Signed)
Ordered Hospital bed  

## 2016-03-03 NOTE — Progress Notes (Signed)
Patient to be discharged to home with family. CCMD notified. PIV x1 removed. Discharge instructions reviewed with patient. Cardiologist's office expected to call patient to arrange follow up appt; patient aware. Prescription and all belongings sent with patient.   Joellen Jersey, RN.

## 2016-03-03 NOTE — Discharge Summary (Signed)
Physician Discharge Summary  Carrie Neal [id number] DOB: 1970/06/14 DOA: 03/02/2016  PCP: Mauricio Po, FNP  Admit date: 03/02/2016 Discharge date: 03/03/2016  Time spent: 45 minutes  Recommendations for Outpatient Follow-up:  1. PCP in 1 week 2. FU with Cardiology Dr.Nelson for Outpatient Coronary CTA, office will call  Discharge Diagnoses:  Principal Problem:   Chest pain   Hypercalcemia   Type 2 diabetes, controlled, with retinopathy ([place])   Discharge Condition: stable  Diet recommendation: Diabetic  Filed Weights   03/03/16 0244  Weight: 115.4 kg (254 lb 6.6 oz)    History of present illness:  Carrie Neal is a 46 y.o. female with diabetes mellitus, anemia presented to the ER because of chest pain. Patient started experiencing chest pain around 2 PM today. Pain is retrosternal pressure like at times burning in sensation. Patient's pain continued until patient reached ER. In the ER patient's chest pain improved with sublingual nitroglycerin but started recurring again. Patient's chest pain increases on exertion. Denies any associated shortness of breath or cough fever or chills. Denies any abdominal pain nausea vomiting. Chest x-ray was unremarkable. EKG was showing nonspecific ST changes. Troponin was negative  Hospital Course:  1. Chest pain  -atypical with some typical chest pain -EKG with non specific T wave changes -ruled out for ACS, troponin negative -Seen by cardiology and Outpatient Coronary CTA recommended -2D ECHO with Hyperdynamic LVEF and Normal wall motion  2. Diabetes mellitus type 2 -  - Continue to trulicity, SSI  3. Hypercalcemia - Ca is 10.4, needs outpatient Workup if Ca still elevated at FU  4. History of iron deficiency anemia  -continue iron supplements  Consultations:  Cardiology  Discharge Exam: Vitals:   03/03/16 0900 03/03/16 1119  BP: 123/76 128/82  Pulse: 85 (!) 103  Resp: 15 18  Temp:  98.3 F (36.8 C)    General:  AAOx3 Cardiovascular: S1S2/RRR Respiratory: CTAB Discharge Instructions    Current Discharge Medication List    START taking these medications   Details  aspirin EC 81 MG tablet Take 1 tablet (81 mg total) by mouth daily.    pantoprazole (PROTONIX) 40 MG tablet Take 1 tablet (40 mg total) by mouth daily at 6 (six) AM. Qty: 30 tablet, Refills: 0      CONTINUE these medications which have NOT CHANGED   Details  Dulaglutide (TRULICITY) A999333 0000000 SOPN Inject 0.75 mg into the skin once a week. Qty: 6 pen, Refills: 2    gabapentin (NEURONTIN) 300 MG capsule Take 1 capsule by mouth for 1 day then increase to 1 capsule by mouth twice daily x 1 day then increase to 1 capsule by mouth 3x times daily Qty: 90 capsule, Refills: 0    ferrous sulfate (FERROUSUL) 325 (65 FE) MG tablet Take 1 tablet (325 mg total) by mouth daily with breakfast. Qty: 30 tablet, Refills: 1      STOP taking these medications     ibuprofen (ADVIL,MOTRIN) 800 MG tablet      oxyCODONE-acetaminophen (PERCOCET/ROXICET) 5-325 MG tablet        Allergies  Allergen Reactions  . Codeine Itching      The results of significant diagnostics from this hospitalization (including imaging, microbiology, ancillary and laboratory) are listed below for reference.    Significant Diagnostic Studies: Dg Chest 2 View  Result Date: 03/02/2016 CLINICAL DATA:  Chest pain and shortness of breath EXAM: CHEST  2 VIEW COMPARISON:  January 06, 2015 FINDINGS: Lungs are clear. Heart size and  pulmonary vascularity are normal. No adenopathy. No pneumothorax. No bone lesions. IMPRESSION: No edema or consolidation. Electronically Signed   By: Lowella Grip III M.D.   On: 03/02/2016 17:43    Microbiology: Recent Results (from the past 240 hour(s))  MRSA PCR Screening     Status: None   Collection Time: 03/03/16  2:39 AM  Result Value Ref Range Status   MRSA by PCR NEGATIVE NEGATIVE Final    Comment:        The GeneXpert  MRSA Assay (FDA approved for NASAL specimens only), is one component of a comprehensive MRSA colonization surveillance program. It is not intended to diagnose MRSA infection nor to guide or monitor treatment for MRSA infections.      Labs: Basic Metabolic Panel:  Recent Labs Lab 03/02/16 1715 03/02/16 2311 03/03/16 1050  NA 135  --  138  K 3.0*  --  3.5  CL 103  --  108  CO2 22  --  24  GLUCOSE 87  --  85  BUN 7  --  6  CREATININE 0.67 0.59 0.55  CALCIUM 10.8*  --  10.4*   Liver Function Tests: No results for input(s): AST, ALT, ALKPHOS, BILITOT, PROT, ALBUMIN in the last 168 hours. No results for input(s): LIPASE, AMYLASE in the last 168 hours. No results for input(s): AMMONIA in the last 168 hours. CBC:  Recent Labs Lab 03/02/16 1715 03/02/16 2311  WBC 4.8 4.0  HGB 11.6* 10.8*  HCT 36.4 34.0*  MCV 76.6* 76.4*  PLT 313 259   Cardiac Enzymes:  Recent Labs Lab 03/02/16 2311 03/03/16 0515 03/03/16 1050  TROPONINI <0.03 <0.03 <0.03   BNP: BNP (last 3 results) No results for input(s): BNP in the last 8760 hours.  ProBNP (last 3 results) No results for input(s): PROBNP in the last 8760 hours.  CBG:  Recent Labs Lab 03/03/16 0740 03/03/16 1117  GLUCAP 81 95       SignedDomenic Polite MD.  [facility] 03/03/2016, 1:34 PM

## 2016-03-03 NOTE — Consult Note (Signed)
Cardiology Consult    Patient ID: Carrie Neal MRN: [id number], DOB/AGE: [birth year]   Admit date: 03/02/2016 Date of Consult: 03/03/2016  Primary Physician: Mauricio Po, [place] Primary Cardiologist: New - seen by Liane Comber, MD  Requesting Provider: Fanny Bien, MD  Patient Profile    46 y/o ? without prior cardiac history. She does have a history of diastolic dysfunction, hypertension, diabetes, and obesity. She presented to the ED on January 15 with prolonged chest pain.  Past Medical History   Past Medical History:  Diagnosis Date  . Anemia   . Asthma    no inhaler  . Chicken pox   . Diastolic dysfunction    a. 03/2014 Echo: EF 65-70%, gr1 DD, no rwma.  . Dysfunctional uterine bleeding    a. 11/2015 s/p partial hysterectomy.  . Hypertension   . Kidney stones   . Morbid obesity ([place])   . Type 2 diabetes mellitus ([place])    a. Dx 2016.    Past Surgical History:  Procedure Laterality Date  . CHOLECYSTECTOMY    . CYSTOSCOPY N/A 11/18/2015   Procedure: CYSTOSCOPY;  Surgeon: Waymon Amato, MD;  Location: [facility];  Service: Gynecology;  Laterality: N/A;  . DILATATION & CURETTAGE/HYSTEROSCOPY WITH MYOSURE N/A 03/30/2014   Procedure: DILATATION & CURETTAGE/HYSTEROSCOPY WITH MYOSURE and placement of Mirena Intrauterine device;  Surgeon: Alinda Dooms, MD;  Location: [facility];  Service: Gynecology;  Laterality: N/A;  . HERNIA REPAIR    . TUBAL LIGATION    . UMBILICAL HERNIA REPAIR     as child  . VAGINAL HYSTERECTOMY Bilateral 11/18/2015   Procedure: HYSTERECTOMY VAGINAL Rightl Salpingectomy;  Surgeon: Waymon Amato, MD;  Location: [facility];  Service: Gynecology;  Laterality: Bilateral;     Allergies  Allergies  Allergen Reactions  . Codeine Itching    History of Present Illness    46 year old female with a prior history of diabetes, hypertension, obesity, and diastolic dysfunction. Diabetes was diagnosed approximately 2 years ago in the setting of severe hyperglycemia. An  echocardiogram was performed at that time showing normal LV function with grade 1 diastolic dysfunction. She is never required ischemic testing. 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 She underwent partial hysterectomy secondary to dysfunctional uterine bleeding in October 2017. She says that she did well perioperatively without any cardiac complications. She returned to work sometime in December and has since been doing well without any significant limitations in activity.  She was in her usual state of health until the morning of January 15, when she developed a headache at about 3 AM. Between 3 AM an approximate 5 PM, she took a total of 7 Excedrin migraines (acetaminophen 250 mg, aspirin 250 mg, caffeine 65 mg-per tablet). She worked yesterday and continued to have a headache that became associated with nausea and multiple episodes of vomiting. Around 3 PM, she was sweeping the floor and developed sudden onset of substernal chest heaviness and pressure that became more tight in nature. This was associated with dyspnea and she also felt like her heart was racing.  She was mildly lightheaded. She eventually stopped doing what she was doing and drove home. She felt very weak and continued to have chest pain and dyspnea. Husband drove her to the ED @ 5 PM.  There, ECG was nonacute (she says that heart was still racing @ that point - HR 99, sinus rhythm). Troponin was normal. She was given GI cocktail without relief. She was  subsequently given sublingual nitroglycerin and she says she began to experience relief in chest pain at that point. That said, chest pain persisted throughout the night and she says only completely relieved earlier this morning. Despite prolonged symptoms, troponins remained negative 3. She is currently pain-free.   Inpatient Medications    . aspirin EC  325 mg Oral Daily  . enoxaparin (LOVENOX) injection  40 mg Subcutaneous Q24H  . insulin  aspart  0-9 Units Subcutaneous TID WC  . nitroGLYCERIN  0.4 mg Transdermal Daily  . pantoprazole  40 mg Oral Q0600    Family History    Family History  Problem Relation Age of Onset  . Diabetes Mother   . Diabetes Cousin   . Diabetes Maternal Grandmother     Social History    Social History   Social History  . Marital status: Married    Spouse name: N/A  . Number of children: 6  . Years of education: 9   Occupational History  . CNA    Social History Main Topics  . Smoking status: Former Smoker    Years: 4.00    Types: Cigarettes  . Smokeless tobacco: Never Used  . Alcohol use No  . Drug use: No  . Sexual activity: Yes    Birth control/ protection: Surgical   Other Topics Concern  . Not on file   Social History Narrative   Born and raised in [place], [place]. Fun: shop   Denies religious beliefs effecting healthcare.    Works as in Midwife.   Does not routinely exercise.     Review of Systems    General:  No chills, fever, night sweats or weight changes.  Cardiovascular:  +++ chest pain, +++ dyspnea on exertion, no edema, orthopnea, palpitations, paroxysmal nocturnal dyspnea. Dermatological: No rash, lesions/masses Respiratory: No cough, +++ dyspnea Urologic: No hematuria, dysuria Abdominal:   +++ nausea, +++ vomiting, no diarrhea, bright red blood per rectum, melena, or hematemesis Neurologic:  No visual changes, wkns, changes in mental status. All other systems reviewed and are otherwise negative except as noted above.  Physical Exam    Blood pressure 128/82, pulse (!) 103, temperature 98.3 F (36.8 C), temperature source Oral, resp. rate 18, height 5\' 7"  (1.702 m), weight 254 lb 6.6 oz (115.4 kg), SpO2 95 %.  General: Pleasant, NAD Psych: Normal affect. Neuro: Alert and oriented X 3. Moves all extremities spontaneously. HEENT: Normal  Neck: Supple without bruits.  Obese, difficult to gauge JVP. Lungs:  Resp regular and unlabored, CTA. Heart: RRR no  s3, s4, or murmurs. Abdomen: Soft, non-tender, non-distended, BS + x 4.  Extremities: No clubbing, cyanosis or edema. DP/PT/Radials 2+ and equal bilaterally.  Labs    Troponin [facility] Test)  Recent Labs  03/02/16 1740  TROPIPOC 0.00    Recent Labs  03/02/16 2311 03/03/16 0515 03/03/16 1050  TROPONINI <0.03 <0.03 <0.03   Lab Results  Component Value Date   WBC 4.0 03/02/2016   HGB 10.8 (L) 03/02/2016   HCT 34.0 (L) 03/02/2016   MCV 76.4 (L) 03/02/2016   PLT 259 03/02/2016    Recent Labs Lab 03/03/16 1050  NA 138  K 3.5  CL 108  CO2 24  BUN 6  CREATININE 0.55  CALCIUM 10.4*  GLUCOSE 85   Lab Results  Component Value Date   DDIMER 0.36 03/02/2016     Radiology Studies    Dg Chest 2 View  Result Date: 03/02/2016 CLINICAL DATA:  Chest pain and shortness of breath EXAM: CHEST  2 VIEW COMPARISON:  January 06, 2015 FINDINGS: Lungs are clear. Heart size and pulmonary vascularity are normal. No adenopathy. No pneumothorax. No bone lesions. IMPRESSION: No edema or consolidation. Electronically Signed   By: Lowella Grip III M.D.   On: 03/02/2016 17:43    ECG & Cardiac Imaging    RSR, 99, no acute st/t changes.  Assessment & Plan    1.  Midsternal chest pain:  Pt presented to the ED on 1/15 with a 13 hr h/o of headache assoc w/ n/v and a 2+ hr h/o chest pain assoc w/ LH and dyspnea.  ECG in ED was non-acute, rate 99.  Trop nl and she has subsequently r/o.  D dimer nl.  Ntg seemed to help, though Ss persisted for several hours afterward - total duration > 12 hrs.  Despite prolonged duration of c/p, trop remains nl.  Chest pain free this am.  Echo pending.  If echo shows nl EF w/o wma, would plan d/c with outpt 2 day lexiscan myoview.  ? Role of ASA overdose in symptoms - ? Gastritis - 7 excedrin tabletes = 1750 mg of ASA.  Educated pt re: this.  PPI added.  2.  HTN:  bp stable.  On no meds 2 home.  Consider adding acei given h/o dm.  3.  DM II: dx in  2016.  A1c 7.0 in September.  Per IM.  Signed, Murray Hodgkins, NP 03/03/2016, 12:24 PM  The patient was seen, examined and discussed with Carmela Rima, NP and agree as above.  46 year old female with h/o obesity, DM, HTN, HLP with significant family history of premature CAD who resented with an atypical retrosternal chest pain post migraine episode and high dose of Aspirin intake. The pain started as burning and evolved into pressure. Now asymptomatic. Troponin negative x3, echocardiogram showed hyperdynamic LVEF, no regional wall motion abnormalities, ECG normal. We will discharge and plan for an outpatient coronary CTA.   Ena Dawley, MD 03/03/2016

## 2016-03-03 NOTE — ED Notes (Signed)
Attempted report x 1; name and call back number provided 

## 2016-03-03 NOTE — Progress Notes (Signed)
  Echocardiogram 2D Echocardiogram has been performed.  Myley Bahner L Androw 03/03/2016, 2:37 PM

## 2016-03-03 NOTE — Progress Notes (Signed)
  Echocardiogram 2D Echocardiogram has been performed.  Carrie Neal 03/03/2016, 2:35 PM

## 2016-03-03 NOTE — Care Management Note (Signed)
Case Management Note  Patient Details  Name: Carrie Neal MRN: [id number] Date of Birth: [birth year]  Subjective/Objective:  Presents with chest pain, trops negative, was on nitro drip, is for dc home today.                    Action/Plan:   Expected Discharge Date:  03/03/16               Expected Discharge Plan:  Home/Self Care  In-House Referral:     Discharge planning Services  CM Consult  Post Acute Care Choice:    Choice offered to:     DME Arranged:    DME Agency:     HH Arranged:    HH Agency:     Status of Service:  Completed, signed off  If discussed at [employer] of Stay Meetings, dates discussed:    Additional Comments:  Zenon Mayo, RN 03/03/2016, 3:27 PM

## 2016-03-17 ENCOUNTER — Telehealth: Payer: Self-pay | Admitting: Cardiology

## 2016-03-17 NOTE — Telephone Encounter (Signed)
-----   Message from Rogelia Mire, NP sent at 03/03/2016  3:01 PM EST ----- Carrie Neal,  This is the pt that needs the cardiac ct.  Also will need f/u with [place] Siren or APP on [facility]'s team after CT.  Thanks,  Gerald Stabs

## 2016-03-17 NOTE — Telephone Encounter (Signed)
Have left message on 1-19, 1-22. 1-25 and 1-26 to call and schedule cardiac ct.  On today spoke with patient for a few minutes and she stated she would have to call be back to schedule because she was in the middle of something.

## 2016-03-25 ENCOUNTER — Encounter: Payer: Self-pay | Admitting: Cardiology

## 2016-03-25 NOTE — Telephone Encounter (Signed)
[facility] has made multiple attempts at trying to make contact with this pt to schedule her coronary ct and follow-up appt, per Ignacia Bayley NP.  Pt has not returned a call back. Per CT Scheduler, they will generate a letter to mail to this pt, to have her call back to arrange her ordered coronary ct.  Per Scheduler, this will be the last attempt at trying to make contact with this pt to schedule her study.

## 2016-03-31 NOTE — Telephone Encounter (Signed)
Patient Messages       03-06-16 L[facility]om to call and schedule Cardiac Ct appointment/saf   03-09-16 [facility] to call and schedule Cardiac Ct/saf   03-12-16 [facility] to call and schedule cardiac ct/saf   03-13-16 [facility]om to call to schedule cardic ct/saf   03-17-16 sPOKE WITH PATIENT /SAID SHE WILL CALL BACK TO [place] IN 15MIN/SAF

## 2016-03-31 NOTE — Telephone Encounter (Signed)
Scheduling and myself have attempted to call this pt multiple times to schedule her coronary ct and a post hospital follow-up appt with Dr Meda Coffee or an [place] on her team.  This was originally ordered by Ignacia Bayley NP.   Pt informed [facility] scheduler Mack Guise, that she will call back when she decides to schedule her appts.    Scheduler also mailed her a letter to follow-up.  Scheduler and myself have been trying to set up needed appts for this  pt since 03/06/16.   Pt has yet to call us back or respond to follow-up letter sent by [facility].   Will route this message to Ignacia Bayley NP, as an Juluis Rainier, to inform him of pts non-compliance.

## 2016-04-15 ENCOUNTER — Ambulatory Visit (INDEPENDENT_AMBULATORY_CARE_PROVIDER_SITE_OTHER): Payer: BLUE CROSS/BLUE SHIELD | Admitting: Family

## 2016-04-15 ENCOUNTER — Encounter: Payer: Self-pay | Admitting: Family

## 2016-04-15 ENCOUNTER — Other Ambulatory Visit (INDEPENDENT_AMBULATORY_CARE_PROVIDER_SITE_OTHER): Payer: BLUE CROSS/BLUE SHIELD

## 2016-04-15 VITALS — BP 128/86 | HR 93 | Temp 98.4°F | Resp 16 | Ht 67.0 in | Wt 267.0 lb

## 2016-04-15 DIAGNOSIS — E11319 Type 2 diabetes mellitus with unspecified diabetic retinopathy without macular edema: Secondary | ICD-10-CM | POA: Diagnosis not present

## 2016-04-15 LAB — MICROALBUMIN / CREATININE URINE RATIO
Creatinine,U: 70.9 mg/dL
MICROALB/CREAT RATIO: 3 mg/g (ref 0.0–30.0)
Microalb, Ur: 2.1 mg/dL — ABNORMAL HIGH (ref 0.0–1.9)

## 2016-04-15 LAB — HEMOGLOBIN A1C: HEMOGLOBIN A1C: 6.3 % (ref 4.6–6.5)

## 2016-04-15 MED ORDER — METFORMIN HCL ER 500 MG PO TB24: 500.0000 mg | ORAL_TABLET | Freq: Every day | ORAL | 0 refills | Status: DC

## 2016-04-15 NOTE — Progress Notes (Signed)
Subjective:    Patient ID: Carrie Neal, female    DOB: [birth year], 46 y.o.   MRN: [id number]  Chief Complaint  Patient presents with  . Medication follow up    needs another diabetic medication, insurance will no longer cover trulicity    HPI:  Carrie Neal is a 46 y.o. female who  has a past medical history of Anemia; Asthma; Chicken pox; Diastolic dysfunction; Dysfunctional uterine bleeding; Hypertension; Kidney stones; Morbid obesity ([place]); and Type 2 diabetes mellitus ([place]). and presents today for an office visit.   Diabetes - Currently maintained on Trulicity. She has not been able to take her medication in about 2 months secondary to change in insurance coverage. Blood sugars have been averaging around 100 and has restarted taking glipizide. Denies adverse side effects or hypoglycemic readings. Working on decreasing her carbohydrate intake. Physical activity is limited and working on improving it.    Lab Results  Component Value Date   HGBA1C 7.0 (H) 11/07/2015     Allergies  Allergen Reactions  . Codeine Itching      Outpatient Medications Prior to Visit  Medication Sig Dispense Refill  . aspirin EC 81 MG tablet Take 1 tablet (81 mg total) by mouth daily.    [name] Kitchen gabapentin (NEURONTIN) 300 MG capsule Take 1 capsule by mouth for 1 day then increase to 1 capsule by mouth twice daily x 1 day then increase to 1 capsule by mouth 3x times daily (Patient taking differently: Take 300 mg by mouth 2 (two) times daily as needed (leg pain). ) 90 capsule 0  . pantoprazole (PROTONIX) 40 MG tablet Take 1 tablet (40 mg total) by mouth daily at 6 (six) AM. 30 tablet 0  . Dulaglutide (TRULICITY) [id number] 0000000 SOPN Inject 0.75 mg into the skin once a week. (Patient not taking: Reported on 04/15/2016) 6 pen 2  . ferrous sulfate (FERROUSUL) 325 (65 FE) MG tablet Take 1 tablet (325 mg total) by mouth daily with breakfast. (Patient not taking: Reported on 03/02/2016) 30 tablet 1   No  facility-administered medications prior to visit.       Review of Systems  Eyes:       Denies changes in vision  Respiratory: Negative for chest tightness and shortness of breath.   Cardiovascular: Negative for chest pain, palpitations and leg swelling.  Endocrine: Negative for polydipsia, polyphagia and polyuria.  Neurological: Negative for dizziness, weakness and numbness.      Objective:    BP 128/86 (BP Location: Left Arm, Patient Position: Sitting, Cuff Size: Large)   Pulse 93   Temp 98.4 F (36.9 C) (Oral)   Resp 16   Ht 5\' 7"  (1.702 m)   Wt 267 lb (121.1 kg)   LMP  (LMP Unknown) Comment: AUB  SpO2 98%   BMI 41.82 kg/m  Nursing note and vital signs reviewed.  Physical Exam  Constitutional: She is oriented to person, place, and time. She appears well-developed and well-nourished. No distress.  Cardiovascular: Normal rate, regular rhythm, normal heart sounds and intact distal pulses.   Pulmonary/Chest: Effort normal and breath sounds normal.  Neurological: She is alert and oriented to person, place, and time.  Skin: Skin is warm and dry.  Psychiatric: She has a normal mood and affect. Her behavior is normal. Judgment and thought content normal.       Assessment & Plan:   Problem List Items Addressed This Visit      Endocrine   Type 2 diabetes, controlled, with  retinopathy ([place]) - Primary    Obtain hemoglobin A1c and urine microalbumin. No longer on Trulicity secondary to inability to afford medication. She has been taking an old prescription of glipizide with home blood sugars appearing stable. She wishes to start trial metformin. Start metformin. Due for diabetic eye exam encouraged to be completed independently. Pneumovax and foot exam are up-to-date. Continue to monitor blood sugars at home and follow low carbohydrate diet while increasing physical activity. Follow-up in 3 months or sooner.      Relevant Medications   metFORMIN (GLUCOPHAGE XR) 500 MG 24 hr  tablet   Other Relevant Orders   Hemoglobin A1c   Urine Microalbumin w/creat. ratio       I have discontinued [name]'s ferrous sulfate and Dulaglutide. I am also having her start on metFORMIN. Additionally, I am having her maintain her gabapentin, pantoprazole, and aspirin EC.   Meds ordered this encounter  Medications  . metFORMIN (GLUCOPHAGE XR) 500 MG 24 hr tablet    Sig: Take 1 tablet (500 mg total) by mouth daily with breakfast.    Dispense:  30 tablet    Refill:  0    Order Specific Question:   Supervising Provider    Answer:   Pricilla Holm A [id number]     Follow-up: Return in about 3 months (around 07/13/2016), or if symptoms worsen or fail to improve.  Mauricio Po, FNP

## 2016-04-15 NOTE — Patient Instructions (Addendum)
Thank you for choosing [facility].  SUMMARY AND INSTRUCTIONS:  Please check your drug formulary for the following medications: Farxiga, Invokana, Jardiance, B-cise (Bydureon)   Start metformin XR. It may cause upset stomach or loose stools over the 1st week which usually resolves.  Continue to work on nutrition and physical activity.   Medication:   Your prescription(s) have been submitted to your pharmacy or been printed and provided for you. Please take as directed and contact our office if you believe you are having problem(s) with the medication(s) or have any questions.  Labs:  Please stop by the lab on the lower level of the building for your blood work. Your results will be released to [place] (or called to you) after review, usually within 72 hours after test completion. If any changes need to be made, you will be notified at that same time.  1.) The lab is open from 7:30am to 5:30 pm Monday-Friday 2.) No appointment is necessary 3.) Fasting (if needed) is 6-8 hours after food and drink; black coffee and water are okay    Follow up:  If your symptoms worsen or fail to improve, please contact our office for further instruction, or in case of emergency go directly to the emergency room at the closest medical facility.

## 2016-04-15 NOTE — Assessment & Plan Note (Signed)
Obtain hemoglobin A1c and urine microalbumin. No longer on Trulicity secondary to inability to afford medication. She has been taking an old prescription of glipizide with home blood sugars appearing stable. She wishes to start trial metformin. Start metformin. Due for diabetic eye exam encouraged to be completed independently. Pneumovax and foot exam are up-to-date. Continue to monitor blood sugars at home and follow low carbohydrate diet while increasing physical activity. Follow-up in 3 months or sooner.

## 2016-05-12 ENCOUNTER — Other Ambulatory Visit: Payer: Self-pay | Admitting: Family

## 2016-07-17 ENCOUNTER — Telehealth: Payer: Self-pay | Admitting: Family

## 2016-07-17 MED ORDER — FLUCONAZOLE 150 MG PO TABS: 150.0000 mg | ORAL_TABLET | Freq: Once | ORAL | 0 refills | Status: AC

## 2016-07-17 NOTE — Telephone Encounter (Signed)
Pt called in and said that she a yeast infection and soul like Carrie Neal to call in 2 pills.  She said that 1 never works  Soap broke her out and now she has a Engineer, technical sales number [phone]

## 2016-07-17 NOTE — Telephone Encounter (Signed)
Rx sent. LVM letting her know.

## 2016-08-04 ENCOUNTER — Telehealth: Payer: Self-pay | Admitting: Family

## 2016-08-04 NOTE — Telephone Encounter (Signed)
Pt called in and said that she feels like she needs to up her metformin.  She said that her sugar is high.  Explain to her that she needs to make an appt but did not want to.  She thinks she needs to up her meds.

## 2016-08-04 NOTE — Telephone Encounter (Signed)
What have her blood sugars been?

## 2016-08-05 ENCOUNTER — Encounter (HOSPITAL_COMMUNITY): Payer: Self-pay | Admitting: Emergency Medicine

## 2016-08-05 ENCOUNTER — Emergency Department (HOSPITAL_COMMUNITY)
Admission: EM | Admit: 2016-08-05 | Discharge: 2016-08-05 | Disposition: A | Discharge status: Home / Self Care | Payer: BLUE CROSS/BLUE SHIELD | Attending: Physician Assistant | Admitting: Physician Assistant

## 2016-08-05 DIAGNOSIS — J45909 Unspecified asthma, uncomplicated: Secondary | ICD-10-CM | POA: Insufficient documentation

## 2016-08-05 DIAGNOSIS — Z79899 Other long term (current) drug therapy: Secondary | ICD-10-CM | POA: Insufficient documentation

## 2016-08-05 DIAGNOSIS — B3731 Acute candidiasis of vulva and vagina: Secondary | ICD-10-CM

## 2016-08-05 DIAGNOSIS — R739 Hyperglycemia, unspecified: Secondary | ICD-10-CM

## 2016-08-05 DIAGNOSIS — B373 Candidiasis of vulva and vagina: Secondary | ICD-10-CM | POA: Insufficient documentation

## 2016-08-05 DIAGNOSIS — I10 Essential (primary) hypertension: Secondary | ICD-10-CM | POA: Insufficient documentation

## 2016-08-05 DIAGNOSIS — Z87891 Personal history of nicotine dependence: Secondary | ICD-10-CM | POA: Insufficient documentation

## 2016-08-05 DIAGNOSIS — E1165 Type 2 diabetes mellitus with hyperglycemia: Secondary | ICD-10-CM | POA: Insufficient documentation

## 2016-08-05 DIAGNOSIS — Z7984 Long term (current) use of oral hypoglycemic drugs: Secondary | ICD-10-CM | POA: Insufficient documentation

## 2016-08-05 LAB — URINALYSIS, ROUTINE W REFLEX MICROSCOPIC
BACTERIA UA: NONE SEEN
Bilirubin Urine: NEGATIVE
Glucose, UA: >=500 mg/dL — AB
Hgb urine dipstick: NEGATIVE
Ketones, ur: 20 mg/dL — AB
LEUKOCYTES UA: NEGATIVE
Nitrite: NEGATIVE
PH: 6 (ref 5.0–8.0)
Protein, ur: NEGATIVE mg/dL
Specific Gravity, Urine: 1.029 (ref 1.005–1.030)

## 2016-08-05 LAB — CBC
HEMATOCRIT: 39.4 % (ref 36.0–46.0)
HEMOGLOBIN: 13.1 g/dL (ref 12.0–15.0)
MCH: 27.2 pg (ref 26.0–34.0)
MCHC: 33.2 g/dL (ref 30.0–36.0)
MCV: 81.7 fL (ref 78.0–100.0)
Platelets: 268 10*3/uL (ref 150–400)
RBC: 4.82 MIL/uL (ref 3.87–5.11)
RDW: 14.4 % (ref 11.5–15.5)
WBC: 5 10*3/uL (ref 4.0–10.5)

## 2016-08-05 LAB — BASIC METABOLIC PANEL
ANION GAP: 9 (ref 5–15)
BUN: 7 mg/dL (ref 6–20)
CO2: 21 mmol/L — ABNORMAL LOW (ref 22–32)
Calcium: 11.1 mg/dL — ABNORMAL HIGH (ref 8.9–10.3)
Chloride: 102 mmol/L (ref 101–111)
Creatinine, Ser: 0.69 mg/dL (ref 0.44–1.00)
GLUCOSE: 470 mg/dL — AB (ref 65–99)
POTASSIUM: 4.3 mmol/L (ref 3.5–5.1)
SODIUM: 132 mmol/L — AB (ref 135–145)

## 2016-08-05 LAB — CBG MONITORING, ED
GLUCOSE-CAPILLARY: 462 mg/dL — AB (ref 65–99)
GLUCOSE-CAPILLARY: 370 mg/dL — AB (ref 65–99)

## 2016-08-05 MED ORDER — METFORMIN HCL 500 MG PO TABS: 500.0000 mg | ORAL_TABLET | Freq: Two times a day (BID) | ORAL | 0 refills | Status: DC

## 2016-08-05 MED ORDER — SODIUM CHLORIDE 0.9 % IV BOLUS (SEPSIS)
1000.0000 mL | Freq: Once | INTRAVENOUS | Status: AC
Start: 2016-08-05 — End: 2016-08-05
  Administered 2016-08-05: 1000 mL via INTRAVENOUS

## 2016-08-05 MED ORDER — SODIUM CHLORIDE 0.9 % IV BOLUS (SEPSIS)
1000.0000 mL | Freq: Once | INTRAVENOUS | Status: AC
  Administered 2016-08-05: 1000 mL via INTRAVENOUS

## 2016-08-05 MED ORDER — FLUCONAZOLE 100 MG PO TABS
150.0000 mg | ORAL_TABLET | Freq: Once | ORAL | Status: AC
  Administered 2016-08-05: 150 mg via ORAL
  Filled 2016-08-05: qty 2

## 2016-08-05 NOTE — ED Notes (Signed)
ED Provider at bedside. 

## 2016-08-05 NOTE — ED Provider Notes (Signed)
[facility] DEPT Provider Note   CSN: [id number] Arrival date & time: 08/05/16  1925     History   Chief Complaint Chief Complaint  Patient presents with  . Hyperglycemia    HPI Carrie Neal is a 46 y.o. female presenting with a high blood glucose reading at home. She explains that she did not have her glucometer for a couple days and felt a little bit tired, Thursday and urinated more than usual. She finally got her glucometer back today and had a very high reading. Her PCP office who recommended that she come to the emergency department. She also reports some palpitations. She denies any abdominal pain, or other symptoms. She currently takes metformin 500 mg per day.  HPI  Past Medical History:  Diagnosis Date  . Anemia   . Asthma    no inhaler  . Chicken pox   . Diastolic dysfunction    a. 03/2014 Echo: EF 65-70%, gr1 DD, no rwma.  . Dysfunctional uterine bleeding    a. 11/2015 s/p partial hysterectomy.  . Hypertension   . Kidney stones   . Morbid obesity ([place])   . Type 2 diabetes mellitus ([place])    a. Dx 2016.    Patient Active Problem List   Diagnosis Date Noted  . Chest pain 03/02/2016  . Status post vaginal hysterectomy 11/18/2015  . Pre-operative clearance 11/07/2015  . Seasonal allergies 05/21/2014  . Type 2 diabetes, controlled, with retinopathy ([place]) 04/27/2014  . DKA (diabetic ketoacidoses) ([place]) 04/13/2014  . Tachycardia 04/13/2014  . Hypercalcemia 04/13/2014  . DUB (dysfunctional uterine bleeding) 03/22/2014  . Anemia 03/22/2014  . Severe anemia 03/22/2014    Past Surgical History:  Procedure Laterality Date  . ABDOMINAL HYSTERECTOMY    . CHOLECYSTECTOMY    . CYSTOSCOPY N/A 11/18/2015   Procedure: CYSTOSCOPY;  Surgeon: Waymon Amato, MD;  Location: [facility];  Service: Gynecology;  Laterality: N/A;  . DILATATION & CURETTAGE/HYSTEROSCOPY WITH MYOSURE N/A 03/30/2014   Procedure: DILATATION & CURETTAGE/HYSTEROSCOPY WITH MYOSURE and placement of Mirena  Intrauterine device;  Surgeon: Alinda Dooms, MD;  Location: [facility];  Service: Gynecology;  Laterality: N/A;  . HERNIA REPAIR    . TUBAL LIGATION    . UMBILICAL HERNIA REPAIR     as child  . VAGINAL HYSTERECTOMY Bilateral 11/18/2015   Procedure: HYSTERECTOMY VAGINAL Rightl Salpingectomy;  Surgeon: Waymon Amato, MD;  Location: [facility];  Service: Gynecology;  Laterality: Bilateral;    OB History    Gravida Para Term Preterm AB Living   6 6 6    0 6   SAB TAB Ectopic Multiple Live Births   0               Home Medications    Prior to Admission medications   Medication Sig Start Date End Date Taking? Authorizing Provider  gabapentin (NEURONTIN) 300 MG capsule Take 1 capsule by mouth for 1 day then increase to 1 capsule by mouth twice daily x 1 day then increase to 1 capsule by mouth 3x times daily Patient taking differently: Take 300 mg by mouth 2 (two) times daily as needed (leg pain).  08/15/15  Yes Golden Circle, FNP  vitamin B-12 (CYANOCOBALAMIN) 1000 MCG tablet Take 500 mcg by mouth daily.   Yes [provider]  metFORMIN (GLUCOPHAGE) 500 MG tablet Take 1 tablet (500 mg total) by mouth 2 (two) times daily with a meal. 08/05/16   Emeline General, PA-C    Family History Family History  Problem Relation Age of Onset  . Diabetes Mother   . Diabetes Cousin   . Diabetes Maternal Grandmother     Social History Social History  Substance Use Topics  . Smoking status: Former Smoker    Years: 4.00    Types: Cigarettes  . Smokeless tobacco: Never Used  . Alcohol use No     Allergies   Codeine   Review of Systems Review of Systems  Constitutional: Positive for fatigue. Negative for chills, diaphoresis and fever.  HENT: Negative for ear pain and sore throat.   Eyes: Negative for pain and visual disturbance.  Respiratory: Negative for cough, shortness of breath, wheezing and stridor.   Cardiovascular: Negative for chest pain, palpitations and leg swelling.    Gastrointestinal: Negative for abdominal distention, abdominal pain, diarrhea, nausea and vomiting.  Endocrine: Positive for polydipsia and polyuria.  Genitourinary: Positive for frequency. Negative for difficulty urinating, dysuria, flank pain and hematuria.  Musculoskeletal: Negative for arthralgias, back pain, gait problem, joint swelling, myalgias, neck pain and neck stiffness.  Skin: Negative for color change, pallor and rash.  Neurological: Negative for dizziness, tremors, seizures, syncope, facial asymmetry, speech difficulty, weakness, light-headedness, numbness and headaches.     Physical Exam Updated Vital Signs BP 129/84   Pulse 92   Temp 98.2 F (36.8 C) (Oral)   Resp 16   Ht 5\' 7"  (1.702 m)   Wt 116.1 kg (256 lb)   LMP  (LMP Unknown) Comment: AUB  SpO2 100%   BMI 40.10 kg/m   Physical Exam  Constitutional: She appears well-developed and well-nourished. No distress.  Afebrile, nontoxic-appearing, sitting comfortably in bed in no acute distress.  HENT:  Head: Normocephalic and atraumatic.  Eyes: EOM are normal.  Neck: Normal range of motion.  Cardiovascular: Normal rate, regular rhythm, normal heart sounds and intact distal pulses.   No murmur heard. Pulmonary/Chest: Effort normal and breath sounds normal. No respiratory distress. She has no wheezes. She has no rales.  Abdominal: Soft. She exhibits no distension and no mass. There is no tenderness. There is no guarding.  Musculoskeletal: Normal range of motion. She exhibits no edema.  Neurological: She is alert.  Skin: Skin is warm and dry. She is not diaphoretic. No erythema. No pallor.  Psychiatric: She has a normal mood and affect.  Nursing note and vitals reviewed.    ED Treatments / Results  Labs (all labs ordered are listed, but only abnormal results are displayed) Labs Reviewed  BASIC METABOLIC PANEL - Abnormal; Notable for the following:       Result Value   Sodium 132 (*)    CO2 21 (*)     Glucose, Bld 470 (*)    Calcium 11.1 (*)    All other components within normal limits  URINALYSIS, ROUTINE W REFLEX MICROSCOPIC - Abnormal; Notable for the following:    Color, Urine STRAW (*)    Glucose, UA >=500 (*)    Ketones, ur 20 (*)    Squamous Epithelial / LPF 0-5 (*)    All other components within normal limits  CBG MONITORING, ED - Abnormal; Notable for the following:    Glucose-Capillary 462 (*)    All other components within normal limits  CBG MONITORING, ED - Abnormal; Notable for the following:    Glucose-Capillary 370 (*)    All other components within normal limits  CBC    EKG  EKG Interpretation None       Radiology No results found.  Procedures Procedures (  including critical care time)  Medications Ordered in ED Medications  sodium chloride 0.9 % bolus 1,000 mL (0 mLs Intravenous Stopped 08/05/16 2257)  sodium chloride 0.9 % bolus 1,000 mL (0 mLs Intravenous Stopped 08/05/16 2309)  fluconazole (DIFLUCAN) tablet 150 mg (150 mg Oral Given 08/05/16 2254)     Initial Impression / Assessment and Plan / ED Course  I have reviewed the triage vital signs and the nursing notes.  Pertinent labs & imaging results that were available during my care of the patient were reviewed by me and considered in my medical decision making (see chart for details).    Patient presents with Hyperglycemia, polyuria and polydipsia. She also reported slight palpitations and fatigue. Ordered EKG. She is currently on 500 mg a day of metformin. Last PCP visit was about 4 months ago and A1c was normal per patient.   No anion gap, some ketones in the urine. Will give IV fluids and reassess On reassessment after fluid bolus, blood glucose was stabilizing.  She also reported symptoms of yeast infection including pruritus and states that she has had this in the past with elevated blood glucose. Patient given one dose diflucan 150 mg in ED.  Examine labs otherwise unremarkable. Patient  was stable and well-appearing sitting comfortably in bed. Will send patient home with metformin 500mg  BID and close follow-up with PCP.  Discussed strict return precautions and advised to return to the emergency department if experiencing any new or worsening symptoms. Instructions were understood and patient agreed with discharge plan. Final Clinical Impressions(s) / ED Diagnoses   Final diagnoses:  Hyperglycemia  Yeast vaginitis    New Prescriptions Discharge Medication List as of 08/05/2016 11:13 PM    START taking these medications   Details  metFORMIN (GLUCOPHAGE) 500 MG tablet Take 1 tablet (500 mg total) by mouth 2 (two) times daily with a meal., Starting Wed 08/05/2016, Print         Emeline General, PA-C 08/06/16 0025    Macarthur Critchley, MD 08/07/16 [phone]

## 2016-08-05 NOTE — ED Notes (Signed)
EDP aware of pt CBG

## 2016-08-05 NOTE — Discharge Instructions (Signed)
As discussed, avoid sugary drinks such as [employer] or sodas. Stay well-hydrated. Follow the instructions in this discharge summary regarding nutrition with diabetes.  Take 500 mg metformin twice daily and follow up with your primary care provider to discuss further management and A1c recheck. Return to the ER if symptoms worsen in the meantime.

## 2016-08-05 NOTE — ED Triage Notes (Signed)
Reports sugar has been high since Monday.  C/o feeling tired with polydipsia and polyuria.

## 2016-08-05 NOTE — Telephone Encounter (Signed)
Patient states her sugars have been around 500, she decided to go ahead and take 2 metformins, after she has been doing that, her sugar readings now are around 280's, so it is bringing it down, but she is going to run out of pills soon---please advise, I will call patient back, thanks

## 2016-08-06 NOTE — Telephone Encounter (Signed)
Refill provided by ED.

## 2016-08-20 ENCOUNTER — Telehealth: Payer: Self-pay | Admitting: *Deleted

## 2016-08-20 MED ORDER — METFORMIN HCL 1000 MG PO TABS: 1000.0000 mg | ORAL_TABLET | Freq: Two times a day (BID) | ORAL | 0 refills | Status: DC

## 2016-08-20 NOTE — Telephone Encounter (Signed)
New prescription sent

## 2016-08-20 NOTE — Telephone Encounter (Signed)
Notified pt w/MD response.../lmb 

## 2016-08-20 NOTE — Telephone Encounter (Signed)
Pt left msg on triage stating the hospital increase her metformin to 1000 mg twice a day. She is needing updated script w/correct sig and quantity called into her [facility] pharmacy...Johny Chess

## 2016-09-01 ENCOUNTER — Ambulatory Visit (INDEPENDENT_AMBULATORY_CARE_PROVIDER_SITE_OTHER): Payer: Self-pay | Admitting: Family

## 2016-09-01 ENCOUNTER — Encounter: Payer: Self-pay | Admitting: Family

## 2016-09-01 DIAGNOSIS — E119 Type 2 diabetes mellitus without complications: Secondary | ICD-10-CM

## 2016-09-01 MED ORDER — FLUCONAZOLE 150 MG PO TABS: ORAL_TABLET | ORAL | 0 refills | Status: DC

## 2016-09-01 MED ORDER — GLIPIZIDE ER 5 MG PO TB24: 5.0000 mg | ORAL_TABLET | Freq: Every day | ORAL | 2 refills | Status: DC

## 2016-09-01 NOTE — Progress Notes (Signed)
Subjective:    Patient ID: Carrie Neal, female    DOB: [birth year], 45 y.o.   MRN: [id number]  Chief Complaint  Patient presents with  . Diabetes    having high blood sugar readings    HPI:  Carrie Neal is a 46 y.o. female who  has a past medical history of Anemia; Asthma; Chicken pox; Diastolic dysfunction; Dysfunctional uterine bleeding; Hypertension; Kidney stones; Morbid obesity ([place]); and Type 2 diabetes mellitus ([place]). and presents today for a follow up office visit.   1.) Diabetes - Currently maintained on metformin. Reports taking the medication as prescribed and denies adverse side effects or hypoglycemic readings. Blood sugars at home have been in the 300's. Denies new symptoms of end organ damage. No excessive hunger, thirst, or urination. Working on a low/carbohydrate modified oral intake.    Lab Results  Component Value Date   HGBA1C 6.3 04/15/2016     Lab Results  Component Value Date   CREATININE 0.69 08/05/2016   BUN 7 08/05/2016   NA 132 (L) 08/05/2016   K 4.3 08/05/2016   CL 102 08/05/2016   CO2 21 (L) 08/05/2016       Allergies  Allergen Reactions  . Codeine Itching      Outpatient Medications Prior to Visit  Medication Sig Dispense Refill  . gabapentin (NEURONTIN) 300 MG capsule Take 1 capsule by mouth for 1 day then increase to 1 capsule by mouth twice daily x 1 day then increase to 1 capsule by mouth 3x times daily (Patient taking differently: Take 300 mg by mouth 2 (two) times daily as needed (leg pain). ) 90 capsule 0  . metFORMIN (GLUCOPHAGE) 1000 MG tablet Take 1 tablet (1,000 mg total) by mouth 2 (two) times daily with a meal. 180 tablet 0  . vitamin B-12 (CYANOCOBALAMIN) 1000 MCG tablet Take 500 mcg by mouth daily.     No facility-administered medications prior to visit.     Review of Systems  Constitutional: Negative for chills, fatigue and fever.  Eyes:       Denies changes in vision  Respiratory: Negative for chest tightness and  shortness of breath.   Cardiovascular: Negative for chest pain, palpitations and leg swelling.  Endocrine: Negative for polydipsia, polyphagia and polyuria.  Neurological: Negative for numbness.      Objective:    BP 114/72 (BP Location: Left Arm, Patient Position: Sitting, Cuff Size: Large)   Pulse 95   Temp 98.1 F (36.7 C) (Oral)   Resp 16   Ht 5\' 7"  (1.702 m)   Wt 253 lb 12.8 oz (115.1 kg)   LMP  (LMP Unknown) Comment: AUB  SpO2 97%   BMI 39.75 kg/m  Nursing note and vital signs reviewed.  Physical Exam  Constitutional: She is oriented to person, place, and time. She appears well-developed and well-nourished. No distress.  Cardiovascular: Normal rate, regular rhythm, normal heart sounds and intact distal pulses.  Exam reveals no gallop and no friction rub.   No murmur heard. Pulmonary/Chest: Effort normal and breath sounds normal. No respiratory distress. She has no wheezes. She has no rales. She exhibits no tenderness.  Neurological: She is alert and oriented to person, place, and time.  Skin: Skin is warm and dry.  Psychiatric: She has a normal mood and affect. Her behavior is normal. Judgment and thought content normal.       Assessment & Plan:   Problem List Items Addressed This Visit      Endocrine  Type 2 diabetes mellitus (HCC)    Type 2 diabetes poorly controlled with current medication regimen with blood sugars in the 300-500 range. Continue current dosage of metformin. Start glipizide extended release. Patient is financially challenged with difficulties affording medications. Encouraged to monitor blood sugars at home and continue following a low carbohydrate diet. Obtain A1c at next office visit. Not currently maintained on a statin or Ace/ARB. Diabetic foot exam is up-to-date.      Relevant Medications   glipiZIDE (GLUCOTROL XL) 5 MG 24 hr tablet       I am having Ms. Scaturro start on glipiZIDE and fluconazole. I am also having her maintain her gabapentin,  vitamin B-12, and metFORMIN.   Meds ordered this encounter  Medications  . DISCONTD: metFORMIN (GLUCOPHAGE-XR) 500 MG 24 hr tablet    Sig: Take 500 mg by mouth 2 (two) times daily.  [name] Kitchen glipiZIDE (GLUCOTROL XL) 5 MG 24 hr tablet    Sig: Take 1 tablet (5 mg total) by mouth daily with breakfast.    Dispense:  30 tablet    Refill:  2    Order Specific Question:   Supervising Provider    Answer:   Pricilla Holm A [[id number]]  . fluconazole (DIFLUCAN) 150 MG tablet    Sig: Take 1 tablet by mouth every 72 hours.    Dispense:  3 tablet    Refill:  0    Order Specific Question:   Supervising Provider    Answer:   Pricilla Holm A [[id number]]     Follow-up: Return in about 3 months (around 12/02/2016), or if symptoms worsen or fail to improve.  Mauricio Po, FNP

## 2016-09-01 NOTE — Patient Instructions (Signed)
Thank you for choosing [facility].  SUMMARY AND INSTRUCTIONS:  Continue current dosage of metformin.  Start Glipizide daily. Ensure you eat prior to taking medication.  Monitor your blood sugars at home.   Medication:  Your prescription(s) have been submitted to your pharmacy or been printed and provided for you. Please take as directed and contact our office if you believe you are having problem(s) with the medication(s) or have any questions.  Follow up:  If your symptoms worsen or fail to improve, please contact our office for further instruction, or in case of emergency go directly to the emergency room at the closest medical facility.

## 2016-09-01 NOTE — Assessment & Plan Note (Signed)
Type 2 diabetes poorly controlled with current medication regimen with blood sugars in the 300-500 range. Continue current dosage of metformin. Start glipizide extended release. Patient is financially challenged with difficulties affording medications. Encouraged to monitor blood sugars at home and continue following a low carbohydrate diet. Obtain A1c at next office visit. Not currently maintained on a statin or Ace/ARB. Diabetic foot exam is up-to-date.

## 2016-11-16 ENCOUNTER — Other Ambulatory Visit: Payer: Self-pay | Admitting: Family

## 2016-12-08 ENCOUNTER — Emergency Department (HOSPITAL_COMMUNITY): Payer: Self-pay

## 2016-12-08 ENCOUNTER — Emergency Department (HOSPITAL_COMMUNITY)
Admission: EM | Admit: 2016-12-08 | Discharge: 2016-12-08 | Disposition: A | Discharge status: Home / Self Care | Payer: Self-pay | Attending: Emergency Medicine | Admitting: Emergency Medicine

## 2016-12-08 ENCOUNTER — Encounter (HOSPITAL_COMMUNITY): Payer: Self-pay | Admitting: Emergency Medicine

## 2016-12-08 DIAGNOSIS — R0789 Other chest pain: Secondary | ICD-10-CM | POA: Insufficient documentation

## 2016-12-08 DIAGNOSIS — J45909 Unspecified asthma, uncomplicated: Secondary | ICD-10-CM | POA: Insufficient documentation

## 2016-12-08 DIAGNOSIS — Z79899 Other long term (current) drug therapy: Secondary | ICD-10-CM | POA: Insufficient documentation

## 2016-12-08 DIAGNOSIS — Z87891 Personal history of nicotine dependence: Secondary | ICD-10-CM | POA: Insufficient documentation

## 2016-12-08 DIAGNOSIS — E119 Type 2 diabetes mellitus without complications: Secondary | ICD-10-CM | POA: Insufficient documentation

## 2016-12-08 DIAGNOSIS — Z7984 Long term (current) use of oral hypoglycemic drugs: Secondary | ICD-10-CM | POA: Insufficient documentation

## 2016-12-08 LAB — CBC
HCT: 37.9 % (ref 36.0–46.0)
HEMOGLOBIN: 12.5 g/dL (ref 12.0–15.0)
MCH: 28.2 pg (ref 26.0–34.0)
MCHC: 33 g/dL (ref 30.0–36.0)
MCV: 85.6 fL (ref 78.0–100.0)
Platelets: 266 10*3/uL (ref 150–400)
RBC: 4.43 MIL/uL (ref 3.87–5.11)
RDW: 14.2 % (ref 11.5–15.5)
WBC: 5 10*3/uL (ref 4.0–10.5)

## 2016-12-08 LAB — BASIC METABOLIC PANEL
ANION GAP: 6 (ref 5–15)
BUN: 7 mg/dL (ref 6–20)
CHLORIDE: 107 mmol/L (ref 101–111)
CO2: 22 mmol/L (ref 22–32)
CREATININE: 0.64 mg/dL (ref 0.44–1.00)
Calcium: 10.7 mg/dL — ABNORMAL HIGH (ref 8.9–10.3)
GFR calc non Af Amer: >60 mL/min (ref >60)
Glucose, Bld: 103 mg/dL — ABNORMAL HIGH (ref 65–99)
Potassium: 4.3 mmol/L (ref 3.5–5.1)
Sodium: 135 mmol/L (ref 135–145)

## 2016-12-08 LAB — I-STAT TROPONIN, ED
Troponin i, poc: 0 ng/mL (ref 0.00–0.08)
Troponin i, poc: 0 ng/mL (ref 0.00–0.08)

## 2016-12-08 NOTE — Discharge Instructions (Signed)
Take 4 over the counter ibuprofen tablets 3 times a day or 2 over-the-counter naproxen tablets twice a day for pain. Also take tylenol 1000mg(2 extra strength) four times a day.    

## 2016-12-08 NOTE — ED Triage Notes (Signed)
Pt to ER for evaluation of 2 weeks of central chest pain, states worse with deep inspiration. Denies pain with movement. VSS. NAD.

## 2016-12-08 NOTE — ED Notes (Signed)
No answer x1 per [facility]

## 2016-12-08 NOTE — ED Provider Notes (Signed)
[facility] EMERGENCY DEPARTMENT Provider Note   CSN: [id number] Arrival date & time: 12/08/16  1549     History   Chief Complaint No chief complaint on file.   HPI Meleah Guadron is a 46 y.o. female.  46 yo F with a chief complaint of chest pain.  This been going on for the past couple weeks.  Pretty persistent today and so came to the ED.  Describes it as a pressure to the anterior aspect of her chest.  Denies radiation.  Some mild shortness of breath with it.  Denies nausea vomiting or diaphoresis.  Denies prior history of MI.  Denies history of PE or DVT.  Denies hemoptysis denies lower extremity edema denies history of cancer denies estrogen use.   The history is provided by the patient.  Chest Pain   This is a new problem. The current episode started 2 days ago. The problem occurs constantly. The problem has not changed since onset.The pain is present in the substernal region. The pain is at a severity of 7/10. The pain is moderate. The quality of the pain is described as brief. The pain does not radiate. Duration of episode(s) is 2 weeks. Associated symptoms include shortness of breath. Pertinent negatives include no dizziness, no fever, no headaches, no nausea, no palpitations and no vomiting. She has tried nothing for the symptoms. The treatment provided no relief.  Her past medical history is significant for diabetes.  Pertinent negatives for past medical history include no DVT, no hyperlipidemia, no hypertension, no MI and no PE.    Past Medical History:  Diagnosis Date  . Anemia   . Asthma    no inhaler  . Chicken pox   . Diastolic dysfunction    a. 03/2014 Echo: EF 65-70%, gr1 DD, no rwma.  . Dysfunctional uterine bleeding    a. 11/2015 s/p partial hysterectomy.  . Hypertension   . Kidney stones   . Morbid obesity ([place])   . Type 2 diabetes mellitus ([place])    a. Dx 2016.    Patient Active Problem List   Diagnosis Date Noted  . Chest pain  03/02/2016  . Status post vaginal hysterectomy 11/18/2015  . Pre-operative clearance 11/07/2015  . Seasonal allergies 05/21/2014  . Type 2 diabetes mellitus ([place]) 04/27/2014  . DKA (diabetic ketoacidoses) ([place]) 04/13/2014  . Tachycardia 04/13/2014  . Hypercalcemia 04/13/2014  . DUB (dysfunctional uterine bleeding) 03/22/2014  . Anemia 03/22/2014  . Severe anemia 03/22/2014    Past Surgical History:  Procedure Laterality Date  . ABDOMINAL HYSTERECTOMY    . CHOLECYSTECTOMY    . CYSTOSCOPY N/A 11/18/2015   Procedure: CYSTOSCOPY;  Surgeon: Waymon Amato, MD;  Location: [facility];  Service: Gynecology;  Laterality: N/A;  . DILATATION & CURETTAGE/HYSTEROSCOPY WITH MYOSURE N/A 03/30/2014   Procedure: DILATATION & CURETTAGE/HYSTEROSCOPY WITH MYOSURE and placement of Mirena Intrauterine device;  Surgeon: Alinda Dooms, MD;  Location: [facility];  Service: Gynecology;  Laterality: N/A;  . HERNIA REPAIR    . TUBAL LIGATION    . UMBILICAL HERNIA REPAIR     as child  . VAGINAL HYSTERECTOMY Bilateral 11/18/2015   Procedure: HYSTERECTOMY VAGINAL Rightl Salpingectomy;  Surgeon: Waymon Amato, MD;  Location: [facility];  Service: Gynecology;  Laterality: Bilateral;    OB History    Gravida Para Term Preterm AB Living   6 6 6    0 6   SAB TAB Ectopic Multiple Live Births   0  Home Medications    Prior to Admission medications   Medication Sig Start Date End Date Taking? Authorizing Provider  fluconazole (DIFLUCAN) 150 MG tablet Take 1 tablet by mouth every 72 hours. 09/01/16   Golden Circle, FNP  gabapentin (NEURONTIN) 300 MG capsule Take 1 capsule by mouth for 1 day then increase to 1 capsule by mouth twice daily x 1 day then increase to 1 capsule by mouth 3x times daily Patient taking differently: Take 300 mg by mouth 2 (two) times daily as needed (leg pain).  08/15/15   Golden Circle, FNP  glipiZIDE (GLUCOTROL XL) 5 MG 24 hr tablet Take 1 tablet (5 mg total) by mouth daily with  breakfast. 09/01/16   Golden Circle, FNP  metFORMIN (GLUCOPHAGE) 1000 MG tablet TAKE 1 TABLET (1,000 MG TOTAL) BY MOUTH 2 (TWO) TIMES DAILY WITH A MEAL. 11/16/16   Golden Circle, FNP  vitamin B-12 (CYANOCOBALAMIN) 1000 MCG tablet Take 500 mcg by mouth daily.    [provider]    Family History Family History  Problem Relation Age of Onset  . Diabetes Mother   . Diabetes Cousin   . Diabetes Maternal Grandmother     Social History Social History  Substance Use Topics  . Smoking status: Former Smoker    Years: 4.00    Types: Cigarettes  . Smokeless tobacco: Never Used  . Alcohol use No     Allergies   Codeine   Review of Systems Review of Systems  Constitutional: Negative for chills and fever.  HENT: Negative for congestion and rhinorrhea.   Eyes: Negative for redness and visual disturbance.  Respiratory: Positive for shortness of breath. Negative for wheezing.   Cardiovascular: Positive for chest pain. Negative for palpitations.  Gastrointestinal: Negative for nausea and vomiting.  Genitourinary: Negative for dysuria and urgency.  Musculoskeletal: Negative for arthralgias and myalgias.  Skin: Negative for pallor and wound.  Neurological: Negative for dizziness and headaches.     Physical Exam Updated Vital Signs BP 115/81   Pulse 90   Temp 98.3 F (36.8 C) (Oral)   Resp 18   LMP  (LMP Unknown) Comment: AUB  SpO2 100%   Physical Exam  Constitutional: She is oriented to person, place, and time. She appears well-developed and well-nourished. No distress.  HENT:  Head: Normocephalic and atraumatic.  Eyes: Pupils are equal, round, and reactive to light. EOM are normal.  Neck: Normal range of motion. Neck supple.  Cardiovascular: Normal rate and regular rhythm.  Exam reveals no gallop and no friction rub.   No murmur heard. Pulmonary/Chest: Effort normal. She has no wheezes. She has no rales. She exhibits tenderness (reproduces pain with palpation  about the sterum).  Abdominal: Soft. She exhibits no distension and no mass. There is no tenderness. There is no guarding.  Musculoskeletal: She exhibits no edema or tenderness.  Neurological: She is alert and oriented to person, place, and time.  Skin: Skin is warm and dry. She is not diaphoretic.  Psychiatric: She has a normal mood and affect. Her behavior is normal.  Nursing note and vitals reviewed.    ED Treatments / Results  Labs (all labs ordered are listed, but only abnormal results are displayed) Labs Reviewed  BASIC METABOLIC PANEL - Abnormal; Notable for the following:       Result Value   Glucose, Bld 103 (*)    Calcium 10.7 (*)    All other components within normal limits  CBC  I-STAT  TROPONIN, ED  I-STAT TROPONIN, ED    EKG  EKG Interpretation  Date/Time:  Tuesday December 08 2016 17:10:05 EDT Ventricular Rate:  96 PR Interval:  116 QRS Duration: 80 QT Interval:  338 QTC Calculation: 427 R Axis:   33 Text Interpretation:  Normal sinus rhythm Normal ECG No significant change since last tracing Confirmed by Deno Etienne [phone]) on 12/08/2016 9:46:21 PM       Radiology Dg Chest 2 View  Result Date: 12/08/2016 CLINICAL DATA:  Central chest pain for 2 weeks. EXAM: CHEST  2 VIEW COMPARISON:  PA and lateral chest 03/02/2016 and 01/05/2017. FINDINGS: Lungs clear. Heart size normal. No pneumothorax or pleural effusion. No acute bony abnormality. IMPRESSION: Negative chest. Electronically Signed   By: Inge Rise M.D.   On: 12/08/2016 17:47    Procedures Procedures (including critical care time)  Medications Ordered in ED Medications - No data to display   Initial Impression / Assessment and Plan / ED Course  I have reviewed the triage vital signs and the nursing notes.  Pertinent labs & imaging results that were available during my care of the patient were reviewed by me and considered in my medical decision making (see chart for details).     46 yo  F with a chief complaint of chest pain.  This is reproduced on exam.  Atypical in nature.  Delta troponin negative.  Discharge home.  11:52 PM:  I have discussed the diagnosis/risks/treatment options with the patient and believe the pt to be eligible for discharge home to follow-up with PCP. We also discussed returning to the ED immediately if new or worsening sx occur. We discussed the sx which are most concerning (e.g., sudden worsening pain, fever, inability to tolerate by mouth) that necessitate immediate return. Medications administered to the patient during their visit and any new prescriptions provided to the patient are listed below.  Medications given during this visit Medications - No data to display   The patient appears reasonably screen and/or stabilized for discharge and I doubt any other medical condition or other [facility] requiring further screening, evaluation, or treatment in the ED at this time prior to discharge.    Final Clinical Impressions(s) / ED Diagnoses   Final diagnoses:  Atypical chest pain    New Prescriptions New Prescriptions   No medications on file     Deno Etienne, DO 12/08/16 2352

## 2017-01-26 ENCOUNTER — Other Ambulatory Visit: Payer: Self-pay | Admitting: Family

## 2017-01-28 ENCOUNTER — Encounter: Payer: Self-pay | Admitting: Nurse Practitioner

## 2017-01-28 ENCOUNTER — Ambulatory Visit (INDEPENDENT_AMBULATORY_CARE_PROVIDER_SITE_OTHER): Payer: Self-pay | Admitting: Nurse Practitioner

## 2017-01-28 VITALS — BP 126/84 | HR 88 | Temp 98.0°F | Resp 18 | Ht 67.0 in | Wt 278.0 lb

## 2017-01-28 DIAGNOSIS — Z23 Encounter for immunization: Secondary | ICD-10-CM

## 2017-01-28 DIAGNOSIS — R635 Abnormal weight gain: Secondary | ICD-10-CM

## 2017-01-28 DIAGNOSIS — E119 Type 2 diabetes mellitus without complications: Secondary | ICD-10-CM

## 2017-01-28 LAB — POCT GLYCOSYLATED HEMOGLOBIN (HGB A1C): HEMOGLOBIN A1C: 6.1

## 2017-01-28 MED ORDER — METFORMIN HCL ER 500 MG PO TB24: 1000.0000 mg | ORAL_TABLET | Freq: Every day | ORAL | 1 refills | Status: DC

## 2017-01-28 MED ORDER — METFORMIN HCL ER 500 MG PO TB24: 1000.0000 mg | ORAL_TABLET | Freq: Every evening | ORAL | 1 refills | Status: DC

## 2017-01-28 NOTE — Progress Notes (Signed)
Subjective:    Patient ID: Carrie Neal, female    DOB: 10-24-1970, 46 y.o.   MRN: [id number]  HPI Carrie Neal is a 46 yo female who presents today to establish care.  She is transferring to me from another provider in the same clinic. She is requesting a change in her diabetes medications today.  Diabetes- She is currently maintained on metformin 1000 BID. She was started on glipizde in July for elevated blood sugars but she only took the glipizide for about one month. She stopped taking the glipizide because she thought it caused her to have low blood sugars. She reports her blood sugar readings at home have remained between 130's-150s on the twice daily metformin.  She is worried that the metformin is causing her to gain weight. She noticed that shes gained an increasing amount of weight since switching from metformin XL to metformin HCL. She began to notice the weight gain this summer and shes gained about 20 lbs since this summer. She has tried to work on diet and exercise, but says she is just too hungry to stop eating. She says she just wants to eat all day and she eats whatever she feels like.she has tried to cut back on sugary drinks and only drinks one 8-oz soda a day now, otherwise she drinks water. She does reports exercising on treadmill about three times a week. She said that she has talked to friends who were on januvia and she would really like to try that instead of her metformin.  She reports increased appetite. She denies heat or cold intolerance, hair loss, palpitations, abdominal pain, nausea, constipation, polyphagia, polyuria.  Lab Results  Component Value Date   HGBA1C 6.1 01/28/2017    Wt Readings from Last 3 Encounters:  01/28/17 278 lb (126.1 kg)  09/01/16 253 lb 12.8 oz (115.1 kg)  08/05/16 256 lb (116.1 kg)     Review of Systems  See HPI  Past Medical History:  Diagnosis Date  . Anemia   . Asthma    no inhaler  . Chicken pox   . Diastolic dysfunction    a.  03/2014 Echo: EF 65-70%, gr1 DD, no rwma.  . Dysfunctional uterine bleeding    a. 11/2015 s/p partial hysterectomy.  . Hypertension   . Kidney stones   . Morbid obesity ([place])   . Type 2 diabetes mellitus ([place])    a. Dx 2016.     Social History   Socioeconomic History  . Marital status: Married    Spouse name: Not on file  . Number of children: 6  . Years of education: 24  . Highest education level: Not on file  Social Needs  . Financial resource strain: Not on file  . Food insecurity - worry: Not on file  . Food insecurity - inability: Not on file  . Transportation needs - medical: Not on file  . Transportation needs - non-medical: Not on file  Occupational History  . Occupation: CNA  Tobacco Use  . Smoking status: Former Smoker    Years: 4.00    Types: Cigarettes  . Smokeless tobacco: Never Used  Substance and Sexual Activity  . Alcohol use: No  . Drug use: No  . Sexual activity: Yes    Birth control/protection: Surgical  Other Topics Concern  . Not on file  Social History Narrative   Born and raised in [place], [place]. Fun: shop   Denies religious beliefs effecting healthcare.    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   Does not routinely exercise.    Past Surgical History:  Procedure Laterality Date  . ABDOMINAL HYSTERECTOMY    . CHOLECYSTECTOMY    . CYSTOSCOPY N/A 11/18/2015   Procedure: CYSTOSCOPY;  Surgeon: Waymon Amato, MD;  Location: [facility];  Service: Gynecology;  Laterality: N/A;  . DILATATION & CURETTAGE/HYSTEROSCOPY WITH MYOSURE N/A 03/30/2014   Procedure: DILATATION & CURETTAGE/HYSTEROSCOPY WITH MYOSURE and placement of Mirena Intrauterine device;  Surgeon: Alinda Dooms, MD;  Location: [facility];  Service: Gynecology;  Laterality: N/A;  . HERNIA REPAIR    . TUBAL LIGATION    . UMBILICAL HERNIA REPAIR     as child  . VAGINAL HYSTERECTOMY Bilateral 11/18/2015   Procedure: HYSTERECTOMY VAGINAL Rightl Salpingectomy;  Surgeon: Waymon Amato, MD;  Location: [facility];  Service:  Gynecology;  Laterality: Bilateral;    Family History  Problem Relation Age of Onset  . Diabetes Mother   . Diabetes Cousin   . Diabetes Maternal Grandmother     Allergies  Allergen Reactions  . Codeine Itching    Current Outpatient Medications on File Prior to Visit  Medication Sig Dispense Refill  . fluconazole (DIFLUCAN) 150 MG tablet Take 1 tablet by mouth every 72 hours. 3 tablet 0  . gabapentin (NEURONTIN) 300 MG capsule Take 1 capsule by mouth for 1 day then increase to 1 capsule by mouth twice daily x 1 day then increase to 1 capsule by mouth 3x times daily (Patient taking differently: Take 300 mg by mouth 2 (two) times daily as needed (leg pain). ) 90 capsule 0  . glipiZIDE (GLUCOTROL XL) 5 MG 24 hr tablet Take 1 tablet (5 mg total) by mouth daily with breakfast. 30 tablet 2  . metFORMIN (GLUCOPHAGE) 1000 MG tablet TAKE 1 TABLET (1,000 MG TOTAL) BY MOUTH 2 (TWO) TIMES DAILY WITH A MEAL. 180 tablet 0  . vitamin B-12 (CYANOCOBALAMIN) 1000 MCG tablet Take 500 mcg by mouth daily.     No current facility-administered medications on file prior to visit.     BP 126/84 (BP Location: Left Arm, Patient Position: Sitting, Cuff Size: Large)   Pulse 88   Temp 98 F (36.7 C) (Oral)   Resp 18   Ht 5\' 7"  (1.702 m)   Wt 278 lb (126.1 kg)   LMP  (LMP Unknown) Comment: AUB  SpO2 98%   BMI 43.54 kg/m       Objective:   Physical Exam  Constitutional: She is oriented to person, place, and time. She appears well-developed and well-nourished. No distress.  Obese weight.  HENT:  Head: Normocephalic and atraumatic.  Cardiovascular: Normal rate, regular rhythm, normal heart sounds and intact distal pulses.  Pulmonary/Chest: Effort normal and breath sounds normal. No respiratory distress.  Musculoskeletal: She exhibits no edema.  Neurological: She is alert and oriented to person, place, and time. Coordination normal.  Skin: Skin is warm and dry.  Psychiatric: She has a normal mood and  affect. Judgment and thought content normal.  Vitals reviewed.   Diabetic Foot Exam - Simple   Simple Foot Form Visual Inspection No deformities, no ulcerations, no other skin breakdown bilaterally:  Yes Sensation Testing Intact to touch and monofilament testing bilaterally:  Yes Pulse Check Posterior Tibialis and Dorsalis pulse intact bilaterally:  Yes Comments       Assessment & Plan:  Need for influenza vaccination - Flu Vaccine QUAD 36+ mos IM  Weight gain- Likely related to patients diet although she blames it on  metformin- we discussed this. She declines referral to diabetes nutrition, she says she has been before. She declines TSH, would like to try changing her medications prior to additional lab work or testing. We spent several minutes discussing healthy diabetic diet including increased fresh vegetables, decreased meats, and limited or no sugary beverages or sweets. F/U in 1 month.

## 2017-01-28 NOTE — Assessment & Plan Note (Signed)
Diabetes appears controlled on metformin HCL 1000 BID. She attributes weight gain to change from metformin XL to HCL form. She Is requesting januvia in place of metformin. We discussed that [place] is not typically used alone as treatment of diabetes, but in combination with metformin. We discussed side effect profile, risks and benefits of metformin. We also discussed her diet and exercise habits which likely contribute to her weight gain. Ultimately, she would like to go back onto metformin XR, she did not notice weight gain on this medication. BMET- 12/08/16- normal renal function Encouraged annual diabetic eye exam, she says she will see her ophthalmologist this month.  - HM DIABETES FOOT EXAM; Future - POCT HgB A1C - metFORMIN (GLUCOPHAGE XR) 500 MG 24 hr tablet; Take 2 tablets (1,000 mg total) by mouth every evening.  Dispense: 60 tablet; Refill: 1

## 2017-01-28 NOTE — Patient Instructions (Addendum)
Please begin glucophage XR 500 mg 2 tablets every evening.  Please follow up for blood sugar readings over 150, under 70.  Id like to see you back in about 1 month, to see how you are doing.  It was nice to meet you. Thanks for letting me take care of you today :)

## 2017-02-19 ENCOUNTER — Other Ambulatory Visit: Payer: Self-pay

## 2017-02-19 DIAGNOSIS — E119 Type 2 diabetes mellitus without complications: Secondary | ICD-10-CM

## 2017-02-19 MED ORDER — METFORMIN HCL ER 500 MG PO TB24: 1000.0000 mg | ORAL_TABLET | Freq: Every evening | ORAL | 1 refills | Status: DC

## 2017-03-04 ENCOUNTER — Ambulatory Visit: Payer: Self-pay | Admitting: Nurse Practitioner

## 2017-03-04 ENCOUNTER — Encounter: Payer: Self-pay | Admitting: Nurse Practitioner

## 2017-03-04 VITALS — BP 130/86 | HR 90 | Temp 98.2°F | Resp 16 | Ht 67.0 in | Wt 273.8 lb

## 2017-03-04 DIAGNOSIS — E119 Type 2 diabetes mellitus without complications: Secondary | ICD-10-CM

## 2017-03-04 DIAGNOSIS — R51 Headache: Secondary | ICD-10-CM

## 2017-03-04 DIAGNOSIS — R519 Headache, unspecified: Secondary | ICD-10-CM

## 2017-03-04 MED ORDER — KETOROLAC TROMETHAMINE 60 MG/2ML IM SOLN
60.0000 mg | Freq: Once | INTRAMUSCULAR | Status: AC
  Administered 2017-03-04: 60 mg via INTRAMUSCULAR

## 2017-03-04 NOTE — Progress Notes (Signed)
Name: Carrie Neal   MRN: [id number]    DOB: 10/26/1970   Date:03/04/2017       Progress Note  Subjective  Chief Complaint  Chief Complaint  Patient presents with  . Follow-up    diabetes, has been having headaches wonders if it is a side effect of metformin ER    HPI Ms Carrie Neal presents today for a diabetes follow up. She also has an acute complaint of headaches.   Diabetes-  On her last visit (12/13) we switched her metformin to the XL version per her request, as she felt that the regular metformin was making her gain weight. The dosage remained the same-1000 daily. She has lost 5 pounds since the change in medication formulation. Reports daily medication compliance without adverse medication effects. Reports she had been checking her blood sugar a few times a week with readings from 110-140. Denies tremor, diaphoresis, polyuria, polydipsia, polyphagia.  Lab Results  Component Value Date   HGBA1C 6.1 01/28/2017   Wt Readings from Last 3 Encounters:  03/04/17 273 lb 12.8 oz (124.2 kg)  01/28/17 278 lb (126.1 kg)  09/01/16 253 lb 12.8 oz (115.1 kg)   Headaches She has noticed intermittent headaches for past 2-3 weeks, about 2 days a week. Describes as a Throbbing pain in her forehead. Headaches are not induced with activity. Denies changes in sleep or daily routine, head injury. Denies fevers, syncope, weakness, confusion, vision changes, sinus congestion, ear pain or pressure, postnasal drip, nausea or vomiting. She has had headaches in the past but these headaches are different She has tried ibuprofen or Excedrin with only partial relief. The headaches will usually will resolve over time.  Patient Active Problem List   Diagnosis Date Noted  . Status post vaginal hysterectomy 11/18/2015  . Seasonal allergies 05/21/2014  . Type 2 diabetes mellitus ([place]) 04/27/2014  . DKA (diabetic ketoacidoses) ([place]) 04/13/2014  . Tachycardia 04/13/2014  . Hypercalcemia 04/13/2014  . DUB  (dysfunctional uterine bleeding) 03/22/2014  . Anemia 03/22/2014    Past Surgical History:  Procedure Laterality Date  . ABDOMINAL HYSTERECTOMY    . CHOLECYSTECTOMY    . CYSTOSCOPY N/A 11/18/2015   Procedure: CYSTOSCOPY;  Surgeon: Waymon Amato, MD;  Location: [facility];  Service: Gynecology;  Laterality: N/A;  . DILATATION & CURETTAGE/HYSTEROSCOPY WITH MYOSURE N/A 03/30/2014   Procedure: DILATATION & CURETTAGE/HYSTEROSCOPY WITH MYOSURE and placement of Mirena Intrauterine device;  Surgeon: Alinda Dooms, MD;  Location: [facility];  Service: Gynecology;  Laterality: N/A;  . HERNIA REPAIR    . TUBAL LIGATION    . UMBILICAL HERNIA REPAIR     as child  . VAGINAL HYSTERECTOMY Bilateral 11/18/2015   Procedure: HYSTERECTOMY VAGINAL Rightl Salpingectomy;  Surgeon: Waymon Amato, MD;  Location: [facility];  Service: Gynecology;  Laterality: Bilateral;    Family History  Problem Relation Age of Onset  . Diabetes Mother   . Diabetes Cousin   . Diabetes Maternal Grandmother     Social History   Socioeconomic History  . Marital status: Married    Spouse name: Not on file  . Number of children: 6  . Years of education: 58  . Highest education level: Not on file  Social Needs  . Financial resource strain: Not on file  . Food insecurity - worry: Not on file  . Food insecurity - inability: Not on file  . Transportation needs - medical: Not on file  . Transportation needs - non-medical: Not on file  Occupational History  .  Occupation: CNA  Tobacco Use  . Smoking status: Former Smoker    Years: 4.00    Types: Cigarettes  . Smokeless tobacco: Never Used  Substance and Sexual Activity  . Alcohol use: No  . Drug use: No  . Sexual activity: Yes    Birth control/protection: Surgical  Other Topics Concern  . Not on file  Social History Narrative   Born and raised in [place], [place]. Fun: shop   Denies religious beliefs effecting healthcare.    Works as in Midwife.   Does not routinely exercise.      Current Outpatient Medications:  .  fluconazole (DIFLUCAN) 150 MG tablet, Take 1 tablet by mouth every 72 hours., Disp: 3 tablet, Rfl: 0 .  gabapentin (NEURONTIN) 300 MG capsule, Take 1 capsule by mouth for 1 day then increase to 1 capsule by mouth twice daily x 1 day then increase to 1 capsule by mouth 3x times daily (Patient taking differently: Take 300 mg by mouth 2 (two) times daily as needed (leg pain). ), Disp: 90 capsule, Rfl: 0 .  metFORMIN (GLUCOPHAGE XR) 500 MG 24 hr tablet, Take 2 tablets (1,000 mg total) by mouth every evening., Disp: 60 tablet, Rfl: 1 .  vitamin B-12 (CYANOCOBALAMIN) 1000 MCG tablet, Take 500 mcg by mouth daily., Disp: , Rfl:   Allergies  Allergen Reactions  . Codeine Itching    ROS See HPI  Objective  Vitals:   03/04/17 1557  BP: 130/86  Pulse: 90  Resp: 16  Temp: 98.2 F (36.8 C)  TempSrc: Oral  SpO2: 96%  Weight: 273 lb 12.8 oz (124.2 kg)  Height: 5\' 7"  (1.702 m)    Body mass index is 42.88 kg/m.  Physical Exam Constitutional: Patient appears well-developed and well-nourished. No distress.  HENT: Head: Normocephalic and atraumatic. Frontal sinus tenderness. Ears: B TMs ok, no erythema or effusion; Nose: Nose normal. Mouth/Throat: Oropharynx is clear and moist. No oropharyngeal exudate.  Eyes: Conjunctivae and EOM are normal. Pupils are equal, round, and reactive to light. No scleral icterus.  Neck: Normal range of motion. Neck supple. Cardiovascular: Normal rate, regular rhythm and normal heart sounds.  No murmur heard. No BLE edema. Pulmonary/Chest: Effort normal and breath sounds normal. No respiratory distress. Musculoskeletal: Normal range of motion, no joint effusions. No gross deformities Neurological: She is alert and oriented to person, place, and time. No cranial nerve deficit. Coordination, balance, strength, speech and gait are normal.  Skin: Skin is warm and dry. No rash noted. No erythema.  Psychiatric: Patient has a  normal mood and affect. behavior is normal. Judgment and thought content normal.  PHQ2/9: Depression screen PHQ 2/9 01/28/2017  Decreased Interest 0  Down, Depressed, Hopeless 0  PHQ - 2 Score 0   Fall Risk: Fall Risk  01/28/2017  Falls in the past year? No     Assessment & Plan RTC around June for 6 month diabetes follow up, we can do annual physical at that time if she would like  RTC sooner for persistent headaches  Nonintractable episodic headache, unspecified headache type We discussed possible causes of headache including stress, lack of sleep, caffeine. Pt education handout given. Danger signs and Return precautions given. Instructions to keep headache journal and return for F/u of continued headaches. - ketorolac (TORADOL) injection 60 mg

## 2017-03-04 NOTE — Patient Instructions (Addendum)
I'd like to see you back around March for a follow up of your diabetes. We can do an annual physical that day if you are interested.   We will continue your metformin at current dosage. We will recheck your A1c at your next visit.  If you continue to get headaches, I'd like to see you back sooner. Try to keep a daily log of what time of day the headache occurs, how many hours you slept, any caffeine you drank, any medications you took.   It was good to see you. Thanks for letting me take care of you today :)  General Headache Without Cause A headache is pain or discomfort felt around the head or neck area. There are many causes and types of headaches. In some cases, the cause may not be found. Follow these instructions at home: Managing pain  Take over-the-counter and prescription medicines only as told by your doctor.  Lie down in a dark, quiet room when you have a headache.  If directed, apply ice to the head and neck area: ? Put ice in a plastic bag. ? Place a towel between your skin and the bag. ? Leave the ice on for 20 minutes, 2-3 times per day.  Use a heating pad or hot shower to apply heat to the head and neck area as told by your doctor.  Keep lights dim if bright lights bother you or make your headaches worse. Eating and drinking  Eat meals on a regular schedule.  Lessen how much alcohol you drink.  Lessen how much caffeine you drink, or stop drinking caffeine. General instructions  Keep all follow-up visits as told by your doctor. This is important.  Keep a journal to find out if certain things bring on headaches. For example, write down: ? What you eat and drink. ? How much sleep you get. ? Any change to your diet or medicines.  Relax by getting a massage or doing other relaxing activities.  Lessen stress.  Sit up straight. Do not tighten (tense) your muscles.  Do not use tobacco products. This includes cigarettes, chewing tobacco, or e-cigarettes. If you  need help quitting, ask your doctor.  Exercise regularly as told by your doctor.  Get enough sleep. This often means 7-9 hours of sleep. Contact a doctor if:  Your symptoms are not helped by medicine.  You have a headache that feels different than the other headaches.  You feel sick to your stomach (nauseous) or you throw up (vomit).  You have a fever. Get help right away if:  Your headache becomes really bad.  You keep throwing up.  You have a stiff neck.  You have trouble seeing.  You have trouble speaking.  You have pain in the eye or ear.  Your muscles are weak or you lose muscle control.  You lose your balance or have trouble walking.  You feel like you will pass out (faint) or you pass out.  You have confusion. This information is not intended to replace advice given to you by your health care provider. Make sure you discuss any questions you have with your health care provider. Document Released: 11/12/2007 Document Revised: 07/11/2015 Document Reviewed: 05/28/2014 Elsevier Interactive Patient Education  [employer].

## 2017-03-04 NOTE — Assessment & Plan Note (Addendum)
She is not feeling as hungry or gaining weight with metformin XL. Blood glucose readings are stable. We will continue metformin at current dosage and she will continue to monitor blood glucose readings at home RTC around June for follow up

## 2017-07-25 ENCOUNTER — Other Ambulatory Visit: Payer: Self-pay | Admitting: Nurse Practitioner

## 2017-07-25 DIAGNOSIS — E119 Type 2 diabetes mellitus without complications: Secondary | ICD-10-CM

## 2017-08-22 ENCOUNTER — Encounter (HOSPITAL_COMMUNITY): Payer: Self-pay | Admitting: Emergency Medicine

## 2017-08-22 ENCOUNTER — Emergency Department (HOSPITAL_COMMUNITY)
Admission: EM | Admit: 2017-08-22 | Discharge: 2017-08-23 | Disposition: A | Discharge status: Home / Self Care | Payer: Self-pay | Attending: Emergency Medicine | Admitting: Emergency Medicine

## 2017-08-22 DIAGNOSIS — Z79899 Other long term (current) drug therapy: Secondary | ICD-10-CM | POA: Insufficient documentation

## 2017-08-22 DIAGNOSIS — J45909 Unspecified asthma, uncomplicated: Secondary | ICD-10-CM | POA: Insufficient documentation

## 2017-08-22 DIAGNOSIS — I110 Hypertensive heart disease with heart failure: Secondary | ICD-10-CM | POA: Insufficient documentation

## 2017-08-22 DIAGNOSIS — Z87891 Personal history of nicotine dependence: Secondary | ICD-10-CM | POA: Insufficient documentation

## 2017-08-22 DIAGNOSIS — Z7984 Long term (current) use of oral hypoglycemic drugs: Secondary | ICD-10-CM | POA: Insufficient documentation

## 2017-08-22 DIAGNOSIS — E119 Type 2 diabetes mellitus without complications: Secondary | ICD-10-CM | POA: Insufficient documentation

## 2017-08-22 DIAGNOSIS — J329 Chronic sinusitis, unspecified: Secondary | ICD-10-CM | POA: Insufficient documentation

## 2017-08-22 DIAGNOSIS — I5030 Unspecified diastolic (congestive) heart failure: Secondary | ICD-10-CM | POA: Insufficient documentation

## 2017-08-22 NOTE — ED Triage Notes (Signed)
Reports headaches continuously for two weeks.  No relief with ibuprofen.  Also endorses nausea and light sensitivity.

## 2017-08-23 ENCOUNTER — Emergency Department (HOSPITAL_COMMUNITY): Payer: Self-pay

## 2017-08-23 ENCOUNTER — Encounter (HOSPITAL_COMMUNITY): Payer: Self-pay | Admitting: Emergency Medicine

## 2017-08-23 MED ORDER — DEXAMETHASONE SODIUM PHOSPHATE 10 MG/ML IJ SOLN
4.0000 mg | Freq: Once | INTRAMUSCULAR | Status: AC
  Administered 2017-08-23: 4 mg via INTRAVENOUS
  Filled 2017-08-23: qty 1

## 2017-08-23 MED ORDER — KETOROLAC TROMETHAMINE 30 MG/ML IJ SOLN
30.0000 mg | Freq: Once | INTRAMUSCULAR | Status: AC
  Administered 2017-08-23: 30 mg via INTRAVENOUS
  Filled 2017-08-23: qty 1

## 2017-08-23 MED ORDER — ACETAMINOPHEN 500 MG PO TABS
1000.0000 mg | ORAL_TABLET | Freq: Once | ORAL | Status: AC
Start: 2017-08-23 — End: 2017-08-23
  Administered 2017-08-23: 1000 mg via ORAL
  Filled 2017-08-23: qty 2

## 2017-08-23 MED ORDER — MAGNESIUM SULFATE 2 GM/50ML IV SOLN
2.0000 g | Freq: Once | INTRAVENOUS | Status: AC
  Administered 2017-08-23: 2 g via INTRAVENOUS
  Filled 2017-08-23: qty 50

## 2017-08-23 MED ORDER — AZITHROMYCIN 250 MG PO TABS: ORAL_TABLET | ORAL | 0 refills | Status: DC

## 2017-08-23 MED ORDER — SODIUM CHLORIDE 0.9 % IV BOLUS
500.0000 mL | Freq: Once | INTRAVENOUS | Status: AC
  Administered 2017-08-23: 500 mL via INTRAVENOUS

## 2017-08-23 MED ORDER — DIVALPROEX SODIUM 250 MG PO DR TAB
500.0000 mg | DELAYED_RELEASE_TABLET | Freq: Two times a day (BID) | ORAL | Status: DC
  Administered 2017-08-23: 500 mg via ORAL
  Filled 2017-08-23: qty 2

## 2017-08-23 NOTE — ED Provider Notes (Signed)
[facility] Provider Note   CSN: [id number] Arrival date & time: 08/22/17  2143     History   Chief Complaint Chief Complaint  Patient presents with  . Headache    HPI Carrie Neal is a 47 y.o. female.  The history is provided by the patient.  Headache   This is a chronic problem. The current episode started more than 1 week ago. The problem occurs constantly. The problem has not changed since onset.The headache is associated with an unknown factor. The pain is located in the frontal region. The quality of the pain is described as dull. The pain is severe. The pain does not radiate. Pertinent negatives include no anorexia, no fever, no malaise/fatigue, no chest pressure, no near-syncope, no orthopnea, no palpitations, no syncope, no shortness of breath, no nausea and no vomiting. She has tried nothing for the symptoms. The treatment provided no relief.    Past Medical History:  Diagnosis Date  . Anemia   . Asthma    no inhaler  . Chicken pox   . Diastolic dysfunction    a. 03/2014 Echo: EF 65-70%, gr1 DD, no rwma.  . Dysfunctional uterine bleeding    a. 11/2015 s/p partial hysterectomy.  . Hypertension   . Kidney stones   . Morbid obesity ([place])   . Type 2 diabetes mellitus ([place])    a. Dx 2016.    Patient Active Problem List   Diagnosis Date Noted  . Status post vaginal hysterectomy 11/18/2015  . Seasonal allergies 05/21/2014  . Type 2 diabetes mellitus ([place]) 04/27/2014  . DKA (diabetic ketoacidoses) ([place]) 04/13/2014  . Tachycardia 04/13/2014  . Hypercalcemia 04/13/2014  . DUB (dysfunctional uterine bleeding) 03/22/2014  . Anemia 03/22/2014    Past Surgical History:  Procedure Laterality Date  . ABDOMINAL HYSTERECTOMY    . CHOLECYSTECTOMY    . CYSTOSCOPY N/A 11/18/2015   Procedure: CYSTOSCOPY;  Surgeon: Waymon Amato, MD;  Location: [facility];  Service: Gynecology;  Laterality: N/A;  . DILATATION & CURETTAGE/HYSTEROSCOPY WITH  MYOSURE N/A 03/30/2014   Procedure: DILATATION & CURETTAGE/HYSTEROSCOPY WITH MYOSURE and placement of Mirena Intrauterine device;  Surgeon: Alinda Dooms, MD;  Location: [facility];  Service: Gynecology;  Laterality: N/A;  . HERNIA REPAIR    . TUBAL LIGATION    . UMBILICAL HERNIA REPAIR     as child  . VAGINAL HYSTERECTOMY Bilateral 11/18/2015   Procedure: HYSTERECTOMY VAGINAL Rightl Salpingectomy;  Surgeon: Waymon Amato, MD;  Location: [facility];  Service: Gynecology;  Laterality: Bilateral;     OB History    Gravida  6   Para  6   Term  6   Preterm      AB  0   Living  6     SAB  0   TAB      Ectopic      Multiple      Live Births               Home Medications    Prior to Admission medications   Medication Sig Start Date End Date Taking? Authorizing Provider  fluconazole (DIFLUCAN) 150 MG tablet Take 1 tablet by mouth every 72 hours. 09/01/16   Golden Circle, FNP  gabapentin (NEURONTIN) 300 MG capsule Take 1 capsule by mouth for 1 day then increase to 1 capsule by mouth twice daily x 1 day then increase to 1 capsule by mouth 3x times daily Patient taking differently: Take 300 mg  by mouth 2 (two) times daily as needed (leg pain).  08/15/15   Golden Circle, FNP  metFORMIN (GLUCOPHAGE XR) 500 MG 24 hr tablet Take 2 tablets (1,000 mg total) by mouth every evening. 02/19/17   Lance Sell, NP  metFORMIN (GLUCOPHAGE-XR) 500 MG 24 hr tablet TAKE 2 TABLETS BY MOUTH EVERY DAY WITH BREAKFAST 07/26/17   Lance Sell, NP  vitamin B-12 (CYANOCOBALAMIN) 1000 MCG tablet Take 500 mcg by mouth daily.    [provider]    Family History Family History  Problem Relation Age of Onset  . Diabetes Mother   . Diabetes Cousin   . Diabetes Maternal Grandmother     Social History Social History   Tobacco Use  . Smoking status: Former Smoker    Years: 4.00    Types: Cigarettes  . Smokeless tobacco: Never Used  Substance Use Topics  . Alcohol use: No    . Drug use: No     Allergies   Codeine   Review of Systems Review of Systems  Constitutional: Negative for diaphoresis, fever and malaise/fatigue.  HENT: Positive for congestion and sinus pressure. Negative for dental problem, drooling, ear discharge, ear pain, facial swelling, hearing loss, mouth sores, sneezing and trouble swallowing.   Eyes: Negative for photophobia and visual disturbance.  Respiratory: Negative for shortness of breath.   Cardiovascular: Negative for palpitations, orthopnea, syncope and near-syncope.  Gastrointestinal: Negative for abdominal pain, anorexia, nausea and vomiting.  Genitourinary: Negative for flank pain.  Neurological: Positive for headaches. Negative for dizziness, tremors, seizures, syncope, facial asymmetry, speech difficulty, weakness, light-headedness and numbness.  Psychiatric/Behavioral: Negative for confusion.  All other systems reviewed and are negative.    Physical Exam Updated Vital Signs BP 125/83 (BP Location: Right Arm)   Pulse 88   Temp 98.6 F (37 C) (Oral)   Resp 16   Ht 5\' 7"  (1.702 m)   Wt 126.6 kg (279 lb)   LMP  (LMP Unknown) Comment: AUB  SpO2 100%   BMI 43.70 kg/m   Physical Exam  Constitutional: She is oriented to person, place, and time. She appears well-developed and well-nourished. No distress.  HENT:  Head: Normocephalic and atraumatic.  Right Ear: External ear normal.  Left Ear: External ear normal.  Nose: Nose normal.  Mouth/Throat: Oropharynx is clear and moist. No oropharyngeal exudate.  Eyes: Pupils are equal, round, and reactive to light. Conjunctivae and EOM are normal.  No proptosis, intact cognition, EOMI  Neck: Normal range of motion. Neck supple.  Cardiovascular: Normal rate, regular rhythm, normal heart sounds and intact distal pulses.  Pulmonary/Chest: Effort normal and breath sounds normal. No stridor. No respiratory distress. She has no wheezes. She has no rales.  Abdominal: Soft. Bowel  sounds are normal. She exhibits no mass. There is no tenderness. There is no rebound and no guarding.  Musculoskeletal: Normal range of motion.  Neurological: She is alert and oriented to person, place, and time. She displays normal reflexes. No cranial nerve deficit. Coordination normal.  Skin: Skin is warm and dry. Capillary refill takes less than 2 seconds.  Psychiatric: She has a normal mood and affect.     ED Treatments / Results  Labs (all labs ordered are listed, but only abnormal results are displayed) Labs Reviewed - No data to display  EKG None  Radiology Ct Head Wo Contrast  Result Date: 08/23/2017 CLINICAL DATA:  Severe headache. EXAM: CT HEAD WITHOUT CONTRAST TECHNIQUE: Contiguous axial images were obtained from the  base of the skull through the vertex without intravenous contrast. COMPARISON:  12/20/2003 FINDINGS: Brain: No evidence of infarction, hemorrhage, hydrocephalus, extra-axial collection or mass lesion/mass effect. Vascular: No hyperdense vessel or unexpected calcification. Skull: Normal. Negative for fracture or focal lesion. Sinuses/Orbits: Extensive, nearly confluent mucosal thickening in the ethmoids. Partial coverage of the frontal and maxillary sinuses also shows mucosal thickening/secretions. IMPRESSION: 1. Negative intracranial imaging. 2. Bilateral active sinusitis. Electronically Signed   By: Monte Fantasia M.D.   On: 08/23/2017 02:31    Procedures Procedures (including critical care time)  Medications Ordered in ED Medications  sodium chloride 0.9 % bolus 500 mL (500 mLs Intravenous New Bag/Given 08/23/17 0231)  divalproex (DEPAKOTE) DR tablet 500 mg (500 mg Oral Given 08/23/17 0239)  magnesium sulfate IVPB 2 g 50 mL (has no administration in time range)  ketorolac (TORADOL) 30 MG/ML injection 30 mg (30 mg Intravenous Given 08/23/17 0226)  acetaminophen (TYLENOL) tablet 1,000 mg (1,000 mg Oral Given 08/23/17 0225)  dexamethasone (DECADRON) injection 4 mg (4  mg Intravenous Given 08/23/17 0227)       Final Clinical Impressions(s) / ED Diagnoses    Return for pain, numbness, changes in vision or speech, fevers >100.4 unrelieved by medication, shortness of breath, intractable vomiting, or diarrhea, abdominal pain, Inability to tolerate liquids or food, cough, altered mental status or any concerns. No signs of systemic illness or infection. The patient is nontoxic-appearing on exam and vital signs are within normal limits. Will refer to urology for microscopy hematuria as patient is asymptomatic.  I have reviewed the triage vital signs and the nursing notes. Pertinent labs &imaging results that were available during my care of the patient were reviewed by me and considered in my medical decision making (see chart for details).  After history, exam, and medical workup I feel the patient has been appropriately medically screened and is safe for discharge home. Pertinent diagnoses were discussed with the patient. Patient was given return precautions.      Damauri Minion, MD 08/23/17 7915

## 2017-09-02 ENCOUNTER — Ambulatory Visit: Payer: Self-pay | Admitting: Nurse Practitioner

## 2017-09-13 ENCOUNTER — Other Ambulatory Visit (INDEPENDENT_AMBULATORY_CARE_PROVIDER_SITE_OTHER): Payer: Self-pay

## 2017-09-13 ENCOUNTER — Encounter: Payer: Self-pay | Admitting: Nurse Practitioner

## 2017-09-13 ENCOUNTER — Ambulatory Visit: Payer: Self-pay | Admitting: Nurse Practitioner

## 2017-09-13 VITALS — BP 122/76 | HR 96 | Temp 98.2°F | Resp 18 | Ht 67.0 in | Wt 277.0 lb

## 2017-09-13 DIAGNOSIS — M79605 Pain in left leg: Secondary | ICD-10-CM

## 2017-09-13 DIAGNOSIS — Z6841 Body Mass Index (BMI) 40.0 and over, adult: Secondary | ICD-10-CM

## 2017-09-13 DIAGNOSIS — E119 Type 2 diabetes mellitus without complications: Secondary | ICD-10-CM

## 2017-09-13 DIAGNOSIS — M79604 Pain in right leg: Secondary | ICD-10-CM

## 2017-09-13 DIAGNOSIS — N898 Other specified noninflammatory disorders of vagina: Secondary | ICD-10-CM

## 2017-09-13 LAB — COMPREHENSIVE METABOLIC PANEL
ALT: 18 U/L (ref 0–35)
AST: 11 U/L (ref 0–37)
Albumin: 3.9 g/dL (ref 3.5–5.2)
Alkaline Phosphatase: 87 U/L (ref 39–117)
BUN: 8 mg/dL (ref 6–23)
CHLORIDE: 103 meq/L (ref 96–112)
CO2: 26 meq/L (ref 19–32)
CREATININE: 0.66 mg/dL (ref 0.40–1.20)
Calcium: 11.1 mg/dL — ABNORMAL HIGH (ref 8.4–10.5)
GFR: 122.21 mL/min (ref >60.00)
GLUCOSE: 277 mg/dL — AB (ref 70–99)
Potassium: 3.9 mEq/L (ref 3.5–5.1)
Sodium: 137 mEq/L (ref 135–145)
Total Bilirubin: 0.3 mg/dL (ref 0.2–1.2)
Total Protein: 7.7 g/dL (ref 6.0–8.3)

## 2017-09-13 LAB — CBC
HEMATOCRIT: 37 % (ref 36.0–46.0)
HEMOGLOBIN: 12.5 g/dL (ref 12.0–15.0)
MCHC: 33.8 g/dL (ref 30.0–36.0)
MCV: 83.6 fl (ref 78.0–100.0)
Platelets: 258 10*3/uL (ref 150.0–400.0)
RBC: 4.43 Mil/uL (ref 3.87–5.11)
RDW: 15.4 % (ref 11.5–15.5)
WBC: 5.3 10*3/uL (ref 4.0–10.5)

## 2017-09-13 LAB — TSH: TSH: 0.94 u[IU]/mL (ref 0.35–4.50)

## 2017-09-13 LAB — MICROALBUMIN / CREATININE URINE RATIO
CREATININE, U: 125.2 mg/dL
MICROALB UR: 0.8 mg/dL (ref 0.0–1.9)
Microalb Creat Ratio: 0.7 mg/g (ref 0.0–30.0)

## 2017-09-13 LAB — HEMOGLOBIN A1C: HEMOGLOBIN A1C: 8.2 % — AB (ref 4.6–6.5)

## 2017-09-13 MED ORDER — FLUCONAZOLE 150 MG PO TABS: ORAL_TABLET | ORAL | 1 refills | Status: DC

## 2017-09-13 NOTE — Assessment & Plan Note (Signed)
Continue metformin at current dosage Update labs Discussed the role of healthy diet and exercise in the management of diabetes, discussed referral to nutritionist today and she is agreeable F/U with further recommendations pending lab results - Hemoglobin A1c; Future - Microalbumin / creatinine urine ratio; Future - Amb Referral to Nutrition and Diabetic E - Comprehensive metabolic panel; Future

## 2017-09-13 NOTE — Progress Notes (Signed)
Name: Carrie Neal   MRN: [id number]    DOB: 26-Apr-1970   Date:09/13/2017       Progress Note  Subjective  Chief Complaint  Chief Complaint  Patient presents with  . Follow-up    A1c check, bilateral leg pain that has gotten worse    HPI  Carrie Neal is here today for diabetes follow up. She is also requesting evaluation of leg pain and yeast infection.   Diabetes- maintained on metformin XR 1000 daily Reports daily medication compliance without noted adverse medication effects. Reports she has not been checking her blood sugars recently Denies  tremor, diaphoresis, palpitations, polyuria, polydipsia. Reports she has gained more weight this year, feels so hungry all the time and just wants to keep eating, she does not watch her diet.  Lab Results  Component Value Date   HGBA1C 6.1 01/28/2017   Wt Readings from Last 3 Encounters:  09/13/17 277 lb (125.6 kg)  08/22/17 279 lb (126.6 kg)  03/04/17 273 lb 12.8 oz (124.2 kg)   Leg pain- This is not a new problem, she reports she began to have bilateral leg pain a year or more ago, was started on gabapentin  300 twice daily which does seems to help her pain She reports the pain seems increased recently, especially when she misses doses of neurontin,which she admits she does not take exactly as prescribed. She says the pain is worse in her right leg and right knee, and sometimes feels her right knee might give out when she is walking She denies falls, fevers, back pain, erythema, bruising, swelling, numbness, tingling. She says she realizes that her weight seems to be a cause of some of her pain, as she gains weight the pain gets worse.  Vaginal itching- This is a new problem She reports vaginal itching and scant white vaginal discharge this week, started after using new soap. She tried OTC yeast pill with some relief but symptoms persist She says she is only sexually active with husband and is not concerned for STDS She denies abdominal  pain, pelvic pain, dysuria, hematuria, vaginal bleeding She says This feels like when she had a yeast infection and asks for diflucan course  Patient Active Problem List   Diagnosis Date Noted  . Status post vaginal hysterectomy 11/18/2015  . Seasonal allergies 05/21/2014  . Type 2 diabetes mellitus ([place]) 04/27/2014  . DKA (diabetic ketoacidoses) ([place]) 04/13/2014  . Tachycardia 04/13/2014  . Hypercalcemia 04/13/2014  . DUB (dysfunctional uterine bleeding) 03/22/2014  . Anemia 03/22/2014    Past Surgical History:  Procedure Laterality Date  . ABDOMINAL HYSTERECTOMY    . CHOLECYSTECTOMY    . CYSTOSCOPY N/A 11/18/2015   Procedure: CYSTOSCOPY;  Surgeon: Waymon Amato, MD;  Location: [facility];  Service: Gynecology;  Laterality: N/A;  . DILATATION & CURETTAGE/HYSTEROSCOPY WITH MYOSURE N/A 03/30/2014   Procedure: DILATATION & CURETTAGE/HYSTEROSCOPY WITH MYOSURE and placement of Mirena Intrauterine device;  Surgeon: Alinda Dooms, MD;  Location: [facility];  Service: Gynecology;  Laterality: N/A;  . HERNIA REPAIR    . TUBAL LIGATION    . UMBILICAL HERNIA REPAIR     as child  . VAGINAL HYSTERECTOMY Bilateral 11/18/2015   Procedure: HYSTERECTOMY VAGINAL Rightl Salpingectomy;  Surgeon: Waymon Amato, MD;  Location: [facility];  Service: Gynecology;  Laterality: Bilateral;    Family History  Problem Relation Age of Onset  . Diabetes Mother   . Diabetes Cousin   . Diabetes Maternal Grandmother     Social History  Socioeconomic History  . Marital status: Married    Spouse name: Not on file  . Number of children: 6  . Years of education: 67  . Highest education level: Not on file  Occupational History  . Occupation: CNA  Social Needs  . Financial resource strain: Not on file  . Food insecurity:    Worry: Not on file    Inability: Not on file  . Transportation needs:    Medical: Not on file    Non-medical: Not on file  Tobacco Use  . Smoking status: Former Smoker    Years: 4.00    Types:  Cigarettes  . Smokeless tobacco: Never Used  Substance and Sexual Activity  . Alcohol use: No  . Drug use: No  . Sexual activity: Yes    Birth control/protection: Surgical  Lifestyle  . Physical activity:    Days per week: Not on file    Minutes per session: Not on file  . Stress: Not on file  Relationships  . Social connections:    Talks on phone: Not on file    Gets together: Not on file    Attends religious service: Not on file    Active member of club or organization: Not on file    Attends meetings of clubs or organizations: Not on file    Relationship status: Not on file  . Intimate partner violence:    Fear of current or ex partner: Not on file    Emotionally abused: Not on file    Physically abused: Not on file    Forced sexual activity: Not on file  Other Topics Concern  . Not on file  Social History Narrative   Born and raised in [place], [place]. Fun: shop   Denies religious beliefs effecting healthcare.    Works as in Midwife.   Does not routinely exercise.     Current Outpatient Medications:  .  fluconazole (DIFLUCAN) 150 MG tablet, Take 1 tablet by mouth every 72 hours., Disp: 3 tablet, Rfl: 0 .  gabapentin (NEURONTIN) 300 MG capsule, Take 1 capsule by mouth for 1 day then increase to 1 capsule by mouth twice daily x 1 day then increase to 1 capsule by mouth 3x times daily (Patient taking differently: Take 300 mg by mouth 2 (two) times daily as needed (leg pain). ), Disp: 90 capsule, Rfl: 0 .  metFORMIN (GLUCOPHAGE XR) 500 MG 24 hr tablet, Take 2 tablets (1,000 mg total) by mouth every evening., Disp: 60 tablet, Rfl: 1 .  metFORMIN (GLUCOPHAGE-XR) 500 MG 24 hr tablet, TAKE 2 TABLETS BY MOUTH EVERY DAY WITH BREAKFAST, Disp: 60 tablet, Rfl: 1 .  vitamin B-12 (CYANOCOBALAMIN) 1000 MCG tablet, Take 500 mcg by mouth daily., Disp: , Rfl:   Allergies  Allergen Reactions  . Codeine Itching     ROS See HPI  Objective  Vitals:   09/13/17 1104  BP: 122/76   Pulse: 96  Resp: 18  Temp: 98.2 F (36.8 C)  TempSrc: Oral  SpO2: 98%  Weight: 277 lb (125.6 kg)  Height: 5\' 7"  (1.702 m)    Body mass index is 43.38 kg/m.  Physical Exam Vital signs reviewed. Constitutional: Patient appears well-developed and well-nourished. No distress.  HENT: Head: Normocephalic and atraumatic.  Nose: Nose normal. Mouth/Throat: Oropharynx is clear and moist. No oropharyngeal exudate.  Eyes: Conjunctivae and EOM are normal. Pupils are equal, round, and reactive to light. No scleral icterus.  Neck: Normal range of motion. Neck supple.  No thyromegaly present. No cervical adenopathy. Cardiovascular: Normal rate, regular rhythm and normal heart sounds.  No murmur heard. No BLE edema. Distal pulses intact. Pulmonary/Chest: Effort normal and breath sounds normal. No respiratory distress. Musculoskeletal: Normal range of motion, No gross deformities Neurological: She is alert and oriented to person, place, and time. No cranial nerve deficit. Coordination, balance, strength, speech and gait are normal.  Skin: Skin is warm and dry. No rash noted. No erythema.  Psychiatric: Patient has a normal mood and affect. behavior is normal. Judgment and thought content normal.   Assessment & Plan RTC in 3 months for F/U: Leg pain, Obesity- referral to nutrition today, Check lipid panel (was going to update today but she asked to wait until next OV since she ate [employer] fast food meal this am)  -Reviewed Health Maintenance:  - Hemoglobin A1c; Future - Microalbumin / creatinine urine ratio; Future   Vaginal itching Declines pelvic exam today Diflucan sent with dosing and side effects discussed F/U for new, worsening symptoms or if symptoms do not improve with diflucan course - fluconazole (DIFLUCAN) 150 MG tablet; Take 1 tablet by mouth every 72 hours.  Dispense: 1 tablet; Refill: 1  Class 3 severe obesity with body mass index (BMI) of 40.0 to 44.9 in adult, unspecified obesity  type, unspecified whether serious comorbidity present [facility]) Discussed home management of obesity including healthy diet, regular exercise, calorie counting and printed additional information in AVS She is agreeable to nutrition referral today RTC in 3 months for F/U-we can consider referral to medical weight management if needed  - Hemoglobin A1c; Future - CBC; Future - TSH; Future - Comprehensive metabolic panel; Future - Amb Referral to Nutrition and Diabetic E  Pain in both lower extremities We discussed referral to sports medicine for further evaluation of bilateral leg and knee pain, but she declines and says she would like to try to lose weight first  RTC in 3 months for Follow up, or F/U sooner for new, worsening symptoms

## 2017-09-13 NOTE — Patient Instructions (Addendum)
Please head downstairs for lab work. If any of your test results are critically abnormal, you will be contacted right away. Otherwise, I will contact you within a week about your test results and any recommendations for abnormalities.  I have placed a referral to nutritionist for diet education. Our office will begin processing this referral. Please follow up if you have not heard anything about this referral within 10 days.  Please return in 3 months for follow up.   Mediterranean Diet A Mediterranean diet refers to food and lifestyle choices that are based on the traditions of countries located on the [employer]. This way of eating has been shown to help prevent certain conditions and improve outcomes for people who have chronic diseases, like kidney disease and heart disease. What are tips for following this plan? Lifestyle  Cook and eat meals together with your family, when possible.  Drink enough fluid to keep your urine clear or pale yellow.  Be physically active every day. This includes: ? Aerobic exercise like running or swimming. ? Leisure activities like gardening, walking, or housework.  Get 7-8 hours of sleep each night.  If recommended by your health care provider, drink red wine in moderation. This means 1 glass a day for nonpregnant women and 2 glasses a day for men. A glass of wine equals 5 oz (150 mL). Reading food labels  Check the serving size of packaged foods. For foods such as rice and pasta, the serving size refers to the amount of cooked product, not dry.  Check the total fat in packaged foods. Avoid foods that have saturated fat or trans fats.  Check the ingredients list for added sugars, such as corn syrup. Shopping  At the grocery store, buy most of your food from the areas near the walls of the store. This includes: ? Fresh fruits and vegetables (produce). ? Grains, beans, nuts, and seeds. Some of these may be available in unpackaged forms or  large amounts (in bulk). ? Fresh seafood. ? Poultry and eggs. ? Low-fat dairy products.  Buy whole ingredients instead of prepackaged foods.  Buy fresh fruits and vegetables in-season from local farmers markets.  Buy frozen fruits and vegetables in resealable bags.  If you do not have access to quality fresh seafood, buy precooked frozen shrimp or canned fish, such as tuna, salmon, or sardines.  Buy small amounts of raw or cooked vegetables, salads, or olives from the deli or salad bar at your store.  Stock your pantry so you always have certain foods on hand, such as olive oil, canned tuna, canned tomatoes, rice, pasta, and beans. Cooking  Cook foods with extra-virgin olive oil instead of using butter or other vegetable oils.  Have meat as a side dish, and have vegetables or grains as your main dish. This means having meat in small portions or adding small amounts of meat to foods like pasta or stew.  Use beans or vegetables instead of meat in common dishes like chili or lasagna.  Experiment with different cooking methods. Try roasting or broiling vegetables instead of steaming or sauteing them.  Add frozen vegetables to soups, stews, pasta, or rice.  Add nuts or seeds for added healthy fat at each meal. You can add these to yogurt, salads, or vegetable dishes.  Marinate fish or vegetables using olive oil, lemon juice, garlic, and fresh herbs. Meal planning  Plan to eat 1 vegetarian meal one day each week. Try to work up to 2 vegetarian meals, if  possible.  Eat seafood 2 or more times a week.  Have healthy snacks readily available, such as: ? Vegetable sticks with hummus. ? [place] yogurt. ? Fruit and nut trail mix.  Eat balanced meals throughout the week. This includes: ? Fruit: 2-3 servings a day ? Vegetables: 4-5 servings a day ? Low-fat dairy: 2 servings a day ? Fish, poultry, or lean meat: 1 serving a day ? Beans and legumes: 2 or more servings a week ? Nuts and  seeds: 1-2 servings a day ? Whole grains: 6-8 servings a day ? Extra-virgin olive oil: 3-4 servings a day  Limit red meat and sweets to only a few servings a month What are my food choices?  Mediterranean diet ? Recommended ? Grains: Whole-grain pasta. Brown rice. Bulgar wheat. Polenta. Couscous. Whole-wheat bread. [place]. ? Vegetables: Artichokes. Beets. Broccoli. Cabbage. Carrots. Eggplant. Green beans. Chard. Kale. Spinach. Onions. Leeks. Peas. Squash. Tomatoes. Peppers. Radishes. ? Fruits: Apples. Apricots. Avocado. Berries. Bananas. Cherries. Dates. Figs. Grapes. Lemons. Melon. Oranges. Peaches. Plums. Pomegranate. ? Meats and other protein foods: Beans. Almonds. Sunflower seeds. Pine nuts. Peanuts. [place]. Salmon. Scallops. Shrimp. [place]. Tilapia. Clams. Oysters. Eggs. ? Dairy: Low-fat milk. Cheese. Greek yogurt. ? Beverages: Water. Red wine. Herbal tea. ? Fats and oils: Extra virgin olive oil. Avocado oil. Grape seed oil. ? Sweets and desserts: [place] yogurt with honey. Baked apples. Poached pears. Trail mix. ? Seasoning and other foods: Basil. Cilantro. Coriander. Cumin. Mint. Parsley. Sage. Rosemary. Tarragon. Garlic. Oregano. Thyme. Pepper. Balsalmic vinegar. Tahini. Hummus. Tomato sauce. Olives. Mushrooms. ? Limit these ? Grains: Prepackaged pasta or rice dishes. Prepackaged cereal with added sugar. ? Vegetables: Deep fried potatoes (french fries). ? Fruits: Fruit canned in syrup. ? Meats and other protein foods: Beef. Pork. Lamb. Poultry with skin. Hot dogs. Berniece Salines. ? Dairy: Ice cream. Sour cream. Whole milk. ? Beverages: Juice. Sugar-sweetened soft drinks. Beer. Liquor and spirits. ? Fats and oils: Butter. Canola oil. Vegetable oil. Beef fat (tallow). Lard. ? Sweets and desserts: Cookies. Cakes. Pies. Candy. ? Seasoning and other foods: Mayonnaise. Premade sauces and marinades. ? The items listed may not be a complete list. Talk with your dietitian about what dietary  choices are right for you. Summary  The Mediterranean diet includes both food and lifestyle choices.  Eat a variety of fresh fruits and vegetables, beans, nuts, seeds, and whole grains.  Limit the amount of red meat and sweets that you eat.  Talk with your health care provider about whether it is safe for you to drink red wine in moderation. This means 1 glass a day for nonpregnant women and 2 glasses a day for men. A glass of wine equals 5 oz (150 mL). This information is not intended to replace advice given to you by your health care provider. Make sure you discuss any questions you have with your health care provider. Document Released: 09/26/2015 Document Revised: 10/29/2015 Document Reviewed: 09/26/2015 Elsevier Interactive Patient Education  [employer].

## 2017-09-16 ENCOUNTER — Other Ambulatory Visit: Payer: Self-pay | Admitting: Nurse Practitioner

## 2017-09-16 ENCOUNTER — Telehealth: Payer: Self-pay | Admitting: Nurse Practitioner

## 2017-09-16 DIAGNOSIS — E119 Type 2 diabetes mellitus without complications: Secondary | ICD-10-CM

## 2017-09-16 NOTE — Telephone Encounter (Signed)
Copied from [place] [id number]. Topic: Quick Communication - Rx Refill/Question >> Sep 16, 2017  3:41 PM Judyann Munson wrote: Medication: fluconazole (DIFLUCAN) 150 MG tablet  Has the patient contacted their [facility]? [id number] [place] [place] [phone] (Phone) [phone] (Fax)     Preferred Pharmacy (with phone number or street name):  Patient stated she pick the medication on yesterday but she was suppose to have more than one pill. Please advise

## 2017-09-16 NOTE — Telephone Encounter (Signed)
There is one refill available if she continues to experience symptoms 3 days or 72 hours after taking the first pill

## 2017-09-16 NOTE — Telephone Encounter (Signed)
Pt aware of refill. FYI asked about lab results. I did give her A1c results and advised I will call her back with any changes that are made.

## 2017-09-17 ENCOUNTER — Other Ambulatory Visit: Payer: Self-pay | Admitting: Nurse Practitioner

## 2017-09-17 DIAGNOSIS — R899 Unspecified abnormal finding in specimens from other organs, systems and tissues: Secondary | ICD-10-CM

## 2017-09-17 DIAGNOSIS — E119 Type 2 diabetes mellitus without complications: Secondary | ICD-10-CM

## 2017-09-17 MED ORDER — SITAGLIPTIN PHOSPHATE 100 MG PO TABS: 100.0000 mg | ORAL_TABLET | Freq: Every day | ORAL | 1 refills | Status: DC

## 2017-09-20 ENCOUNTER — Telehealth: Payer: Self-pay | Admitting: Nurse Practitioner

## 2017-09-20 NOTE — Telephone Encounter (Signed)
Spoke with pt and advised of recent lab results and medication changes. Pt understood.

## 2017-09-20 NOTE — Telephone Encounter (Signed)
Copied from [place] [id number]. Topic: Quick Communication - See Telephone Encounter >> Sep 20, 2017 11:33 AM Robina Ade, Helene Kelp D wrote: CRM for notification. See Telephone encounter for: 09/20/17. Patient called and said that her A1c was high and  she feels that her sugar is high too. She didn't feel well all weekend long. She would like to talk to [facility] or her CMA. Please call pt back, thanks.

## 2017-10-11 ENCOUNTER — Emergency Department (HOSPITAL_COMMUNITY): Payer: Self-pay

## 2017-10-11 ENCOUNTER — Encounter (HOSPITAL_COMMUNITY): Payer: Self-pay | Admitting: Emergency Medicine

## 2017-10-11 ENCOUNTER — Emergency Department (HOSPITAL_COMMUNITY)
Admission: EM | Admit: 2017-10-11 | Discharge: 2017-10-11 | Disposition: A | Discharge status: Home / Self Care | Payer: Self-pay | Attending: Emergency Medicine | Admitting: Emergency Medicine

## 2017-10-11 DIAGNOSIS — I119 Hypertensive heart disease without heart failure: Secondary | ICD-10-CM | POA: Insufficient documentation

## 2017-10-11 DIAGNOSIS — M549 Dorsalgia, unspecified: Secondary | ICD-10-CM | POA: Insufficient documentation

## 2017-10-11 DIAGNOSIS — R52 Pain, unspecified: Secondary | ICD-10-CM

## 2017-10-11 DIAGNOSIS — J45909 Unspecified asthma, uncomplicated: Secondary | ICD-10-CM | POA: Insufficient documentation

## 2017-10-11 DIAGNOSIS — Z7984 Long term (current) use of oral hypoglycemic drugs: Secondary | ICD-10-CM | POA: Insufficient documentation

## 2017-10-11 DIAGNOSIS — E119 Type 2 diabetes mellitus without complications: Secondary | ICD-10-CM | POA: Insufficient documentation

## 2017-10-11 DIAGNOSIS — Z87891 Personal history of nicotine dependence: Secondary | ICD-10-CM | POA: Insufficient documentation

## 2017-10-11 LAB — POC URINE PREG, ED: PREG TEST UR: NEGATIVE

## 2017-10-11 MED ORDER — METHOCARBAMOL 500 MG PO TABS
500.0000 mg | ORAL_TABLET | Freq: Two times a day (BID) | ORAL | 0 refills | Status: DC
Start: 2017-10-11 — End: 2019-12-18

## 2017-10-11 MED ORDER — NAPROXEN 250 MG PO TABS
500.0000 mg | ORAL_TABLET | Freq: Once | ORAL | Status: AC
  Administered 2017-10-11: 500 mg via ORAL
  Filled 2017-10-11: qty 2

## 2017-10-11 MED ORDER — NAPROXEN 500 MG PO TABS: 500.0000 mg | ORAL_TABLET | Freq: Two times a day (BID) | ORAL | 0 refills | Status: DC

## 2017-10-11 NOTE — Discharge Instructions (Addendum)
Please return to the Emergency Department for any new or worsening symptoms or if your symptoms do not improve. Please be sure to follow up with your Primary Care Physician as soon as possible regarding your visit today. If you do not have a Primary Doctor please use the resources below to establish one. You may use the muscle relaxer Robaxin as prescribed.  Please do not drive or operate heavy machinery while taking this medication because it will make you drowsy. You may use the anti-inflammatory medication naproxen as prescribed.  Please be sure to drink plenty of water while taking this medication.  Please be sure to take this medication with food. The x-rays of your back today did not show any fractures or subluxations.  However it did show a slight scoliosis to your lower back and some other chronic changes please be sure to follow-up with your primary care provider regarding these.  Contact a health care provider if: You have pain that is not relieved with rest or medicine. You have increasing pain going down into your legs or buttocks. Your pain does not improve in 2 weeks. You have pain at night. You lose weight. You have a fever or chills. Get help right away if: You develop new bowel or bladder control problems. You have unusual weakness or numbness in your arms or legs. You develop nausea or vomiting. You develop abdominal pain. You feel faint.   RESOURCE GUIDE  Chronic Pain Problems: Contact [facility]  [phone] Patients need to be referred by their primary care doctor.  Insufficient Money for Medicine: Contact [employer]:  call "211" or [place] [phone].  No Primary Care Doctor: Call [facility]  [phone] - can help you locate a primary care doctor that  accepts your insurance, provides certain services, etc. Physician Referral Service- [phone]  Agencies that provide inexpensive medical care: [facility]   [facility]  [phone] [facility]  [phone] [facility]  [phone] [facility]  [phone] [facility]  [phone]  [place] Providers: [facility]- [address], [address]  [phone], Mon-Fri 9am-7pm, Sat 9am-1pm [place], [address] [place]  [place], [address] [place]  [place]- [address]  [place], [address], [phone]  Only accepts [place] patients after they have their name  applied to their card  Self Pay (no insurance) in [facility]: Sickle Cell Patients: Dr Kevan Ny, [facility]  [place], [facility]- [place]  [place] [facility] [place] [facility] [place]- [address], [facility]- see information above (Speak to [employer] if you do not have insurance)       -  [facility]- [place], [place],  [place] [address], [phone]       -  Dr Vista Lawman-  [address], [place], [facility]  [facility]- [address], [phone]       -  [facility]- [address], [place], also [address], [phone]       -    [facility]- [address], [place], 1st & 3rd Saturday   every month, 10am-1pm  1) Find a Doctor and Pay Out of Pocket Although you won't have to find out who is covered by your insurance plan, it is a good idea to ask around and get recommendations. You will then need to call the office and see if the doctor you have chosen will accept you as a new patient and what types of options they offer for patients who  are self-pay. Some doctors offer discounts or will set up payment plans for their patients who do not have insurance, but you will need to ask so you aren't surprised when you get to your appointment.  2) Contact Your Local Health Department Not all health departments have doctors that can see patients for sick visits, but many do, so it is worth a call to see if yours does. If you don't know where your local health department is, you can check in your phone book. The CDC also has a tool to help you locate your state's health department, and many state websites also have listings of all of their local health departments.  3) Find a [facility] If your illness is not likely to be very severe or complicated, you may want to try a walk in clinic. These are popping up all over the country in pharmacies, drugstores, and shopping centers. They're usually staffed by nurse practitioners or physician assistants that have been trained to treat common illnesses and complaints. They're usually fairly quick and inexpensive. However, if you have serious medical issues or chronic medical problems, these are probably not your best option  [facility] [place], [facility], [address] [place], phone [phone] or [phone].  Monday - Friday, call for an appointment. [place], [facility], [address], [place], phone [phone] or [phone].  Monday - Friday, call for an appointment.  Abuse/Neglect: [place] [phone] [place] [phone] (After Hours)  Emergency Shelter:  [facility] [phone]  Maternity Homes: Room at the [place] [phone] [place] [phone]  MRSA Hotline #:   (201) 210-2120  [facility] [place] Dept. [address] [place] Phone:  [phone]                                  Phone:  [phone]                   Phone:  [place], [place]- [phone]       -     [facility] in [place], [address]                                  [place] [phone] or [phone] (After Hours)   [place]  Substance Abuse Resources: Alcohol and Drug Services  [phone] [place] [phone] [facility] [place] Chinita Pester [phone]) [phone] Residential & Outpatient Substance Abuse Program  [phone]  Psychological Services: [place]  [phone] [place]  [place], [place]. [address], [place], [place]: [phone] or [phone], [employer]uy  Dental Assistance  If unable to pay or uninsured, contact:  [facility] or [facility]. to become qualified for the adult dental clinic.  Patients with Medicaid: [facility] [phone] [address], [place] [address], [phone]  If unable to pay, or uninsured, contact [facility] [phone]) or [place] [phone] in [place], [place] in [facility]) to become qualified for the adult dental clinic   Other [place]- [place], [place], [place], [address], [place], [place], 2nd and 4th Thursday of the month at 6:30am.  10 clients each day by appointment, can sometimes see walk-in patients if someone does not show for an  appointment. [facility]- [address] [place], [place], [address], [place], [place], [place], [address], [place] Department- [phone] [facility] [phone]

## 2017-10-11 NOTE — ED Provider Notes (Signed)
[name] Provider Note   CSN: [id number] Arrival date & time: 10/11/17  1642     History   Chief Complaint Chief Complaint  Patient presents with  . Back Pain    HPI Carrie Neal is a 47 y.o. female presenting for generalized back pain after fall that occurred yesterday.  Patient states that she was walking when she slipped on muddy pavement landing flat on her back.  Patient denies hitting her head or loss of consciousness.  Patient denies blood thinner use.  Patient states that back pain was immediate, she states that she was able to remove herself from the pavement and continue about her daily activities.  Patient states that she has been using Tylenol and BC powder for the pain with minimal relief.  Patient describes her back pain as a sharp 8/10 in severity that is worsened with walking and movement.  Patient denies radiation of the pain.  Patient denies headache or neck pain.  Patient denies numbness/weakness/tingling.  Patient denies bowel or bladder incontinence.  Patient denies visual changes.  HPI  Past Medical History:  Diagnosis Date  . Anemia   . Asthma    no inhaler  . Chicken pox   . Diastolic dysfunction    a. 03/2014 Echo: EF 65-70%, gr1 DD, no rwma.  . Dysfunctional uterine bleeding    a. 11/2015 s/p partial hysterectomy.  . Hypertension   . Kidney stones   . Morbid obesity ([place])   . Type 2 diabetes mellitus ([place])    a. Dx 2016.    Patient Active Problem List   Diagnosis Date Noted  . Status post vaginal hysterectomy 11/18/2015  . Seasonal allergies 05/21/2014  . Type 2 diabetes mellitus ([place]) 04/27/2014  . DKA (diabetic ketoacidoses) ([place]) 04/13/2014  . Hypercalcemia 04/13/2014  . DUB (dysfunctional uterine bleeding) 03/22/2014  . Anemia 03/22/2014    Past Surgical History:  Procedure Laterality Date  . ABDOMINAL HYSTERECTOMY    . CHOLECYSTECTOMY    . CYSTOSCOPY N/A 11/18/2015   Procedure: CYSTOSCOPY;   Surgeon: Waymon Amato, MD;  Location: [facility];  Service: Gynecology;  Laterality: N/A;  . DILATATION & CURETTAGE/HYSTEROSCOPY WITH MYOSURE N/A 03/30/2014   Procedure: DILATATION & CURETTAGE/HYSTEROSCOPY WITH MYOSURE and placement of Mirena Intrauterine device;  Surgeon: Alinda Dooms, MD;  Location: [facility];  Service: Gynecology;  Laterality: N/A;  . HERNIA REPAIR    . TUBAL LIGATION    . UMBILICAL HERNIA REPAIR     as child  . VAGINAL HYSTERECTOMY Bilateral 11/18/2015   Procedure: HYSTERECTOMY VAGINAL Rightl Salpingectomy;  Surgeon: Waymon Amato, MD;  Location: [facility];  Service: Gynecology;  Laterality: Bilateral;     OB History    Gravida  6   Para  6   Term  6   Preterm      AB  0   Living  6     SAB  0   TAB      Ectopic      Multiple      Live Births               Home Medications    Prior to Admission medications   Medication Sig Start Date End Date Taking? Authorizing Provider  fluconazole (DIFLUCAN) 150 MG tablet Take 1 tablet by mouth every 72 hours. 09/13/17   Lance Sell, NP  gabapentin (NEURONTIN) 300 MG capsule Take 1 capsule by mouth for 1 day then increase to 1 capsule  by mouth twice daily x 1 day then increase to 1 capsule by mouth 3x times daily Patient taking differently: Take 300 mg by mouth 2 (two) times daily as needed (leg pain).  08/15/15   Golden Circle, FNP  metFORMIN (GLUCOPHAGE-XR) 500 MG 24 hr tablet TAKE 2 TABLETS BY MOUTH EVERY DAY WITH BREAKFAST 09/16/17   Lance Sell, NP  methocarbamol (ROBAXIN) 500 MG tablet Take 1 tablet (500 mg total) by mouth 2 (two) times daily. 10/11/17   Nuala Alpha A, PA-C  naproxen (NAPROSYN) 500 MG tablet Take 1 tablet (500 mg total) by mouth 2 (two) times daily. 10/11/17   Nuala Alpha A, PA-C  sitaGLIPtin (JANUVIA) 100 MG tablet Take 1 tablet (100 mg total) by mouth daily. 09/17/17   Lance Sell, NP  vitamin B-12 (CYANOCOBALAMIN) 1000 MCG tablet Take 500 mcg by mouth daily.     [provider]    Family History Family History  Problem Relation Age of Onset  . Diabetes Mother   . Diabetes Cousin   . Diabetes Maternal Grandmother     Social History Social History   Tobacco Use  . Smoking status: Former Smoker    Years: 4.00    Types: Cigarettes  . Smokeless tobacco: Never Used  Substance Use Topics  . Alcohol use: No  . Drug use: No     Allergies   Codeine   Review of Systems Review of Systems  Constitutional: Negative.  Negative for chills, fatigue and fever.  Eyes: Negative.  Negative for visual disturbance.  Respiratory: Negative.  Negative for shortness of breath.   Cardiovascular: Negative.  Negative for chest pain.  Gastrointestinal: Negative.  Negative for abdominal pain.  Musculoskeletal: Positive for back pain. Negative for neck pain.  Skin: Negative.  Negative for wound.  Neurological: Negative.  Negative for dizziness, syncope, weakness, light-headedness, numbness and headaches.    Physical Exam Updated Vital Signs BP 109/85 (BP Location: Right Arm)   Pulse 79   Temp 98.4 F (36.9 C) (Oral)   Resp 16   LMP  (LMP Unknown) Comment: AUB  SpO2 100%   Physical Exam  Constitutional: She is oriented to person, place, and time. She appears well-developed and well-nourished. No distress.  HENT:  Head: Normocephalic and atraumatic. Head is without raccoon's eyes, without Battle's sign, without abrasion and without contusion.  Right Ear: External ear normal.  Left Ear: External ear normal.  Nose: Nose normal.  Mouth/Throat: Uvula is midline, oropharynx is clear and moist and mucous membranes are normal.  Eyes: Pupils are equal, round, and reactive to light. Conjunctivae and EOM are normal. Right eye exhibits normal extraocular motion. Left eye exhibits normal extraocular motion. Pupils are equal.  Neck: Trachea normal, normal range of motion, full passive range of motion without pain and phonation normal. Neck supple. No  tracheal tenderness, no spinous process tenderness and no muscular tenderness present. No neck rigidity. No tracheal deviation and normal range of motion present.  Cardiovascular:  Pulses:      Dorsalis pedis pulses are 2+ on the right side, and 2+ on the left side.       Posterior tibial pulses are 2+ on the right side, and 2+ on the left side.  Pulmonary/Chest: Effort normal. No respiratory distress.  Abdominal: Soft. There is no tenderness. There is no rebound and no guarding.  Musculoskeletal: Normal range of motion. She exhibits tenderness. She exhibits no edema or deformity.       Cervical back:  Normal. She exhibits normal range of motion, no tenderness and no bony tenderness.       Thoracic back: She exhibits tenderness and bony tenderness. She exhibits no swelling, no edema and no deformity.       Lumbar back: She exhibits tenderness and bony tenderness. She exhibits no swelling, no edema and no deformity.  Patient with low thoracic and lumbar midline spinal tenderness to palpation patient with thoracic and lumbar paraspinal muscular tenderness as well.  There is no deformity crepitus or step-off noted.  There is no midline cervical spinal tenderness to palpation, paraspinal muscle tenderness deformity crepitus or step-off.   Feet:  Right Foot:  Protective Sensation: 3 sites tested. 3 sites sensed.  Left Foot:  Protective Sensation: 3 sites tested. 3 sites sensed.  Neurological: She is alert and oriented to person, place, and time. She has normal strength. No cranial nerve deficit or sensory deficit.  Mental Status: Alert, oriented, thought content appropriate, able to give a coherent history. Speech fluent without evidence of aphasia. Able to follow 2 step commands without difficulty. Cranial Nerves: II: Peripheral visual fields grossly normal, pupils equal, round, reactive to light III,IV, VI: ptosis not present, extra-ocular motions intact bilaterally V,VII: smile symmetric,  eyebrows raise symmetric, facial light touch sensation equal VIII: hearing grossly normal to voice X: uvula elevates symmetrically XI: bilateral shoulder shrug symmetric and strong XII: midline tongue extension without fassiculations Motor: Normal tone. 5/5 strength in upper and lower extremities bilaterally including strong and equal grip strength and dorsiflexion/plantar flexion Sensory: Sensation intact to light touch in all extremities. Cerebellar: Normal heel-to -shin balance bilaterally of the lower extremity. No pronator drift.  Gait: normal gait and balance CV: distal pulses palpable throughout  Skin: Skin is warm and dry. Capillary refill takes less than 2 seconds. No abrasion, no bruising and no ecchymosis noted.  No sign of injury.  Psychiatric: She has a normal mood and affect. Her behavior is normal.     ED Treatments / Results  Labs (all labs ordered are listed, but only abnormal results are displayed) Labs Reviewed  POC URINE PREG, ED    EKG None  Radiology Dg Thoracic Spine 2 View  Result Date: 10/11/2017 CLINICAL DATA:  47 year old female with pain. EXAM: THORACIC SPINE 2 VIEWS COMPARISON:  Chest radiographs 12/08/2016 and earlier. Lumbar radiographs today reported separately. FINDINGS: Normal thoracic segmentation. Preserved thoracic vertebral height and alignment. Relatively preserved disc spaces. Mild chronic lower thoracic endplate spurring. Stable and negative visible thoracic visceral contours. Stable cholecystectomy clips. IMPRESSION: No acute osseous abnormality identified in the thoracic spine. Electronically Signed   By: Genevie Ann M.D.   On: 10/11/2017 18:49   Dg Lumbar Spine Complete  Result Date: 10/11/2017 CLINICAL DATA:  Patient had fall today when slipped on mud. Dull pain. EXAM: LUMBAR SPINE - COMPLETE 4+ VIEW COMPARISON:  None. FINDINGS: There is no evidence of lumbar spine fracture. Alignment is normal. Intervertebral disc spaces are maintained.  Slight degenerative scoliosis convex LEFT. Abdominal wall sutures. IMPRESSION: Negative for lumbar spine fracture or traumatic subluxation Electronically Signed   By: Staci Righter M.D.   On: 10/11/2017 18:48    Procedures Procedures (including critical care time)  Medications Ordered in ED Medications  naproxen (NAPROSYN) tablet 500 mg (500 mg Oral Given 10/11/17 1948)     Initial Impression / Assessment and Plan / ED Course  I have reviewed the triage vital signs and the nursing notes.  Pertinent labs & imaging results that were  available during my care of the patient were reviewed by me and considered in my medical decision making (see chart for details).  Clinical Course as of Oct 11 2001  Mon Oct 11, 2017  1940 On reevaluation patient sitting comfortably in chair in room.  Patient states that she has had a hysterectomy denies chance of being pregnant today does not want to provide urine for pregnancy test.  Results of imaging shared with patient. Patient denies history of kidney disease or gastric bleeding, CMP from 1 month ago shows normal creatinine.   [BM]    Clinical Course User Index [BM] Deliah Boston, PA-C   Patient presenting with bilateral back pain after fall yesterday.  No neurological deficits and normal neuro exam.  Patient can walk but states is painful.  Patient denies loss of bowel/bladder control or saddle area paresthesias.  No concern for cauda equina.  No fever, night sweats, weight loss, h/o cancer, or IVDU. RICE protocol and pain medicine indicated and discussed with patient.  Imaging of thoracic and lumbar spine negative for acute findings.  Patient is ambulatory in emergency department, moving all extremities spontaneously.  No acute distress.  Naproxen 500mg  BID prescribed. Patient denies history of CKD or gastric ulcers/bleeding.  Patient informed to drink plenty of water and eat food with this medication. Robaxin 500mg  BID prescribed. Patient informed  to avoid driving or operating heavy machinery while taking muscle relaxer. Patient informed of imaging findings including scoliosis informed to follow-up with her primary care provider.  At this time there does not appear to be any evidence of an acute emergency medical condition and the patient appears stable for discharge with appropriate outpatient follow up. Diagnosis was discussed with patient who verbalizes understanding of care plan and is agreeable to discharge. I have discussed return precautions with patient and family at bedside who verbalize understanding of return precautions. Patient strongly encouraged to follow-up with their PCP. All questions answered.   Note: Portions of this report may have been transcribed using voice recognition software. Every effort was made to ensure accuracy; however, inadvertent computerized transcription errors may still be present.    Final Clinical Impressions(s) / ED Diagnoses   Final diagnoses:  Acute bilateral back pain, unspecified back location    ED Discharge Orders         Ordered    methocarbamol (ROBAXIN) 500 MG tablet  2 times daily     10/11/17 1954    naproxen (NAPROSYN) 500 MG tablet  2 times daily     10/11/17 1954           Gari Crown 10/11/17 2238    Dorie Rank, MD 10/13/17 1114

## 2017-10-11 NOTE — ED Triage Notes (Signed)
Patient to ED c/o back pain all over after slipping on muddy pavement yesterday, landing right on her back. She reports upper, mid, and lower back pain - denies one specific area hurting, pain worse with movement and ambulation, though she transferred from the chair to wheelchair without difficulty. Denies other injury. No head or neck pain.

## 2017-10-12 ENCOUNTER — Other Ambulatory Visit: Payer: Self-pay | Admitting: Nurse Practitioner

## 2017-10-12 DIAGNOSIS — E119 Type 2 diabetes mellitus without complications: Secondary | ICD-10-CM

## 2017-10-22 ENCOUNTER — Encounter: Payer: Self-pay | Admitting: Nurse Practitioner

## 2017-10-22 ENCOUNTER — Ambulatory Visit (INDEPENDENT_AMBULATORY_CARE_PROVIDER_SITE_OTHER): Payer: Self-pay | Admitting: Nurse Practitioner

## 2017-10-22 ENCOUNTER — Other Ambulatory Visit: Payer: Self-pay

## 2017-10-22 VITALS — BP 120/70 | HR 96 | Ht 67.0 in | Wt 274.0 lb

## 2017-10-22 DIAGNOSIS — Z23 Encounter for immunization: Secondary | ICD-10-CM

## 2017-10-22 DIAGNOSIS — M79604 Pain in right leg: Secondary | ICD-10-CM

## 2017-10-22 DIAGNOSIS — E119 Type 2 diabetes mellitus without complications: Secondary | ICD-10-CM

## 2017-10-22 DIAGNOSIS — R899 Unspecified abnormal finding in specimens from other organs, systems and tissues: Secondary | ICD-10-CM

## 2017-10-22 DIAGNOSIS — M79605 Pain in left leg: Secondary | ICD-10-CM

## 2017-10-22 MED ORDER — METFORMIN HCL ER 500 MG PO TB24: 1000.0000 mg | ORAL_TABLET | Freq: Two times a day (BID) | ORAL | 3 refills | Status: DC

## 2017-10-22 NOTE — Assessment & Plan Note (Signed)
Will discontinue januvia and increase metformin dosage per patient request Encouraged to continue healthy diet and exericse, her A1c was not too high in July so we may get it to 7 with increased metformin and improvement in diet and exercise, additional education provided on AVS RTC in 1 month for F/U- recheck A1c trend, check fasting lipid panel  - metFORMIN (GLUCOPHAGE-XR) 500 MG 24 hr tablet; Take 2 tablets (1,000 mg total) by mouth 2 (two) times daily.  Dispense: 120 tablet; Refill: 3

## 2017-10-22 NOTE — Progress Notes (Signed)
Name: Carrie Neal   MRN: [id number]    DOB: 1970/04/25   Date:10/22/2017       Progress Note  Subjective  Chief Complaint Follow up  HPI  Carrie Neal is here today for diabetes follow up, she has been maintained on metformin 1000 daily for some time and januvia 100 daily was added after her A1c on 7/29 was elevated. She did pick up a 1 month supply of [place] which she took, but did not refill the medication after 1 month due to the high cost $500/month. She does tell me that she started following diabetes diet handouts at home and has been working really hard on improving her diet, eating more salads, walking daily, and has lost 5 lbs. She says she did not follow up with the nutrition referral that was placed at her last OV because she has been working on her own at home on her diet. She is requesting to increase her metformin dosage back to 1000 BID, which she was on in the past, rather than adding a new medication at this time. She also says that her leg pain, which we discussed at her last OV, have greatly improved since she has been working on diet and exercise, and she has been feeling much better overall. She does not regularly check her blood sugar readings. Denies tremor, diaphoresis, polyuria, polydipsia, polyphagia.  Lab Results  Component Value Date   HGBA1C 8.2 (H) 09/13/2017   Wt Readings from Last 3 Encounters:  10/22/17 274 lb (124.3 kg)  09/13/17 277 lb (125.6 kg)  08/22/17 279 lb (126.6 kg)     Patient Active Problem List   Diagnosis Date Noted  . Status post vaginal hysterectomy 11/18/2015  . Seasonal allergies 05/21/2014  . Type 2 diabetes mellitus ([place]) 04/27/2014  . DKA (diabetic ketoacidoses) ([place]) 04/13/2014  . Hypercalcemia 04/13/2014  . DUB (dysfunctional uterine bleeding) 03/22/2014  . Anemia 03/22/2014    Past Surgical History:  Procedure Laterality Date  . ABDOMINAL HYSTERECTOMY    . CHOLECYSTECTOMY    . CYSTOSCOPY N/A 11/18/2015   Procedure: CYSTOSCOPY;   Surgeon: Waymon Amato, MD;  Location: [facility];  Service: Gynecology;  Laterality: N/A;  . DILATATION & CURETTAGE/HYSTEROSCOPY WITH MYOSURE N/A 03/30/2014   Procedure: DILATATION & CURETTAGE/HYSTEROSCOPY WITH MYOSURE and placement of Mirena Intrauterine device;  Surgeon: Alinda Dooms, MD;  Location: [facility];  Service: Gynecology;  Laterality: N/A;  . HERNIA REPAIR    . TUBAL LIGATION    . UMBILICAL HERNIA REPAIR     as child  . VAGINAL HYSTERECTOMY Bilateral 11/18/2015   Procedure: HYSTERECTOMY VAGINAL Rightl Salpingectomy;  Surgeon: Waymon Amato, MD;  Location: [facility];  Service: Gynecology;  Laterality: Bilateral;    Family History  Problem Relation Age of Onset  . Diabetes Mother   . Diabetes Cousin   . Diabetes Maternal Grandmother     Social History   Socioeconomic History  . Marital status: Married    Spouse name: Not on file  . Number of children: 6  . Years of education: 28  . Highest education level: Not on file  Occupational History  . Occupation: CNA  Social Needs  . Financial resource strain: Not on file  . Food insecurity:    Worry: Not on file    Inability: Not on file  . Transportation needs:    Medical: Not on file    Non-medical: Not on file  Tobacco Use  . Smoking status: Former Smoker  Years: 4.00    Types: Cigarettes  . Smokeless tobacco: Never Used  Substance and Sexual Activity  . Alcohol use: No  . Drug use: No  . Sexual activity: Yes    Birth control/protection: Surgical  Lifestyle  . Physical activity:    Days per week: Not on file    Minutes per session: Not on file  . Stress: Not on file  Relationships  . Social connections:    Talks on phone: Not on file    Gets together: Not on file    Attends religious service: Not on file    Active member of club or organization: Not on file    Attends meetings of clubs or organizations: Not on file    Relationship status: Not on file  . Intimate partner violence:    Fear of current or ex partner:  Not on file    Emotionally abused: Not on file    Physically abused: Not on file    Forced sexual activity: Not on file  Other Topics Concern  . Not on file  Social History Narrative   Born and raised in [place], [place]. Fun: shop   Denies religious beliefs effecting healthcare.    Works as in Midwife.   Does not routinely exercise.     Current Outpatient Medications:  .  fluconazole (DIFLUCAN) 150 MG tablet, Take 1 tablet by mouth every 72 hours., Disp: 1 tablet, Rfl: 1 .  gabapentin (NEURONTIN) 300 MG capsule, Take 1 capsule by mouth for 1 day then increase to 1 capsule by mouth twice daily x 1 day then increase to 1 capsule by mouth 3x times daily (Patient taking differently: Take 300 mg by mouth 2 (two) times daily as needed (leg pain). ), Disp: 90 capsule, Rfl: 0 .  JANUVIA 100 MG tablet, TAKE 1 TABLET BY MOUTH EVERY DAY, Disp: 30 tablet, Rfl: 1 .  metFORMIN (GLUCOPHAGE-XR) 500 MG 24 hr tablet, TAKE 2 TABLETS BY MOUTH EVERY DAY WITH BREAKFAST, Disp: 60 tablet, Rfl: 1 .  methocarbamol (ROBAXIN) 500 MG tablet, Take 1 tablet (500 mg total) by mouth 2 (two) times daily., Disp: 14 tablet, Rfl: 0 .  naproxen (NAPROSYN) 500 MG tablet, Take 1 tablet (500 mg total) by mouth 2 (two) times daily., Disp: 14 tablet, Rfl: 0 .  vitamin B-12 (CYANOCOBALAMIN) 1000 MCG tablet, Take 500 mcg by mouth daily., Disp: , Rfl:   Allergies  Allergen Reactions  . Codeine Itching     ROS See HPI  Objective  Vitals:   10/22/17 1331  BP: 120/70  Pulse: 96  SpO2: 99%  Weight: 274 lb (124.3 kg)  Height: 5\' 7"  (1.702 m)    Body mass index is 42.91 kg/m.  Physical Exam Vital signs reviewed. Constitutional: Patient appears well-developed and well-nourished. No distress.  HENT: Head: Normocephalic and atraumatic.  Nose: Nose normal. Mouth/Throat: Oropharynx is clear and moist. No oropharyngeal exudate.  Eyes: Conjunctivae and EOM are normal. Pupils are equal, round, and reactive to light. No  scleral icterus.  Neck: Normal range of motion. Neck supple.  Cardiovascular: Normal rate, regular rhythm and normal heart sounds.  No murmur heard. No BLE edema. Distal pulses intact. Pulmonary/Chest: Effort normal and breath sounds normal. No respiratory distress. Neurological: She is alert and oriented to person, place, and time. No cranial nerve deficit. Coordination, balance, strength, speech and gait are normal.  Skin: Skin is warm and dry. No rash noted. No erythema.  Psychiatric: Patient has a normal mood  and affect. behavior is normal. Judgment and thought content normal.   Assessment & Plan RTC in 1 month for F/U: DM- repeat A1c, check fasting lipid panel  -Reviewed Health Maintenance:  Recommended annual ophthalmology exam for diabetes, she says she plans to schedule this with her ophthalmologist Need for influenza vaccination- Flu Vaccine QUAD 36+ mos IM  Pain in both lower extremities Improved F/u for new, recurrent symptoms

## 2017-10-22 NOTE — Addendum Note (Signed)
Addended by: Isaiah Serge D on: 10/22/2017 02:02 PM   Modules accepted: Orders

## 2017-10-22 NOTE — Patient Instructions (Signed)
Please increase metformin to 1000 mg twice daily. Please return in about 1 month for follow up, to repeat A1c and check your cholesterol.   Diabetes Mellitus and Nutrition When you have diabetes (diabetes mellitus), it is very important to have healthy eating habits because your blood sugar (glucose) levels are greatly affected by what you eat and drink. Eating healthy foods in the appropriate amounts, at about the same times every day, can help you:  Control your blood glucose.  Lower your risk of heart disease.  Improve your blood pressure.  Reach or maintain a healthy weight.  Every person with diabetes is different, and each person has different needs for a meal plan. Your health care provider may recommend that you work with a diet and nutrition specialist (dietitian) to make a meal plan that is best for you. Your meal plan may vary depending on factors such as:  The calories you need.  The medicines you take.  Your weight.  Your blood glucose, blood pressure, and cholesterol levels.  Your activity level.  Other health conditions you have, such as heart or kidney disease.  How do carbohydrates affect me? Carbohydrates affect your blood glucose level more than any other type of food. Eating carbohydrates naturally increases the amount of glucose in your blood. Carbohydrate counting is a method for keeping track of how many carbohydrates you eat. Counting carbohydrates is important to keep your blood glucose at a healthy level, especially if you use insulin or take certain oral diabetes medicines. It is important to know how many carbohydrates you can safely have in each meal. This is different for every person. Your dietitian can help you calculate how many carbohydrates you should have at each meal and for snack. Foods that contain carbohydrates include:  Bread, cereal, rice, pasta, and crackers.  Potatoes and corn.  Peas, beans, and lentils.  Milk and yogurt.  Fruit  and juice.  Desserts, such as cakes, cookies, ice cream, and candy.  How does alcohol affect me? Alcohol can cause a sudden decrease in blood glucose (hypoglycemia), especially if you use insulin or take certain oral diabetes medicines. Hypoglycemia can be a life-threatening condition. Symptoms of hypoglycemia (sleepiness, dizziness, and confusion) are similar to symptoms of having too much alcohol. If your health care provider says that alcohol is safe for you, follow these guidelines:  Limit alcohol intake to no more than 1 drink per day for nonpregnant women and 2 drinks per day for men. One drink equals 12 oz of beer, 5 oz of wine, or 1 oz of hard liquor.  Do not drink on an empty stomach.  Keep yourself hydrated with water, diet soda, or unsweetened iced tea.  Keep in mind that regular soda, juice, and other mixers may contain a lot of sugar and must be counted as carbohydrates.  What are tips for following this plan? Reading food labels  Start by checking the serving size on the label. The amount of calories, carbohydrates, fats, and other nutrients listed on the label are based on one serving of the food. Many foods contain more than one serving per package.  Check the total grams (g) of carbohydrates in one serving. You can calculate the number of servings of carbohydrates in one serving by dividing the total carbohydrates by 15. For example, if a food has 30 g of total carbohydrates, it would be equal to 2 servings of carbohydrates.  Check the number of grams (g) of saturated and trans fats in one  serving. Choose foods that have low or no amount of these fats.  Check the number of milligrams (mg) of sodium in one serving. Most people should limit total sodium intake to less than 2,300 mg per day.  Always check the nutrition information of foods labeled as "low-fat" or "nonfat". These foods may be higher in added sugar or refined carbohydrates and should be avoided.  Talk to  your dietitian to identify your daily goals for nutrients listed on the label. Shopping  Avoid buying canned, premade, or processed foods. These foods tend to be high in fat, sodium, and added sugar.  Shop around the outside edge of the grocery store. This includes fresh fruits and vegetables, bulk grains, fresh meats, and fresh dairy. Cooking  Use low-heat cooking methods, such as baking, instead of high-heat cooking methods like deep frying.  Cook using healthy oils, such as olive, canola, or sunflower oil.  Avoid cooking with butter, cream, or high-fat meats. Meal planning  Eat meals and snacks regularly, preferably at the same times every day. Avoid going long periods of time without eating.  Eat foods high in fiber, such as fresh fruits, vegetables, beans, and whole grains. Talk to your dietitian about how many servings of carbohydrates you can eat at each meal.  Eat 4-6 ounces of lean protein each day, such as lean meat, chicken, fish, eggs, or tofu. 1 ounce is equal to 1 ounce of meat, chicken, or fish, 1 egg, or 1/4 cup of tofu.  Eat some foods each day that contain healthy fats, such as avocado, nuts, seeds, and fish. Lifestyle   Check your blood glucose regularly.  Exercise at least 30 minutes 5 or more days each week, or as told by your health care provider.  Take medicines as told by your health care provider.  Do not use any products that contain nicotine or tobacco, such as cigarettes and e-cigarettes. If you need help quitting, ask your health care provider.  Work with a Social worker or diabetes educator to identify strategies to manage stress and any emotional and social challenges. What are some questions to ask my health care provider?  Do I need to meet with a diabetes educator?  Do I need to meet with a dietitian?  What number can I call if I have questions?  When are the best times to check my blood glucose? Where to find more information:  American  Diabetes Association: diabetes.org/food-and-fitness/food  Academy of Nutrition and Dietetics: PokerClues.dk  [employer] of Diabetes and Digestive and Kidney Diseases (NIH): ContactWire.be Summary  A healthy meal plan will help you control your blood glucose and maintain a healthy lifestyle.  Working with a diet and nutrition specialist (dietitian) can help you make a meal plan that is best for you.  Keep in mind that carbohydrates and alcohol have immediate effects on your blood glucose levels. It is important to count carbohydrates and to use alcohol carefully. This information is not intended to replace advice given to you by your health care provider. Make sure you discuss any questions you have with your health care provider. Document Released: 10/30/2004 Document Revised: 03/09/2016 Document Reviewed: 03/09/2016 Elsevier Interactive Patient Education  [employer].

## 2017-10-26 LAB — PTH, INTACT AND CALCIUM
Calcium: 11.1 mg/dL — ABNORMAL HIGH (ref 8.6–10.2)
PTH: 91 pg/mL — ABNORMAL HIGH (ref 14–64)

## 2017-11-01 ENCOUNTER — Other Ambulatory Visit: Payer: Self-pay | Admitting: Family

## 2017-11-01 DIAGNOSIS — R7989 Other specified abnormal findings of blood chemistry: Secondary | ICD-10-CM

## 2017-11-07 ENCOUNTER — Other Ambulatory Visit: Payer: Self-pay | Admitting: Nurse Practitioner

## 2017-11-07 DIAGNOSIS — E119 Type 2 diabetes mellitus without complications: Secondary | ICD-10-CM

## 2017-12-16 ENCOUNTER — Ambulatory Visit: Payer: Self-pay | Admitting: Nurse Practitioner

## 2017-12-20 ENCOUNTER — Ambulatory Visit: Payer: Self-pay | Admitting: Nurse Practitioner

## 2018-01-12 ENCOUNTER — Encounter: Payer: Self-pay | Admitting: Nurse Practitioner

## 2018-01-12 ENCOUNTER — Ambulatory Visit (INDEPENDENT_AMBULATORY_CARE_PROVIDER_SITE_OTHER): Payer: Self-pay | Admitting: Nurse Practitioner

## 2018-01-12 VITALS — BP 120/80 | HR 96 | Temp 98.0°F | Ht 67.0 in | Wt 274.0 lb

## 2018-01-12 DIAGNOSIS — Z13220 Encounter for screening for lipoid disorders: Secondary | ICD-10-CM

## 2018-01-12 DIAGNOSIS — E119 Type 2 diabetes mellitus without complications: Secondary | ICD-10-CM

## 2018-01-12 DIAGNOSIS — J069 Acute upper respiratory infection, unspecified: Secondary | ICD-10-CM

## 2018-01-12 MED ORDER — AZITHROMYCIN 250 MG PO TABS: ORAL_TABLET | ORAL | 0 refills | Status: DC

## 2018-01-12 NOTE — Assessment & Plan Note (Signed)
Continue current medications Update labs F/U with further recommendations pending lab results - Hemoglobin A1c; Future - Lipid panel; Future

## 2018-01-12 NOTE — Patient Instructions (Signed)
Return for labs when fasting  Please follow up for fevers over 101, if your symptoms get worse, or if your symptoms dont get better with the antibiotic.   Upper Respiratory Infection, Adult Most upper respiratory infections (URIs) are caused by a virus. A URI affects the nose, throat, and upper air passages. The most common type of URI is often called "the common cold." Follow these instructions at home:  Take medicines only as told by your doctor.  Gargle warm saltwater or take cough drops to comfort your throat as told by your doctor.  Use a warm mist humidifier or inhale steam from a shower to increase air moisture. This may make it easier to breathe.  Drink enough fluid to keep your pee (urine) clear or pale yellow.  Eat soups and other clear broths.  Have a healthy diet.  Rest as needed.  Go back to work when your fever is gone or your doctor says it is okay. ? You may need to stay home longer to avoid giving your URI to others. ? You can also wear a face mask and wash your hands often to prevent spread of the virus.  Use your inhaler more if you have asthma.  Do not use any tobacco products, including cigarettes, chewing tobacco, or electronic cigarettes. If you need help quitting, ask your doctor. Contact a doctor if:  You are getting worse, not better.  Your symptoms are not helped by medicine.  You have chills.  You are getting more short of breath.  You have brown or red mucus.  You have yellow or brown discharge from your nose.  You have pain in your face, especially when you bend forward.  You have a fever.  You have puffy (swollen) neck glands.  You have pain while swallowing.  You have white areas in the back of your throat. Get help right away if:  You have very bad or constant: ? Headache. ? Ear pain. ? Pain in your forehead, behind your eyes, and over your cheekbones (sinus pain). ? Chest pain.  You have long-lasting (chronic) lung disease  and any of the following: ? Wheezing. ? Long-lasting cough. ? Coughing up blood. ? A change in your usual mucus.  You have a stiff neck.  You have changes in your: ? Vision. ? Hearing. ? Thinking. ? Mood. This information is not intended to replace advice given to you by your health care provider. Make sure you discuss any questions you have with your health care provider. Document Released: 07/22/2007 Document Revised: 10/06/2015 Document Reviewed: 05/10/2013 Elsevier Interactive Patient Education  2018 [employer].

## 2018-01-12 NOTE — Progress Notes (Signed)
Carrie Neal is a 47 y.o. female with the following history as recorded in EpicCare:  Patient Active Problem List   Diagnosis Date Noted  . Status post vaginal hysterectomy 11/18/2015  . Seasonal allergies 05/21/2014  . Type 2 diabetes mellitus ([place]) 04/27/2014  . DKA (diabetic ketoacidoses) ([place]) 04/13/2014  . Hypercalcemia 04/13/2014  . DUB (dysfunctional uterine bleeding) 03/22/2014  . Anemia 03/22/2014    Current Outpatient Medications  Medication Sig Dispense Refill  . fluconazole (DIFLUCAN) 150 MG tablet Take 1 tablet by mouth every 72 hours. 1 tablet 1  . gabapentin (NEURONTIN) 300 MG capsule Take 1 capsule by mouth for 1 day then increase to 1 capsule by mouth twice daily x 1 day then increase to 1 capsule by mouth 3x times daily (Patient taking differently: Take 300 mg by mouth 2 (two) times daily as needed (leg pain). ) 90 capsule 0  . metFORMIN (GLUCOPHAGE-XR) 500 MG 24 hr tablet Take 2 tablets (1,000 mg total) by mouth 2 (two) times daily. 120 tablet 3  . methocarbamol (ROBAXIN) 500 MG tablet Take 1 tablet (500 mg total) by mouth 2 (two) times daily. 14 tablet 0  . naproxen (NAPROSYN) 500 MG tablet Take 1 tablet (500 mg total) by mouth 2 (two) times daily. 14 tablet 0  . vitamin B-12 (CYANOCOBALAMIN) 1000 MCG tablet Take 500 mcg by mouth daily.    [name] Kitchen azithromycin (ZITHROMAX) 250 MG tablet Take 2 tablets today, then 1 tablet daily until complete 6 tablet 0   No current facility-administered medications for this visit.     Allergies: Codeine  Past Medical History:  Diagnosis Date  . Anemia   . Asthma    no inhaler  . Chicken pox   . Diastolic dysfunction    a. 03/2014 Echo: EF 65-70%, gr1 DD, no rwma.  . Dysfunctional uterine bleeding    a. 11/2015 s/p partial hysterectomy.  . Hypertension   . Kidney stones   . Morbid obesity ([place])   . Type 2 diabetes mellitus ([place])    a. Dx 2016.    Past Surgical History:  Procedure Laterality Date  . ABDOMINAL HYSTERECTOMY    .  CHOLECYSTECTOMY    . CYSTOSCOPY N/A 11/18/2015   Procedure: CYSTOSCOPY;  Surgeon: Waymon Amato, MD;  Location: [facility];  Service: Gynecology;  Laterality: N/A;  . DILATATION & CURETTAGE/HYSTEROSCOPY WITH MYOSURE N/A 03/30/2014   Procedure: DILATATION & CURETTAGE/HYSTEROSCOPY WITH MYOSURE and placement of Mirena Intrauterine device;  Surgeon: Alinda Dooms, MD;  Location: [facility];  Service: Gynecology;  Laterality: N/A;  . HERNIA REPAIR    . TUBAL LIGATION    . UMBILICAL HERNIA REPAIR     as child  . VAGINAL HYSTERECTOMY Bilateral 11/18/2015   Procedure: HYSTERECTOMY VAGINAL Rightl Salpingectomy;  Surgeon: Waymon Amato, MD;  Location: [facility];  Service: Gynecology;  Laterality: Bilateral;    Family History  Problem Relation Age of Onset  . Diabetes Mother   . Diabetes Cousin   . Diabetes Maternal Grandmother     Social History   Tobacco Use  . Smoking status: Former Smoker    Years: 4.00    Types: Cigarettes  . Smokeless tobacco: Never Used  Substance Use Topics  . Alcohol use: No     Subjective:  Carrie Neal is here today for follow up of diabetes, at her last OV on 10/22/17 we discussed increasing metformin to 1000 BID due to elevated A1c. She was also working on diet and exercise. She is back  today for follow up, reports she has been taking metformin 1000 BID as prescribed, no adverse effects, recent sugars 150s-180s.  Lab Results  Component Value Date   HGBA1C 8.2 (H) 09/13/2017   She is also requesting evaluation of cough, cold symptoms x 3 weeks. Symptoms began with fevers, chills, body aches, then began wheezing, productive cough, symptoms seemed to get a little better last week then worse again this week, had to miss work due to symptoms Denies syncope, nasal congestion, chest pain, shortness of breath, abdominal pain, nausea, vomiting, diarrhea. Not a smoker Tried at home: robitussin, dayquil, nyquil  ROS-See HPI  Objective:  Vitals:   01/12/18 1022  BP: 120/80  Pulse: 96   Temp: 98 F (36.7 C)  TempSrc: Oral  SpO2: 97%  Weight: 274 lb (124.3 kg)  Height: 5\' 7"  (1.702 m)    General: Well developed, well nourished, in no acute distress  Skin : Warm and dry.  Head: Normocephalic and atraumatic  Eyes: Sclera and conjunctiva clear; pupils round and reactive to light; extraocular movements intact  Ears: External normal; canals clear; tympanic membranes normal  Oropharynx: Pink, supple. No suspicious lesions  Neck: Supple without thyromegaly, adenopathy  Lungs: Respirations unlabored; clear to auscultation bilaterally without wheeze, rales, rhonchi  CVS exam: normal rate, regular rhythm, normal S1, S2, no murmurs, rubs, clicks or gallops.  Extremities: No edema, cyanosis Vessels: Symmetric bilaterally  Neurologic: Alert and oriented; speech intact; face symmetrical; moves all extremities well; CNII-XII intact without focal deficit  Psychiatric: Normal mood and affect.  Assessment:  1. Type 2 diabetes mellitus without complication, without long-term current use of insulin (HCC)   2. Upper respiratory tract infection, unspecified type   3. Screening for cholesterol level     Plan:   Return in about 6 months (around 07/13/2018) for routine follow up-dm.  Orders Placed This Encounter  Procedures  . Hemoglobin A1c    Standing Status:   Future    Standing Expiration Date:   01/13/2019  . Lipid panel    Standing Status:   Future    Standing Expiration Date:   01/13/2019    Requested Prescriptions   Signed Prescriptions Disp Refills  . azithromycin (ZITHROMAX) 250 MG tablet 6 tablet 0    Sig: Take 2 tablets today, then 1 tablet daily until complete    Upper respiratory tract infection, unspecified type Will start azithromycin course-dosing, side effects discussed Home management, red flags and return precautions including when to seek immediate care discussed and printed on AVS - azithromycin (ZITHROMAX) 250 MG tablet; Take 2 tablets today, then 1  tablet daily until complete  Dispense: 6 tablet; Refill: 0  Screening for cholesterol level Had a Steak biscuit just before her appointment today, will have her return for labs when fasting - Lipid panel; Future

## 2018-01-21 ENCOUNTER — Other Ambulatory Visit (INDEPENDENT_AMBULATORY_CARE_PROVIDER_SITE_OTHER): Payer: Self-pay

## 2018-01-21 ENCOUNTER — Telehealth: Payer: Self-pay | Admitting: Nurse Practitioner

## 2018-01-21 DIAGNOSIS — E119 Type 2 diabetes mellitus without complications: Secondary | ICD-10-CM

## 2018-01-21 DIAGNOSIS — Z13220 Encounter for screening for lipoid disorders: Secondary | ICD-10-CM

## 2018-01-21 LAB — LIPID PANEL
CHOL/HDL RATIO: 3
CHOLESTEROL: 171 mg/dL (ref 0–200)
HDL: 57.3 mg/dL (ref >39.00)
LDL CALC: 98 mg/dL (ref 0–99)
NONHDL: 113.86
Triglycerides: 79 mg/dL (ref 0.0–149.0)
VLDL: 15.8 mg/dL (ref 0.0–40.0)

## 2018-01-21 LAB — HEMOGLOBIN A1C: HEMOGLOBIN A1C: 7.1 % — AB (ref 4.6–6.5)

## 2018-01-21 MED ORDER — AMOXICILLIN-POT CLAVULANATE 875-125 MG PO TABS: 1.0000 | ORAL_TABLET | Freq: Two times a day (BID) | ORAL | 0 refills | Status: DC

## 2018-01-21 NOTE — Telephone Encounter (Signed)
Called pt to inform of below. No answer/unable to leave vm. CRM created.

## 2018-01-21 NOTE — Telephone Encounter (Signed)
Patient states she is no better after finishing her Zpack. She would like to know if something else can be called in.  LOV: 11.27.19

## 2018-01-21 NOTE — Telephone Encounter (Signed)
I have sent a different antibiotic for her to complete- augmentin twice daily for 7 days. Please have her schedule an office visit for follow up if she gets worse or if she is not better after this second antibiotic

## 2018-01-23 ENCOUNTER — Other Ambulatory Visit: Payer: Self-pay | Admitting: Nurse Practitioner

## 2018-01-23 DIAGNOSIS — E119 Type 2 diabetes mellitus without complications: Secondary | ICD-10-CM

## 2018-01-28 ENCOUNTER — Encounter: Payer: Self-pay | Admitting: *Deleted

## 2018-07-28 ENCOUNTER — Other Ambulatory Visit: Payer: Self-pay | Admitting: *Deleted

## 2018-07-28 DIAGNOSIS — E119 Type 2 diabetes mellitus without complications: Secondary | ICD-10-CM

## 2018-07-28 MED ORDER — METFORMIN HCL ER 500 MG PO TB24
1000.0000 mg | ORAL_TABLET | Freq: Two times a day (BID) | ORAL | 0 refills | Status: DC
Start: 2018-07-28 — End: 2018-10-19

## 2018-08-04 ENCOUNTER — Ambulatory Visit: Payer: Self-pay | Admitting: Nurse Practitioner

## 2018-10-19 ENCOUNTER — Telehealth: Payer: Self-pay | Admitting: Family

## 2018-10-19 MED ORDER — METFORMIN HCL 500 MG PO TABS: 1000.0000 mg | ORAL_TABLET | Freq: Two times a day (BID) | ORAL | 0 refills | Status: AC

## 2018-10-19 NOTE — Telephone Encounter (Signed)
We are switching patients to the IR formulation- she would continue to take 2 tablets in the am and 2 tablets in the pm.  She was due for OV in June and needs to schedule follow-up in office.

## 2018-10-19 NOTE — Addendum Note (Signed)
Addended by: Sherlene Shams on: 10/19/2018 11:26 AM   Modules accepted: Orders

## 2018-10-19 NOTE — Telephone Encounter (Signed)
CRM stating : Pt called to schedule appt. Stated she was not aware that Carrie Neal had left [facility]. She would like to know if she can transfer to [place].  [name] please advise, Thank you.

## 2018-10-19 NOTE — Telephone Encounter (Signed)
Pt stated her metFORMIN (GLUCOPHAGE-XR) 500 MG 24 hr tablet has been recalled and needs an alternative sent in. Please advise.  [id number] - [place], [place] [id number] (Phone) [phone] (Fax)

## 2018-10-19 NOTE — Telephone Encounter (Signed)
Tried reaching patient today to give her updated info regarding refill being sent in and to also get her set up for her transfer of care appointment with [name]. Will try and reach her later.

## 2018-10-19 NOTE — Telephone Encounter (Signed)
Routing to Provider to review/ make recommendations.

## 2018-10-22 ENCOUNTER — Other Ambulatory Visit: Payer: Self-pay | Admitting: Internal Medicine

## 2018-10-22 DIAGNOSIS — E119 Type 2 diabetes mellitus without complications: Secondary | ICD-10-CM

## 2018-11-10 ENCOUNTER — Other Ambulatory Visit: Payer: Self-pay | Admitting: Family

## 2018-11-29 NOTE — Telephone Encounter (Signed)
Letter mailed out to patient today.

## 2018-11-29 NOTE — Telephone Encounter (Signed)
Called patient again and left message. Will mail out letter today.

## 2019-04-06 ENCOUNTER — Ambulatory Visit: Payer: Self-pay

## 2019-04-10 ENCOUNTER — Ambulatory Visit: Payer: Self-pay | Attending: Family

## 2019-04-10 DIAGNOSIS — Z23 Encounter for immunization: Secondary | ICD-10-CM | POA: Insufficient documentation

## 2019-04-10 NOTE — Progress Notes (Signed)
   Covid-19 Vaccination Clinic  Name:  Carrie Neal    MRN: [id number] DOB: 1970-09-17  04/10/2019  Carrie Neal was observed post Covid-19 immunization for 15 minutes without incidence. She was provided with Vaccine Information Sheet and instruction to access the V-Safe system.   Carrie Neal was instructed to call 911 with any severe reactions post vaccine: [name] Kitchen Difficulty breathing  . Swelling of your face and throat  . A fast heartbeat  . A bad rash all over your body  . Dizziness and weakness    Immunizations Administered    Name Date Dose VIS Date Route   Moderna COVID-19 Vaccine 04/10/2019  4:00 PM 0.5 mL 01/17/2019 Intramuscular   Manufacturer: Moderna   Lot: [id number]   [id number]

## 2019-05-09 ENCOUNTER — Ambulatory Visit: Payer: Self-pay | Attending: Family

## 2019-05-09 DIAGNOSIS — Z23 Encounter for immunization: Secondary | ICD-10-CM

## 2019-05-09 NOTE — Progress Notes (Signed)
   Covid-19 Vaccination Clinic  Name:  Treyanna Toren    MRN: [id number] DOB: Sep 17, 1970  05/09/2019  Ms. Stellwagen was observed post Covid-19 immunization for 15 minutes without incident. She was provided with Vaccine Information Sheet and instruction to access the V-Safe system.   Ms. Fiero was instructed to call 911 with any severe reactions post vaccine: [name] Kitchen Difficulty breathing  . Swelling of face and throat  . A fast heartbeat  . A bad rash all over body  . Dizziness and weakness   Immunizations Administered    Name Date Dose VIS Date Route   Moderna COVID-19 Vaccine 05/09/2019  4:00 PM 0.5 mL 01/17/2019 Intramuscular   Manufacturer: Moderna   LotMV:[id number]   [id number]

## 2019-05-23 ENCOUNTER — Ambulatory Visit: Payer: Self-pay

## 2019-12-18 ENCOUNTER — Other Ambulatory Visit: Payer: Self-pay

## 2019-12-18 ENCOUNTER — Ambulatory Visit (HOSPITAL_COMMUNITY)
Admission: EM | Admit: 2019-12-18 | Discharge: 2019-12-18 | Disposition: A | Discharge status: Home / Self Care | Payer: BC Managed Care – PPO

## 2019-12-18 ENCOUNTER — Ambulatory Visit (INDEPENDENT_AMBULATORY_CARE_PROVIDER_SITE_OTHER): Payer: BC Managed Care – PPO

## 2019-12-18 ENCOUNTER — Encounter (HOSPITAL_COMMUNITY): Payer: Self-pay | Admitting: Emergency Medicine

## 2019-12-18 DIAGNOSIS — M25562 Pain in left knee: Secondary | ICD-10-CM

## 2019-12-18 DIAGNOSIS — S93402A Sprain of unspecified ligament of left ankle, initial encounter: Secondary | ICD-10-CM

## 2019-12-18 DIAGNOSIS — M25572 Pain in left ankle and joints of left foot: Secondary | ICD-10-CM | POA: Diagnosis not present

## 2019-12-18 DIAGNOSIS — M1712 Unilateral primary osteoarthritis, left knee: Secondary | ICD-10-CM | POA: Diagnosis not present

## 2019-12-18 DIAGNOSIS — M7732 Calcaneal spur, left foot: Secondary | ICD-10-CM | POA: Diagnosis not present

## 2019-12-18 MED ORDER — METHOCARBAMOL 500 MG PO TABS: 500.0000 mg | ORAL_TABLET | Freq: Two times a day (BID) | ORAL | 0 refills | Status: DC

## 2019-12-18 NOTE — ED Provider Notes (Signed)
[place]    CSN: [id number] Arrival date & time: 12/18/19  1723      History   Chief Complaint Chief Complaint  Patient presents with  . Knee Pain  . Ankle Injury    HPI Jia Dottavio is a 49 y.o. female.   Patient presenting today with left lateral ankle and knee pain after twisting her ankle about 2 weeks ago wearing some shoes that were not very supportive. Since incident has had swelling, pain, and difficulty ambulating. Has been taking ibuprofen with very mild relief and using topical rubs. Denies discoloration, numbness, tingling.      Past Medical History:  Diagnosis Date  . Anemia   . Asthma    no inhaler  . Chicken pox   . Diastolic dysfunction    a. 03/2014 Echo: EF 65-70%, gr1 DD, no rwma.  . Dysfunctional uterine bleeding    a. 11/2015 s/p partial hysterectomy.  . Hypertension   . Kidney stones   . Morbid obesity ([place])   . Type 2 diabetes mellitus ([place])    a. Dx 2016.    Patient Active Problem List   Diagnosis Date Noted  . Status post vaginal hysterectomy 11/18/2015  . Seasonal allergies 05/21/2014  . Type 2 diabetes mellitus ([place]) 04/27/2014  . DKA (diabetic ketoacidoses) 04/13/2014  . Hypercalcemia 04/13/2014  . DUB (dysfunctional uterine bleeding) 03/22/2014  . Anemia 03/22/2014    Past Surgical History:  Procedure Laterality Date  . ABDOMINAL HYSTERECTOMY    . CHOLECYSTECTOMY    . CYSTOSCOPY N/A 11/18/2015   Procedure: CYSTOSCOPY;  Surgeon: Waymon Amato, MD;  Location: [facility];  Service: Gynecology;  Laterality: N/A;  . DILATATION & CURETTAGE/HYSTEROSCOPY WITH MYOSURE N/A 03/30/2014   Procedure: DILATATION & CURETTAGE/HYSTEROSCOPY WITH MYOSURE and placement of Mirena Intrauterine device;  Surgeon: Alinda Dooms, MD;  Location: [facility];  Service: Gynecology;  Laterality: N/A;  . HERNIA REPAIR    . TUBAL LIGATION    . UMBILICAL HERNIA REPAIR     as child  . VAGINAL HYSTERECTOMY Bilateral 11/18/2015   Procedure: HYSTERECTOMY  VAGINAL Rightl Salpingectomy;  Surgeon: Waymon Amato, MD;  Location: [facility];  Service: Gynecology;  Laterality: Bilateral;    OB History    Gravida  6   Para  6   Term  6   Preterm      AB  0   Living  6     SAB  0   TAB      Ectopic      Multiple      Live Births               Home Medications    Prior to Admission medications   Medication Sig Start Date End Date Taking? Authorizing Provider  Empagliflozin-metFORMIN HCl ER (SYNJARDY XR) 06-998 MG TB24 Take by mouth daily.   Yes [provider]  gabapentin (NEURONTIN) 300 MG capsule Take 1 capsule by mouth for 1 day then increase to 1 capsule by mouth twice daily x 1 day then increase to 1 capsule by mouth 3x times daily Patient taking differently: Take 300 mg by mouth 2 (two) times daily as needed (leg pain).  08/15/15   Golden Circle, FNP  metFORMIN (GLUCOPHAGE) 500 MG tablet Take 2 tablets (1,000 mg total) by mouth 2 (two) times daily with a meal. 10/19/18   Marrian Salvage, FNP  methocarbamol (ROBAXIN) 500 MG tablet Take 1 tablet (500 mg total) by mouth 2 (two)  times daily. DO NOT DRINK ALCOHOL OR DRIVE WHILE TAKING THIS MEDICATION 12/18/19   Volney American, PA-C  naproxen (NAPROSYN) 500 MG tablet Take 1 tablet (500 mg total) by mouth 2 (two) times daily. 10/11/17   Nuala Alpha A, PA-C  vitamin B-12 (CYANOCOBALAMIN) 1000 MCG tablet Take 500 mcg by mouth daily.    [provider]    Family History Family History  Problem Relation Age of Onset  . Diabetes Mother   . Diabetes Cousin   . Diabetes Maternal Grandmother     Social History Social History   Tobacco Use  . Smoking status: Former Smoker    Years: 4.00    Types: Cigarettes  . Smokeless tobacco: Never Used  Substance Use Topics  . Alcohol use: No  . Drug use: No     Allergies   Codeine   Review of Systems Review of Systems PER HPI   Physical Exam Triage Vital Signs ED Triage Vitals  Enc Vitals  Group     BP 12/18/19 1843 130/89     Pulse Rate 12/18/19 1843 (!) 101     Resp 12/18/19 1843 (!) 22     Temp 12/18/19 1843 98.6 F (37 C)     Temp Source 12/18/19 1843 Oral     SpO2 12/18/19 1843 98 %     Weight --      Height --      Head Circumference --      Peak Flow --      Pain Score 12/18/19 1839 10     Pain Loc --      Pain Edu? --      Excl. in [place]? --    No data found.  Updated Vital Signs BP 130/89 (BP Location: Left Arm)   Pulse (!) 101   Temp 98.6 F (37 C) (Oral)   Resp (!) 22   LMP  (LMP Unknown) Comment: AUB  SpO2 98%   Visual Acuity Right Eye Distance:   Left Eye Distance:   Bilateral Distance:    Right Eye Near:   Left Eye Near:    Bilateral Near:     Physical Exam Vitals and nursing note reviewed.  Constitutional:      Appearance: Normal appearance. She is not ill-appearing.  HENT:     Head: Atraumatic.  Eyes:     Extraocular Movements: Extraocular movements intact.     Conjunctiva/sclera: Conjunctivae normal.  Cardiovascular:     Rate and Rhythm: Normal rate and regular rhythm.     Heart sounds: Normal heart sounds.  Pulmonary:     Effort: Pulmonary effort is normal.     Breath sounds: Normal breath sounds.  Musculoskeletal:        General: Swelling (lateral left ankle and knee edematous) and tenderness (significantly ttp in areas of edema) present. No deformity. Normal range of motion.     Cervical back: Normal range of motion and neck supple.     Comments: ROM exam limited by patient's pain, she is not tolerating extension of left leg at knee or inversion/eversion of left ankle   Skin:    General: Skin is warm and dry.     Findings: No bruising or erythema.  Neurological:     Mental Status: She is alert and oriented to person, place, and time.  Psychiatric:        Mood and Affect: Mood normal.        Thought Content: Thought content normal.  Judgment: Judgment normal.    UC Treatments / Results  Labs (all labs ordered  are listed, but only abnormal results are displayed) Labs Reviewed - No data to display  EKG   Radiology DG Ankle Complete Left  Result Date: 12/18/2019 CLINICAL DATA:  Twisted ankle 2 weeks ago. EXAM: LEFT ANKLE COMPLETE - 3+ VIEW COMPARISON:  None. FINDINGS: No acute bony abnormality. Specifically, no fracture, subluxation, or dislocation. Soft tissues are intact. Joint spaces maintained. Posterior and plantar calcaneal spurs. IMPRESSION: No acute bony abnormality. Electronically Signed   By: Rolm Baptise M.D.   On: 12/18/2019 19:59   DG Knee Complete 4 Views Left  Result Date: 12/18/2019 CLINICAL DATA:  Left lateral knee pain. EXAM: LEFT KNEE - COMPLETE 4+ VIEW COMPARISON:  None. FINDINGS: Degenerative changes in the patellofemoral compartment with joint space narrowing and spurring. No acute bony abnormality. Specifically, no fracture, subluxation, or dislocation. No joint effusion. IMPRESSION: No acute bony abnormality. Electronically Signed   By: Rolm Baptise M.D.   On: 12/18/2019 19:59    Procedures Procedures (including critical care time)  Medications Ordered in UC Medications - No data to display  Initial Impression / Assessment and Plan / UC Course  I have reviewed the triage vital signs and the nursing notes.  Pertinent labs & imaging results that were available during my care of the patient were reviewed by me and considered in my medical decision making (see chart for details).     X-rays of left ankle and knee both negative for bony injury, tx with robaxin, NSAIDs, RICE. Work note given. F/u with PCP if not resolving.   Final Clinical Impressions(s) / UC Diagnoses   Final diagnoses:  Acute pain of left knee  Sprain of left ankle, unspecified ligament, initial encounter   Discharge Instructions   None    ED Prescriptions    Medication Sig Dispense Auth. Provider   methocarbamol (ROBAXIN) 500 MG tablet Take 1 tablet (500 mg total) by mouth 2 (two) times daily.  DO NOT DRINK ALCOHOL OR DRIVE WHILE TAKING THIS MEDICATION 30 tablet Volney American, [place]     PDMP not reviewed this encounter.   Merrie Roof [place], [place] 12/19/19 [phone]

## 2019-12-18 NOTE — ED Triage Notes (Signed)
Pt c/o left knee pain and left ankle inversion with pain about 2 weeks. Pt states she was walking in some shoes that were not sturdy and twisted her leg. Pt states she has been taking ibuprofen.

## 2019-12-26 DIAGNOSIS — U071 COVID-19: Secondary | ICD-10-CM | POA: Diagnosis not present

## 2020-01-17 DIAGNOSIS — M13162 Monoarthritis, not elsewhere classified, left knee: Secondary | ICD-10-CM | POA: Diagnosis not present

## 2020-01-17 DIAGNOSIS — E1169 Type 2 diabetes mellitus with other specified complication: Secondary | ICD-10-CM | POA: Diagnosis not present

## 2020-01-17 DIAGNOSIS — I10 Essential (primary) hypertension: Secondary | ICD-10-CM | POA: Diagnosis not present

## 2020-01-24 ENCOUNTER — Ambulatory Visit
Admission: RE | Admit: 2020-01-24 | Discharge: 2020-01-24 | Disposition: A | Discharge status: Home / Self Care | Payer: BC Managed Care – PPO | Source: Ambulatory Visit | Attending: Family Medicine | Admitting: Family Medicine

## 2020-01-24 ENCOUNTER — Other Ambulatory Visit: Payer: Self-pay

## 2020-01-24 ENCOUNTER — Other Ambulatory Visit: Payer: Self-pay | Admitting: Family Medicine

## 2020-01-24 DIAGNOSIS — M1712 Unilateral primary osteoarthritis, left knee: Secondary | ICD-10-CM | POA: Diagnosis not present

## 2020-01-24 DIAGNOSIS — M25562 Pain in left knee: Secondary | ICD-10-CM

## 2020-01-25 DIAGNOSIS — I10 Essential (primary) hypertension: Secondary | ICD-10-CM | POA: Diagnosis not present

## 2020-01-25 DIAGNOSIS — M130 Polyarthritis, unspecified: Secondary | ICD-10-CM | POA: Diagnosis not present

## 2020-01-25 DIAGNOSIS — E1169 Type 2 diabetes mellitus with other specified complication: Secondary | ICD-10-CM | POA: Diagnosis not present

## 2020-02-08 DIAGNOSIS — Z1231 Encounter for screening mammogram for malignant neoplasm of breast: Secondary | ICD-10-CM | POA: Diagnosis not present

## 2020-03-07 DIAGNOSIS — E1169 Type 2 diabetes mellitus with other specified complication: Secondary | ICD-10-CM | POA: Diagnosis not present

## 2020-03-07 DIAGNOSIS — I10 Essential (primary) hypertension: Secondary | ICD-10-CM | POA: Diagnosis not present

## 2020-06-13 DIAGNOSIS — I10 Essential (primary) hypertension: Secondary | ICD-10-CM | POA: Diagnosis not present

## 2020-06-13 DIAGNOSIS — E1169 Type 2 diabetes mellitus with other specified complication: Secondary | ICD-10-CM | POA: Diagnosis not present

## 2020-06-13 DIAGNOSIS — M130 Polyarthritis, unspecified: Secondary | ICD-10-CM | POA: Diagnosis not present

## 2020-09-12 DIAGNOSIS — E1169 Type 2 diabetes mellitus with other specified complication: Secondary | ICD-10-CM | POA: Diagnosis not present

## 2020-09-12 DIAGNOSIS — M130 Polyarthritis, unspecified: Secondary | ICD-10-CM | POA: Diagnosis not present

## 2020-09-12 DIAGNOSIS — I10 Essential (primary) hypertension: Secondary | ICD-10-CM | POA: Diagnosis not present

## 2020-10-16 DIAGNOSIS — I10 Essential (primary) hypertension: Secondary | ICD-10-CM | POA: Diagnosis not present

## 2020-10-16 DIAGNOSIS — E1169 Type 2 diabetes mellitus with other specified complication: Secondary | ICD-10-CM | POA: Diagnosis not present

## 2021-01-25 ENCOUNTER — Ambulatory Visit (HOSPITAL_COMMUNITY)
Admission: EM | Admit: 2021-01-25 | Discharge: 2021-01-25 | Disposition: A | Discharge status: Home / Self Care | Payer: BC Managed Care – PPO | Attending: Emergency Medicine | Admitting: Emergency Medicine

## 2021-01-25 ENCOUNTER — Encounter (HOSPITAL_COMMUNITY): Payer: Self-pay

## 2021-01-25 ENCOUNTER — Other Ambulatory Visit: Payer: Self-pay

## 2021-01-25 DIAGNOSIS — M79674 Pain in right toe(s): Secondary | ICD-10-CM | POA: Diagnosis not present

## 2021-01-25 MED ORDER — COLCHICINE 0.6 MG PO TABS: ORAL_TABLET | ORAL | 0 refills | Status: DC

## 2021-01-25 MED ORDER — KETOROLAC TROMETHAMINE 30 MG/ML IJ SOLN
INTRAMUSCULAR | Status: AC
  Filled 2021-01-25: qty 1

## 2021-01-25 MED ORDER — KETOROLAC TROMETHAMINE 30 MG/ML IJ SOLN
30.0000 mg | Freq: Once | INTRAMUSCULAR | Status: AC
  Administered 2021-01-25: 30 mg via INTRAMUSCULAR

## 2021-01-25 NOTE — ED Provider Notes (Signed)
[facility]  ____________________________________________  Time seen: Approximately 7:01 PM  I have reviewed the triage vital signs and the nursing notes.   HISTORY  Chief Complaint No chief complaint on file.   Historian Patient     HPI Carrie Neal is a 50 y.o. female presents to the urgent care with right great toe pain.  Patient states that she has a diet that is high and pork and red meat.  Denies daily smoking or alcohol consumption.  States that her parents and grandparents all had gout and she is apprehensive that she is newly symptomatic for gout.  She denies fever and chills.  No traumas to the right great toe.   Past Medical History:  Diagnosis Date   Anemia    Asthma    no inhaler   Chicken pox    Diastolic dysfunction    a. 03/2014 Echo: EF 65-70%, gr1 DD, no rwma.   Dysfunctional uterine bleeding    a. 11/2015 s/p partial hysterectomy.   Hypertension    Kidney stones    Morbid obesity ([place])    Type 2 diabetes mellitus ([place])    a. Dx 2016.     Immunizations up to date:  Yes.     Past Medical History:  Diagnosis Date   Anemia    Asthma    no inhaler   Chicken pox    Diastolic dysfunction    a. 03/2014 Echo: EF 65-70%, gr1 DD, no rwma.   Dysfunctional uterine bleeding    a. 11/2015 s/p partial hysterectomy.   Hypertension    Kidney stones    Morbid obesity ([place])    Type 2 diabetes mellitus ([place])    a. Dx 2016.    Patient Active Problem List   Diagnosis Date Noted   Status post vaginal hysterectomy 11/18/2015   Seasonal allergies 05/21/2014   Type 2 diabetes mellitus ([place]) 04/27/2014   DKA (diabetic ketoacidoses) 04/13/2014   Hypercalcemia 04/13/2014   DUB (dysfunctional uterine bleeding) 03/22/2014   Anemia 03/22/2014    Past Surgical History:  Procedure Laterality Date   ABDOMINAL HYSTERECTOMY     CHOLECYSTECTOMY     CYSTOSCOPY N/A 11/18/2015   Procedure: CYSTOSCOPY;  Surgeon: Waymon Amato, MD;  Location: [facility];  Service:  Gynecology;  Laterality: N/A;   DILATATION & CURETTAGE/HYSTEROSCOPY WITH MYOSURE N/A 03/30/2014   Procedure: DILATATION & CURETTAGE/HYSTEROSCOPY WITH MYOSURE and placement of Mirena Intrauterine device;  Surgeon: Alinda Dooms, MD;  Location: [facility];  Service: Gynecology;  Laterality: N/A;   HERNIA REPAIR     TUBAL LIGATION     UMBILICAL HERNIA REPAIR     as child   VAGINAL HYSTERECTOMY Bilateral 11/18/2015   Procedure: HYSTERECTOMY VAGINAL Rightl Salpingectomy;  Surgeon: Waymon Amato, MD;  Location: [facility];  Service: Gynecology;  Laterality: Bilateral;    Prior to Admission medications   Medication Sig Start Date End Date Taking? Authorizing Provider  colchicine 0.6 MG tablet Take 2 tablets the first day.  Wait 1 hour and then take 1 more tablet.  Take 1 tablet on days 2 through 5. 01/25/21  Yes Vallarie Mare M, PA-C  Empagliflozin-metFORMIN HCl ER (SYNJARDY XR) 06-998 MG TB24 Take by mouth daily.    [provider]  gabapentin (NEURONTIN) 300 MG capsule Take 1 capsule by mouth for 1 day then increase to 1 capsule by mouth twice daily x 1 day then increase to 1 capsule by mouth 3x times daily Patient taking differently: Take 300 mg by  mouth 2 (two) times daily as needed (leg pain).  08/15/15   Golden Circle, FNP  metFORMIN (GLUCOPHAGE) 500 MG tablet Take 2 tablets (1,000 mg total) by mouth 2 (two) times daily with a meal. 10/19/18   Marrian Salvage, FNP  methocarbamol (ROBAXIN) 500 MG tablet Take 1 tablet (500 mg total) by mouth 2 (two) times daily. DO NOT DRINK ALCOHOL OR DRIVE WHILE TAKING THIS MEDICATION 12/18/19   Volney American, PA-C  naproxen (NAPROSYN) 500 MG tablet Take 1 tablet (500 mg total) by mouth 2 (two) times daily. 10/11/17   Nuala Alpha A, PA-C  vitamin B-12 (CYANOCOBALAMIN) 1000 MCG tablet Take 500 mcg by mouth daily.    [provider]    Allergies Codeine  Family History  Problem Relation Age of Onset   Diabetes Mother    Diabetes  Cousin    Diabetes Maternal Grandmother     Social History Social History   Tobacco Use   Smoking status: Former    Years: 4.00    Types: Cigarettes   Smokeless tobacco: Never  Substance Use Topics   Alcohol use: No   Drug use: No     Review of Systems  Constitutional: No fever/chills Eyes:  No discharge ENT: No upper respiratory complaints. Respiratory: no cough. No SOB/ use of accessory muscles to breath Gastrointestinal:   No nausea, no vomiting.  No diarrhea.  No constipation. Musculoskeletal: Patient has right great toe pain. Skin: Negative for rash, abrasions, lacerations, ecchymosis.    ____________________________________________   PHYSICAL EXAM:  VITAL SIGNS: ED Triage Vitals  Enc Vitals Group     BP 01/25/21 1751 (!) 150/132     Pulse --      Resp 01/25/21 1750 16     Temp 01/25/21 1750 98.3 F (36.8 C)     Temp Source 01/25/21 1750 Oral     SpO2 01/25/21 1750 100 %     Weight --      Height --      Head Circumference --      Peak Flow --      Pain Score 01/25/21 1751 6     Pain Loc --      Pain Edu? --      Excl. in [place]? --      Constitutional: Alert and oriented. Well appearing and in no acute distress. Eyes: Conjunctivae are normal. PERRL. EOMI. Head: Atraumatic. ENT: Cardiovascular: Normal rate, regular rhythm. Normal S1 and S2.  Good peripheral circulation. Respiratory: Normal respiratory effort without tachypnea or retractions. Lungs CTAB. Good air entry to the bases with no decreased or absent breath sounds Gastrointestinal: Bowel sounds x 4 quadrants. Soft and nontender to palpation. No guarding or rigidity. No distention. Musculoskeletal: Full range of motion to all extremities. No obvious deformities noted Neurologic:  Normal for age. No gross focal neurologic deficits are appreciated.  Skin: Patient has erythema of the right great toe. Psychiatric: Mood and affect are normal for age. Speech and behavior are normal.    ____________________________________________   LABS (all labs ordered are listed, but only abnormal results are displayed)  Labs Reviewed - No data to display ____________________________________________  EKG   ____________________________________________  RADIOLOGY   No results found.  ____________________________________________    PROCEDURES  Procedure(s) performed:     Procedures     Medications  ketorolac (TORADOL) 30 MG/ML injection 30 mg (30 mg Intramuscular Given 01/25/21 1833)     ____________________________________________   INITIAL IMPRESSION / ASSESSMENT  AND PLAN / ED COURSE  Pertinent labs & imaging results that were available during my care of the patient were reviewed by me and considered in my medical decision making (see chart for details).      Assessment and plan Gout 50 year old female presents to the urgent care with erythema on the right great toe.  History and physical exam findings suggest gout.  We will treat with injection of Toradol in the urgent care and colchicine at discharge.  Return precautions were given to return with new or worsening symptoms.  All patient questions were answered.     ____________________________________________  FINAL CLINICAL IMPRESSION(S) / ED DIAGNOSES  Final diagnoses:  Pain of toe of right foot      NEW MEDICATIONS STARTED DURING THIS VISIT:  ED Discharge Orders          Ordered    colchicine 0.6 MG tablet        01/25/21 1828                This chart was dictated using voice recognition software/Dragon. Despite best efforts to proofread, errors can occur which can change the meaning. Any change was purely unintentional.     Lannie Fields, PA-C 01/25/21 1903

## 2021-01-25 NOTE — ED Triage Notes (Signed)
Pt complains of right toe pain for several days.

## 2021-01-28 ENCOUNTER — Other Ambulatory Visit: Payer: Self-pay

## 2021-01-28 ENCOUNTER — Encounter (HOSPITAL_COMMUNITY): Payer: Self-pay

## 2021-01-28 ENCOUNTER — Ambulatory Visit (HOSPITAL_COMMUNITY)
Admission: EM | Admit: 2021-01-28 | Discharge: 2021-01-28 | Disposition: A | Discharge status: Home / Self Care | Payer: BC Managed Care – PPO | Attending: Family Medicine | Admitting: Family Medicine

## 2021-01-28 ENCOUNTER — Ambulatory Visit (INDEPENDENT_AMBULATORY_CARE_PROVIDER_SITE_OTHER): Payer: BC Managed Care – PPO

## 2021-01-28 ENCOUNTER — Ambulatory Visit (HOSPITAL_COMMUNITY): Payer: BC Managed Care – PPO

## 2021-01-28 DIAGNOSIS — W19XXXA Unspecified fall, initial encounter: Secondary | ICD-10-CM

## 2021-01-28 DIAGNOSIS — M25512 Pain in left shoulder: Secondary | ICD-10-CM

## 2021-01-28 DIAGNOSIS — M19012 Primary osteoarthritis, left shoulder: Secondary | ICD-10-CM | POA: Diagnosis not present

## 2021-01-28 MED ORDER — TRAMADOL HCL 50 MG PO TABS
50.0000 mg | ORAL_TABLET | Freq: Four times a day (QID) | ORAL | 0 refills | Status: DC | PRN
Start: 2021-01-28 — End: 2021-05-28

## 2021-01-28 MED ORDER — DICLOFENAC SODIUM 75 MG PO TBEC: 75.0000 mg | DELAYED_RELEASE_TABLET | Freq: Two times a day (BID) | ORAL | 0 refills | Status: DC

## 2021-01-28 NOTE — ED Triage Notes (Signed)
Pt presents with c/o L shoulder pain. Pt states she slipped at work and fall on her L shoulder.

## 2021-01-28 NOTE — ED Notes (Signed)
Patient reports falling today on a marble floor.  Patient fell on left side.  Left shoulder hurts.  Limited range of motion.  .  Left radial pulse is 2 plus, able to move fingers on left hand .  Denies pain in left arm to palpation.

## 2021-01-28 NOTE — Discharge Instructions (Addendum)
Be aware, you have been prescribed pain medications that may cause drowsiness. Do not combine with alcohol or other illicit drugs. Please do not drive, operate heavy machinery, or take part in activities that require making important decisions while on this medication as your judgement may be clouded.  

## 2021-01-29 NOTE — ED Provider Notes (Signed)
[name]   [id number] 01/28/21 Arrival Time: 7741  ASSESSMENT & PLAN:  1. Acute pain of left shoulder    I have personally viewed the imaging studies ordered this visit. No shoulder fracture/dislocation noted.  Meds ordered this encounter  Medications   diclofenac (VOLTAREN) 75 MG EC tablet    Sig: Take 1 tablet (75 mg total) by mouth 2 (two) times daily.    Dispense:  14 tablet    Refill:  0   traMADol (ULTRAM) 50 MG tablet    Sig: Take 1 tablet (50 mg total) by mouth every 6 (six) hours as needed.    Dispense:  15 tablet    Refill:  0    Orders Placed This Encounter  Procedures   DG Shoulder Left   Apply Shoulder Immobilizer/Sling   Discussed importance of ROM. Sling for comfort as needed.  Recommend:  Follow-up Information     [facility].   Why: If worsening or failing to improve as anticipated. Contact information: [address] Carrie Neal [phone]               Reviewed expectations re: course of current medical issues. Questions answered. Outlined signs and symptoms indicating need for more acute intervention. Patient verbalized understanding. After Visit Summary given.  SUBJECTIVE: History from: patient. Carrie Neal is a 50 y.o. female who reports L shoulder pain; reports fall onto L shoulder today; very sore; pain with movements. No extremity sensation changes or weakness. No tx PTA.   Past Surgical History:  Procedure Laterality Date   ABDOMINAL HYSTERECTOMY     CHOLECYSTECTOMY     CYSTOSCOPY N/A 11/18/2015   Procedure: CYSTOSCOPY;  Surgeon: Waymon Amato, MD;  Location: [facility];  Service: Gynecology;  Laterality: N/A;   DILATATION & CURETTAGE/HYSTEROSCOPY WITH MYOSURE N/A 03/30/2014   Procedure: DILATATION & CURETTAGE/HYSTEROSCOPY WITH MYOSURE and placement of Mirena Intrauterine device;  Surgeon: Alinda Dooms, MD;  Location: [facility];  Service: Gynecology;   Laterality: N/A;   HERNIA REPAIR     TUBAL LIGATION     UMBILICAL HERNIA REPAIR     as child   VAGINAL HYSTERECTOMY Bilateral 11/18/2015   Procedure: HYSTERECTOMY VAGINAL Rightl Salpingectomy;  Surgeon: Waymon Amato, MD;  Location: [facility];  Service: Gynecology;  Laterality: Bilateral;      OBJECTIVE:  Vitals:   01/28/21 1922 01/28/21 1923  BP:  121/79  Pulse: 88   Resp: 17   Temp: 97.9 F (36.6 C)   TempSrc: Oral   SpO2: 97%     General appearance: alert; no distress HEENT: [place]; AT Neck: supple with FROM Resp: unlabored respirations Extremities: LUE: warm with well perfused appearance; poorly localized moderate tenderness over left shoulder over deltoid area; without gross deformities; swelling: none; bruising: none; shoulder ROM: limited by reported pain CV: brisk extremity capillary refill of LUE; 2+ radial pulse of LUE. Skin: warm and dry; no visible rashes Neurologic: gait normal; normal sensation and strength of LUE Psychological: alert and cooperative; normal mood and affect  Imaging: DG Shoulder Left  Result Date: 01/28/2021 CLINICAL DATA:  Fall, left shoulder pain EXAM: LEFT SHOULDER - 2+ VIEW COMPARISON:  None. FINDINGS: Degenerative changes in the [facility] joint with joint space narrowing and spurring. Glenohumeral joint is maintained. No acute bony abnormality. Specifically, no fracture, subluxation, or dislocation. Soft tissues are intact. IMPRESSION: Degenerative changes in the left AC joint. Electronically Signed   By: Rolm Baptise M.D.  On: 01/28/2021 19:23      Allergies  Allergen Reactions   Codeine Itching    Past Medical History:  Diagnosis Date   Anemia    Asthma    no inhaler   Chicken pox    Diastolic dysfunction    a. 03/2014 Echo: EF 65-70%, gr1 DD, no rwma.   Dysfunctional uterine bleeding    a. 11/2015 s/p partial hysterectomy.   Hypertension    Kidney stones    Morbid obesity ([place])    Type 2 diabetes mellitus ([place])    a. Dx 2016.   Social  History   Socioeconomic History   Marital status: Married    Spouse name: Not on file   Number of children: 6   Years of education: 9   Highest education level: Not on file  Occupational History   Occupation: CNA  Tobacco Use   Smoking status: Former    Years: 4.00    Types: Cigarettes   Smokeless tobacco: Never  Substance and Sexual Activity   Alcohol use: No   Drug use: No   Sexual activity: Yes    Birth control/protection: Surgical  Other Topics Concern   Not on file  Social History Narrative   Born and raised in [place], [place]. Fun: shop   Denies religious beliefs effecting healthcare.    Works as in Midwife.   Does not routinely exercise.   Social Determinants of Health   Financial Resource Strain: Not on file  Food Insecurity: Not on file  Transportation Needs: Not on file  Physical Activity: Not on file  Stress: Not on file  Social Connections: Not on file   Family History  Problem Relation Age of Onset   Diabetes Mother    Diabetes Cousin    Diabetes Maternal Grandmother    Past Surgical History:  Procedure Laterality Date   ABDOMINAL HYSTERECTOMY     CHOLECYSTECTOMY     CYSTOSCOPY N/A 11/18/2015   Procedure: CYSTOSCOPY;  Surgeon: Waymon Amato, MD;  Location: [facility];  Service: Gynecology;  Laterality: N/A;   DILATATION & CURETTAGE/HYSTEROSCOPY WITH MYOSURE N/A 03/30/2014   Procedure: DILATATION & CURETTAGE/HYSTEROSCOPY WITH MYOSURE and placement of Mirena Intrauterine device;  Surgeon: Alinda Dooms, MD;  Location: [facility];  Service: Gynecology;  Laterality: N/A;   HERNIA REPAIR     TUBAL LIGATION     UMBILICAL HERNIA REPAIR     as child   VAGINAL HYSTERECTOMY Bilateral 11/18/2015   Procedure: HYSTERECTOMY VAGINAL Rightl Salpingectomy;  Surgeon: Waymon Amato, MD;  Location: [facility];  Service: Gynecology;  Laterality: Bilateral;       Vanessa Kick, MD 01/29/21 [phone]

## 2021-02-04 DIAGNOSIS — I10 Essential (primary) hypertension: Secondary | ICD-10-CM | POA: Diagnosis not present

## 2021-02-04 DIAGNOSIS — M1000 Idiopathic gout, unspecified site: Secondary | ICD-10-CM | POA: Diagnosis not present

## 2021-02-04 DIAGNOSIS — M130 Polyarthritis, unspecified: Secondary | ICD-10-CM | POA: Diagnosis not present

## 2021-02-04 DIAGNOSIS — E1169 Type 2 diabetes mellitus with other specified complication: Secondary | ICD-10-CM | POA: Diagnosis not present

## 2021-04-02 ENCOUNTER — Other Ambulatory Visit (HOSPITAL_BASED_OUTPATIENT_CLINIC_OR_DEPARTMENT_OTHER): Payer: Self-pay

## 2021-04-02 MED ORDER — OZEMPIC (2 MG/DOSE) 8 MG/3ML ~~LOC~~ SOPN
PEN_INJECTOR | SUBCUTANEOUS | 1 refills | Status: AC
  Filled 2021-04-02 (×2): qty 9, 84d supply, fill #0
  Filled 2021-04-02: qty 9, 90d supply, fill #0
  Filled 2021-07-03: qty 3, 28d supply, fill #1
  Filled 2021-07-17: qty 9, 84d supply, fill #1

## 2021-04-03 ENCOUNTER — Other Ambulatory Visit (HOSPITAL_BASED_OUTPATIENT_CLINIC_OR_DEPARTMENT_OTHER): Payer: Self-pay

## 2021-05-28 ENCOUNTER — Encounter (HOSPITAL_COMMUNITY): Payer: Self-pay

## 2021-05-28 ENCOUNTER — Ambulatory Visit (HOSPITAL_COMMUNITY)
Admission: EM | Admit: 2021-05-28 | Discharge: 2021-05-28 | Disposition: A | Discharge status: Home / Self Care | Payer: BLUE CROSS/BLUE SHIELD | Attending: Family Medicine | Admitting: Family Medicine

## 2021-05-28 DIAGNOSIS — M1009 Idiopathic gout, multiple sites: Secondary | ICD-10-CM

## 2021-05-28 DIAGNOSIS — M25562 Pain in left knee: Secondary | ICD-10-CM

## 2021-05-28 MED ORDER — KETOROLAC TROMETHAMINE 30 MG/ML IJ SOLN
INTRAMUSCULAR | Status: AC
  Filled 2021-05-28: qty 1

## 2021-05-28 MED ORDER — PREDNISONE 20 MG PO TABS: 40.0000 mg | ORAL_TABLET | Freq: Every day | ORAL | 0 refills | Status: AC

## 2021-05-28 MED ORDER — KETOROLAC TROMETHAMINE 30 MG/ML IJ SOLN
30.0000 mg | Freq: Once | INTRAMUSCULAR | Status: AC
  Administered 2021-05-28: 30 mg via INTRAMUSCULAR

## 2021-05-28 MED ORDER — ALLOPURINOL 300 MG PO TABS: 300.0000 mg | ORAL_TABLET | Freq: Every day | ORAL | 2 refills | Status: AC

## 2021-05-28 NOTE — ED Provider Notes (Signed)
?[place] ? ? ? ?CSN: [id number] ?Arrival date & time: 05/28/21  1220 ? ? ?  ? ?History   ?Chief Complaint ?Chief Complaint  ?Patient presents with  ? Knee Pain  ?  left  ? ? ?HPI ?Carrie Neal is a 51 y.o. female.  ? ? ?Knee Pain ?Here for left knee pain that is been worse in the last 3 weeks.  It does pop some time.  No recent trauma or injury. ? ?She also has pain in her distal bilateral feet, and this has been deemed due to gout in the past.  She states the colchicine has not helped.  She has not on allopurinol. ? ?She has diabetes, and her sugar has been well controlled ?Past Medical History:  ?Diagnosis Date  ? Anemia   ? Asthma   ? no inhaler  ? Chicken pox   ? Diastolic dysfunction   ? a. 03/2014 Echo: EF 65-70%, gr1 DD, no rwma.  ? Dysfunctional uterine bleeding   ? a. 11/2015 s/p partial hysterectomy.  ? Hypertension   ? Kidney stones   ? Morbid obesity ([place])   ? Type 2 diabetes mellitus ([place])   ? a. Dx 2016.  ? ? ?Patient Active Problem List  ? Diagnosis Date Noted  ? Status post vaginal hysterectomy 11/18/2015  ? Seasonal allergies 05/21/2014  ? Type 2 diabetes mellitus ([place]) 04/27/2014  ? DKA (diabetic ketoacidoses) 04/13/2014  ? Hypercalcemia 04/13/2014  ? DUB (dysfunctional uterine bleeding) 03/22/2014  ? Anemia 03/22/2014  ? ? ?Past Surgical History:  ?Procedure Laterality Date  ? ABDOMINAL HYSTERECTOMY    ? CHOLECYSTECTOMY    ? CYSTOSCOPY N/A 11/18/2015  ? Procedure: CYSTOSCOPY;  Surgeon: Waymon Amato, MD;  Location: [facility];  Service: Gynecology;  Laterality: N/A;  ? DILATATION & CURETTAGE/HYSTEROSCOPY WITH MYOSURE N/A 03/30/2014  ? Procedure: DILATATION & CURETTAGE/HYSTEROSCOPY WITH MYOSURE and placement of Mirena Intrauterine device;  Surgeon: Alinda Dooms, MD;  Location: [facility];  Service: Gynecology;  Laterality: N/A;  ? HERNIA REPAIR    ? TUBAL LIGATION    ? UMBILICAL HERNIA REPAIR    ? as child  ? VAGINAL HYSTERECTOMY Bilateral 11/18/2015  ? Procedure: HYSTERECTOMY VAGINAL Rightl  Salpingectomy;  Surgeon: Waymon Amato, MD;  Location: [facility];  Service: Gynecology;  Laterality: Bilateral;  ? ? ?OB History   ? ? Gravida  ?6  ? Para  ?6  ? Term  ?6  ? Preterm  ?   ? AB  ?0  ? Living  ?6  ?  ? ? SAB  ?0  ? IAB  ?   ? Ectopic  ?   ? Multiple  ?   ? Live Births  ?   ?   ?  ?  ? ? ? ?Home Medications   ? ?Prior to Admission medications   ?Medication Sig Start Date End Date Taking? Authorizing Provider  ?allopurinol (ZYLOPRIM) 300 MG tablet Take 1 tablet (300 mg total) by mouth daily. 05/28/21  Yes Barrett Henle, MD  ?predniSONE (DELTASONE) 20 MG tablet Take 2 tablets (40 mg total) by mouth daily with breakfast for 5 days. 05/28/21 06/02/21 Yes Kellianne Ek, Gwenlyn Perking, MD  ?Empagliflozin-metFORMIN HCl ER (SYNJARDY XR) 06-998 MG TB24 Take by mouth daily.    [provider]  ?gabapentin (NEURONTIN) 300 MG capsule Take 1 capsule by mouth for 1 day then increase to 1 capsule by mouth twice daily x 1 day then increase to 1 capsule by mouth 3x  times daily ?Patient taking differently: Take 300 mg by mouth 2 (two) times daily as needed (leg pain).  08/15/15   Golden Circle, FNP  ?GLYXAMBI 25-5 MG TABS Take 1 tablet by mouth every morning. 04/02/21   [provider]  ?metFORMIN (GLUCOPHAGE) 500 MG tablet Take 2 tablets (1,000 mg total) by mouth 2 (two) times daily with a meal. 10/19/18   Marrian Salvage, [place]  ?Semaglutide, 2 MG/DOSE, (OZEMPIC, 2 MG/DOSE,) 8 MG/3ML SOPN Inject 2 mg intramuscularly once a week 04/02/21     ?vitamin B-12 (CYANOCOBALAMIN) 1000 MCG tablet Take 500 mcg by mouth daily.    [provider]  ? ? ?Family History ?Family History  ?Problem Relation Age of Onset  ? Diabetes Mother   ? Diabetes Cousin   ? Diabetes Maternal Grandmother   ? ? ?Social History ?Social History  ? ?Tobacco Use  ? Smoking status: Former  ?  Years: 4.00  ?  Types: Cigarettes  ? Smokeless tobacco: Never  ?Substance Use Topics  ? Alcohol use: No  ? Drug use: No  ? ? ? ?Allergies    ?Codeine ? ? ?Review of Systems ?Review of Systems ? ? ?Physical Exam ?Triage Vital Signs ?ED Triage Vitals [05/28/21 1431]  ?Enc Vitals Group  ?   BP 128/85  ?   Pulse Rate 93  ?   Resp 17  ?   Temp 98.1 ?F (36.7 ?C)  ?   Temp Source Oral  ?   SpO2 96 %  ?   Weight   ?   Height   ?   Head Circumference   ?   Peak Flow   ?   Pain Score 10  ?   Pain Loc   ?   Pain Edu?   ?   Excl. in [place]?   ? ?No data found. ? ?Updated Vital Signs ?BP 128/85 (BP Location: Right Arm)   Pulse 93   Temp 98.1 ?F (36.7 ?C) (Oral)   Resp 17   LMP  (LMP Unknown) Comment: AUB  SpO2 96%  ? ?Visual Acuity ?Right Eye Distance:   ?Left Eye Distance:   ?Bilateral Distance:   ? ?Right Eye Near:   ?Left Eye Near:    ?Bilateral Near:    ? ?Physical Exam ?Vitals reviewed.  ?Constitutional:   ?   General: She is not in acute distress. ?   Appearance: She is not toxic-appearing.  ?Cardiovascular:  ?   Rate and Rhythm: Normal rate and regular rhythm.  ?Pulmonary:  ?   Breath sounds: Normal breath sounds.  ?Musculoskeletal:     ?   General: Tenderness (bilateral feet) present. Normal range of motion.  ?   Right lower leg: No edema.  ?   Left lower leg: No edema.  ?Neurological:  ?   Mental Status: She is alert and oriented to person, place, and time.  ?Psychiatric:     ?   Behavior: Behavior normal.  ? ? ? ?UC Treatments / Results  ?Labs ?(all labs ordered are listed, but only abnormal results are displayed) ?Labs Reviewed - No data to display ? ?EKG ? ? ?Radiology ?No results found. ? ?Procedures ?Procedures (including critical care time) ? ?Medications Ordered in UC ?Medications  ?ketorolac (TORADOL) 30 MG/ML injection 30 mg (has no administration in time range)  ? ? ?Initial Impression / Assessment and Plan / UC Course  ?I have reviewed the triage vital signs and the nursing notes. ? ?Pertinent  labs & imaging results that were available during my care of the patient were reviewed by me and considered in my medical decision making (see chart for  details). ? ?  ? ?We will give her prednisone course for 5 days, and begin allopurinol ?Final Clinical Impressions(s) / UC Diagnoses  ? ?Final diagnoses:  ?Acute pain of left knee  ?Acute idiopathic gout of multiple sites  ? ? ? ?Discharge Instructions   ? ?  ?You have been given a shot of Toradol 30 mg ? ?Take prednisone 20 mg--2 tabs daily for 5 days ? ?Take allopurinol 300 mg-1 tab daily to lower your uric acid and your blood- ? ? ? ?ED Prescriptions   ? ? Medication Sig Dispense Auth. Provider  ? predniSONE (DELTASONE) 20 MG tablet Take 2 tablets (40 mg total) by mouth daily with breakfast for 5 days. 10 tablet Barrett Henle, MD  ? allopurinol (ZYLOPRIM) 300 MG tablet Take 1 tablet (300 mg total) by mouth daily. 30 tablet Barrett Henle, MD  ? ?  ? ?I have reviewed the PDMP during this encounter. ?  ?Barrett Henle, MD ?05/28/21 1454 ? ?

## 2021-05-28 NOTE — ED Triage Notes (Signed)
2 week h/o left knee pain. Pt also c/o gout sxs in her feet.  ?Has been taking tylenol and ibuprofen for arthritis pain. ?Ambulation aggravates sxs. No falls or injuries. ?Pt reports that gout meds do not work for gout sxs. ?

## 2021-05-28 NOTE — Discharge Instructions (Addendum)
You have been given a shot of Toradol 30 mg ? ?Take prednisone 20 mg--2 tabs daily for 5 days ? ?Take allopurinol 300 mg-1 tab daily to lower your uric acid and your blood- ?

## 2021-07-03 ENCOUNTER — Other Ambulatory Visit (HOSPITAL_BASED_OUTPATIENT_CLINIC_OR_DEPARTMENT_OTHER): Payer: Self-pay

## 2021-07-07 ENCOUNTER — Other Ambulatory Visit (HOSPITAL_BASED_OUTPATIENT_CLINIC_OR_DEPARTMENT_OTHER): Payer: Self-pay

## 2021-07-09 ENCOUNTER — Other Ambulatory Visit (HOSPITAL_BASED_OUTPATIENT_CLINIC_OR_DEPARTMENT_OTHER): Payer: Self-pay

## 2021-07-17 ENCOUNTER — Other Ambulatory Visit (HOSPITAL_BASED_OUTPATIENT_CLINIC_OR_DEPARTMENT_OTHER): Payer: Self-pay

## 2021-08-20 ENCOUNTER — Encounter (HOSPITAL_COMMUNITY): Payer: Self-pay

## 2021-08-20 ENCOUNTER — Ambulatory Visit (HOSPITAL_COMMUNITY)
Admission: EM | Admit: 2021-08-20 | Discharge: 2021-08-20 | Disposition: A | Discharge status: Home / Self Care | Payer: BLUE CROSS/BLUE SHIELD

## 2021-08-20 DIAGNOSIS — M25561 Pain in right knee: Secondary | ICD-10-CM | POA: Diagnosis not present

## 2021-08-20 DIAGNOSIS — G8929 Other chronic pain: Secondary | ICD-10-CM

## 2021-08-20 DIAGNOSIS — M25562 Pain in left knee: Secondary | ICD-10-CM | POA: Diagnosis not present

## 2021-08-20 MED ORDER — KETOROLAC TROMETHAMINE 30 MG/ML IJ SOLN
INTRAMUSCULAR | Status: AC
  Filled 2021-08-20: qty 1

## 2021-08-20 MED ORDER — KETOROLAC TROMETHAMINE 30 MG/ML IJ SOLN
30.0000 mg | Freq: Once | INTRAMUSCULAR | Status: AC
  Administered 2021-08-20: 30 mg via INTRAMUSCULAR

## 2021-08-20 NOTE — Discharge Instructions (Addendum)
Do not take more of the etodolac pills than prescribed.   Contact one of the rheumatology offices below and see if they will see you without a referral direct from Dr. Criss Rosales.

## 2021-08-20 NOTE — ED Provider Notes (Signed)
[place]    CSN: [id number] Arrival date & time: 08/20/21  1602      History   Chief Complaint Chief Complaint  Patient presents with   Joint Pain    HPI Carrie Neal is a 51 y.o. female. Pt reports long-term pain in her B knees, elbows and hands associated with inflammation/swelling and warmth. HAnds are so painful she can't open jars with them. She reports her physician diagnosed her with a kind of arthritis by bloodwork, she thinks it might be RA. Her PCP was supposed to refer her to a specialist, she thinks rheumatology, for her arthritis but has not done so yet. She's been taking Lodine '400mg'$  up to 8 times a day for no relief (was prescribed for BID) but says she's stopped taking it because it doesn't help at all. She is frustrated with poor communication and help from her PCP's office and is looking to change PCPs, asks for help doing this.   Denies change in sx, only here because Lodine is not helping and she needs help getting to specialist.   HPI  Past Medical History:  Diagnosis Date   Anemia    Asthma    no inhaler   Chicken pox    Diastolic dysfunction    a. 03/2014 Echo: EF 65-70%, gr1 DD, no rwma.   Dysfunctional uterine bleeding    a. 11/2015 s/p partial hysterectomy.   Hypertension    Kidney stones    Morbid obesity ([place])    Type 2 diabetes mellitus ([place])    a. Dx 2016.    Patient Active Problem List   Diagnosis Date Noted   Status post vaginal hysterectomy 11/18/2015   Seasonal allergies 05/21/2014   Type 2 diabetes mellitus ([place]) 04/27/2014   DKA (diabetic ketoacidoses) 04/13/2014   Hypercalcemia 04/13/2014   DUB (dysfunctional uterine bleeding) 03/22/2014   Anemia 03/22/2014    Past Surgical History:  Procedure Laterality Date   ABDOMINAL HYSTERECTOMY     CHOLECYSTECTOMY     CYSTOSCOPY N/A 11/18/2015   Procedure: CYSTOSCOPY;  Surgeon: Waymon Amato, MD;  Location: [facility];  Service: Gynecology;  Laterality: N/A;   DILATATION &  CURETTAGE/HYSTEROSCOPY WITH MYOSURE N/A 03/30/2014   Procedure: DILATATION & CURETTAGE/HYSTEROSCOPY WITH MYOSURE and placement of Mirena Intrauterine device;  Surgeon: Alinda Dooms, MD;  Location: [facility];  Service: Gynecology;  Laterality: N/A;   HERNIA REPAIR     TUBAL LIGATION     UMBILICAL HERNIA REPAIR     as child   VAGINAL HYSTERECTOMY Bilateral 11/18/2015   Procedure: HYSTERECTOMY VAGINAL Rightl Salpingectomy;  Surgeon: Waymon Amato, MD;  Location: [facility];  Service: Gynecology;  Laterality: Bilateral;    OB History     Gravida  6   Para  6   Term  6   Preterm      AB  0   Living  6      SAB  0   IAB      Ectopic      Multiple      Live Births               Home Medications    Prior to Admission medications   Medication Sig Start Date End Date Taking? Authorizing Provider  allopurinol (ZYLOPRIM) 300 MG tablet Take 1 tablet (300 mg total) by mouth daily. 05/28/21   Barrett Henle, MD  Empagliflozin-metFORMIN HCl ER (SYNJARDY XR) 06-998 MG TB24 Take by mouth daily.    [provider]  etodolac (LODINE) 400 MG tablet Take 400 mg by mouth 2 (two) times daily. 07/17/21   [provider]  GLYXAMBI 25-5 MG TABS Take 1 tablet by mouth every morning. 04/02/21   [provider]  metFORMIN (GLUCOPHAGE) 500 MG tablet Take 2 tablets (1,000 mg total) by mouth 2 (two) times daily with a meal. 10/19/18   Marrian Salvage, FNP  Semaglutide, 2 MG/DOSE, (OZEMPIC, 2 MG/DOSE,) 8 MG/3ML SOPN Inject 2 mg intramuscularly once a week 04/02/21     vitamin B-12 (CYANOCOBALAMIN) 1000 MCG tablet Take 500 mcg by mouth daily.    [provider]    Family History Family History  Problem Relation Age of Onset   Diabetes Mother    Diabetes Cousin    Diabetes Maternal Grandmother     Social History Social History   Tobacco Use   Smoking status: Former    Years: 4.00    Types: Cigarettes   Smokeless tobacco: Never  Substance Use Topics    Alcohol use: No   Drug use: No     Allergies   Codeine   Review of Systems Review of Systems   Physical Exam Triage Vital Signs ED Triage Vitals  Enc Vitals Group     BP 08/20/21 1639 (!) 136/91     Pulse Rate 08/20/21 1639 89     Resp 08/20/21 1639 18     Temp 08/20/21 1639 (!) 97.3 F (36.3 C)     Temp Source 08/20/21 1639 Oral     SpO2 08/20/21 1639 99 %     Weight --      Height --      Head Circumference --      Peak Flow --      Pain Score 08/20/21 1640 10     Pain Loc --      Pain Edu? --      Excl. in [place]? --    No data found.  Updated Vital Signs BP (!) 136/91 (BP Location: Right Arm)   Pulse 89   Temp (!) 97.3 F (36.3 C) (Oral)   Resp 18   LMP  (LMP Unknown) Comment: AUB  SpO2 99%   Visual Acuity Right Eye Distance:   Left Eye Distance:   Bilateral Distance:    Right Eye Near:   Left Eye Near:    Bilateral Near:     Physical Exam Constitutional:      General: She is not in acute distress.    Appearance: Normal appearance.  Pulmonary:     Effort: Pulmonary effort is normal.  Musculoskeletal:     Right hand: Swelling, tenderness and bony tenderness present.     Left hand: Swelling, tenderness and bony tenderness present.  Neurological:     Mental Status: She is alert.      UC Treatments / Results  Labs (all labs ordered are listed, but only abnormal results are displayed) Labs Reviewed - No data to display  EKG   Radiology No results found.  Procedures Procedures (including critical care time)  Medications Ordered in UC Medications  ketorolac (TORADOL) 30 MG/ML injection 30 mg (has no administration in time range)    Initial Impression / Assessment and Plan / UC Course  I have reviewed the triage vital signs and the nursing notes.  Pertinent labs & imaging results that were available during my care of the patient were reviewed by me and considered in my medical decision making (see chart for  details).    Based on sx  and distribution of impacted joints, I think it;s likely pt does have RA. I shared how to get PCP at [facility] using open scheduling. I shared contact info for a couple of [place] rheumatology clinics. Discussed if does have RA, DMARDs should help her but that they are managed by rheumatology.   As pt has stopped Lodine, given ketorolac '30mg'$  IM here as requested by pt.   Final Clinical Impressions(s) / UC Diagnoses   Final diagnoses:  Chronic pain of both knees     Discharge Instructions      Do not take more of the etodolac pills than prescribed.   Contact one of the rheumatology offices below and see if they will see you without a referral direct from Dr. Criss Rosales.    ED Prescriptions   None    PDMP not reviewed this encounter.   Carvel Getting, NP 08/20/21 1725

## 2021-08-20 NOTE — ED Triage Notes (Addendum)
Pt c/o arthritis flare up to bilateral elbows, feet, and knees x3 months and prescribed etodolac '400mg'$  BID and took her off her Gabapentin. Pt states pain has worsen in past 4 wks, states hard to get out of bed in the morning. States her next PCP appt not until 9/03. state

## 2021-08-29 IMAGING — CR DG KNEE COMPLETE 4+V*L*
4 series · 4 of 4 positions shown · non-contrast
Comparison: Radiographs 12/18/2019.

CLINICAL DATA: Chronic anterior left knee pain.  No known injury.

EXAM:
LEFT KNEE - COMPLETE 4+ VIEW

[t knee ap left (1 of 2)]
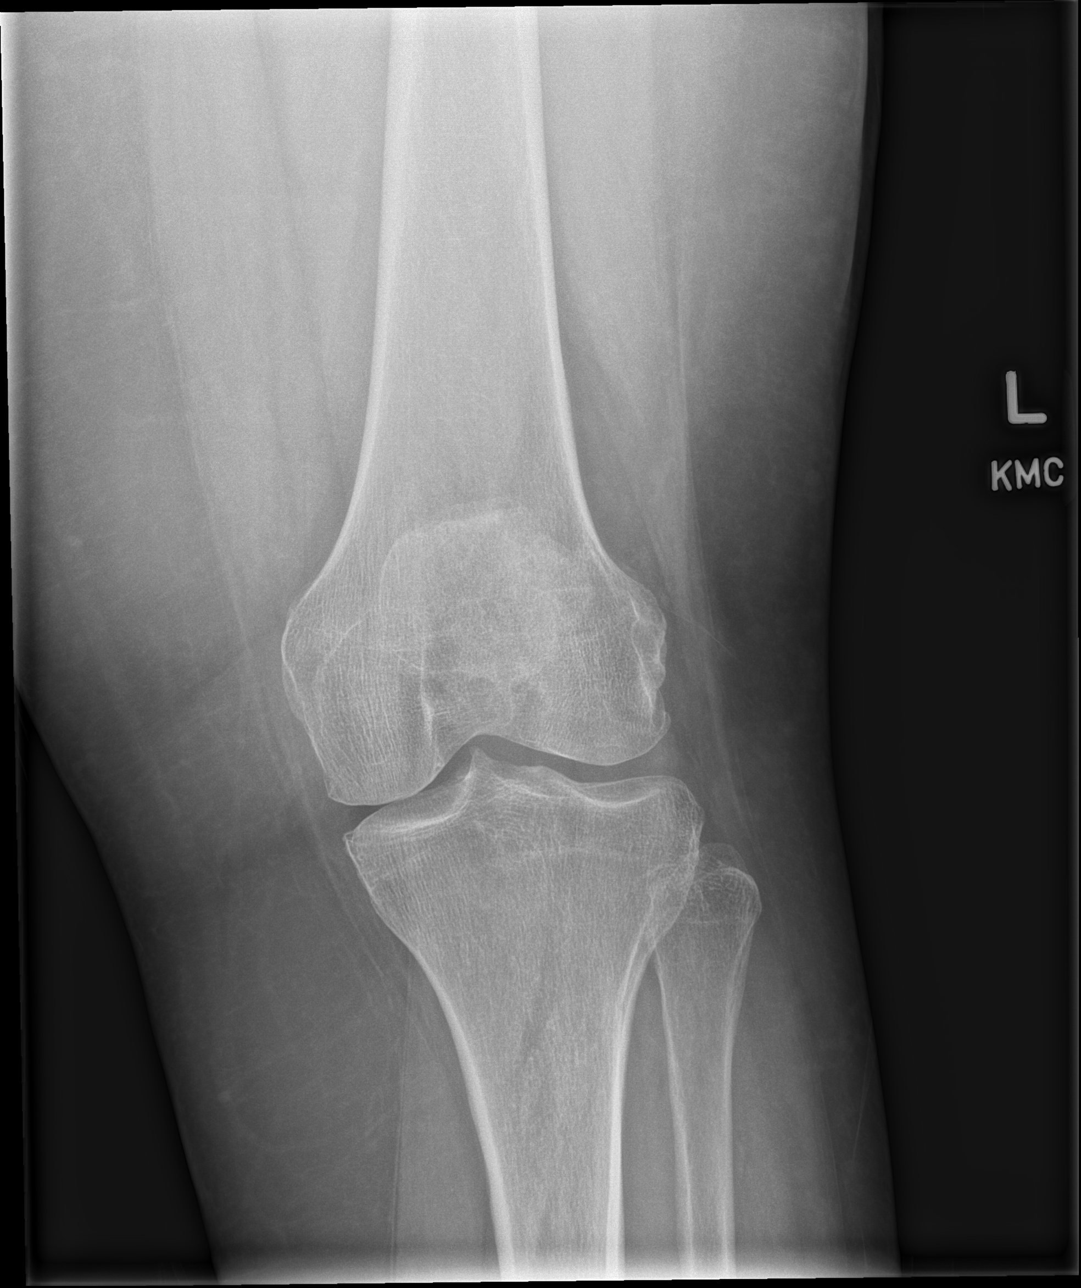

[t knee ap left (2 of 2)]
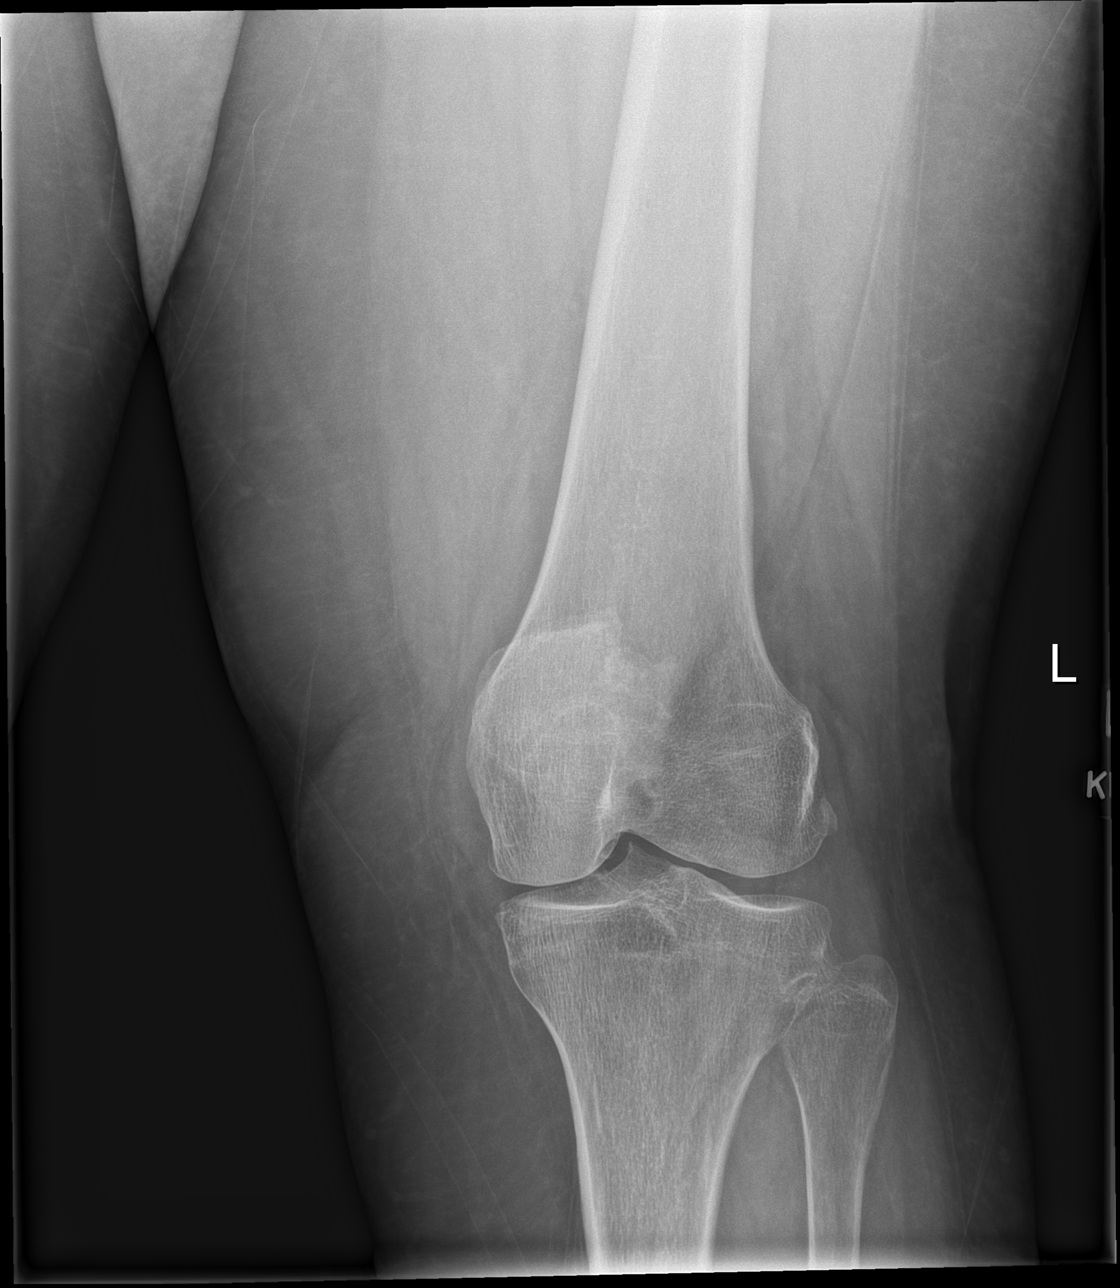

[t knee obl left (1 of 2)]
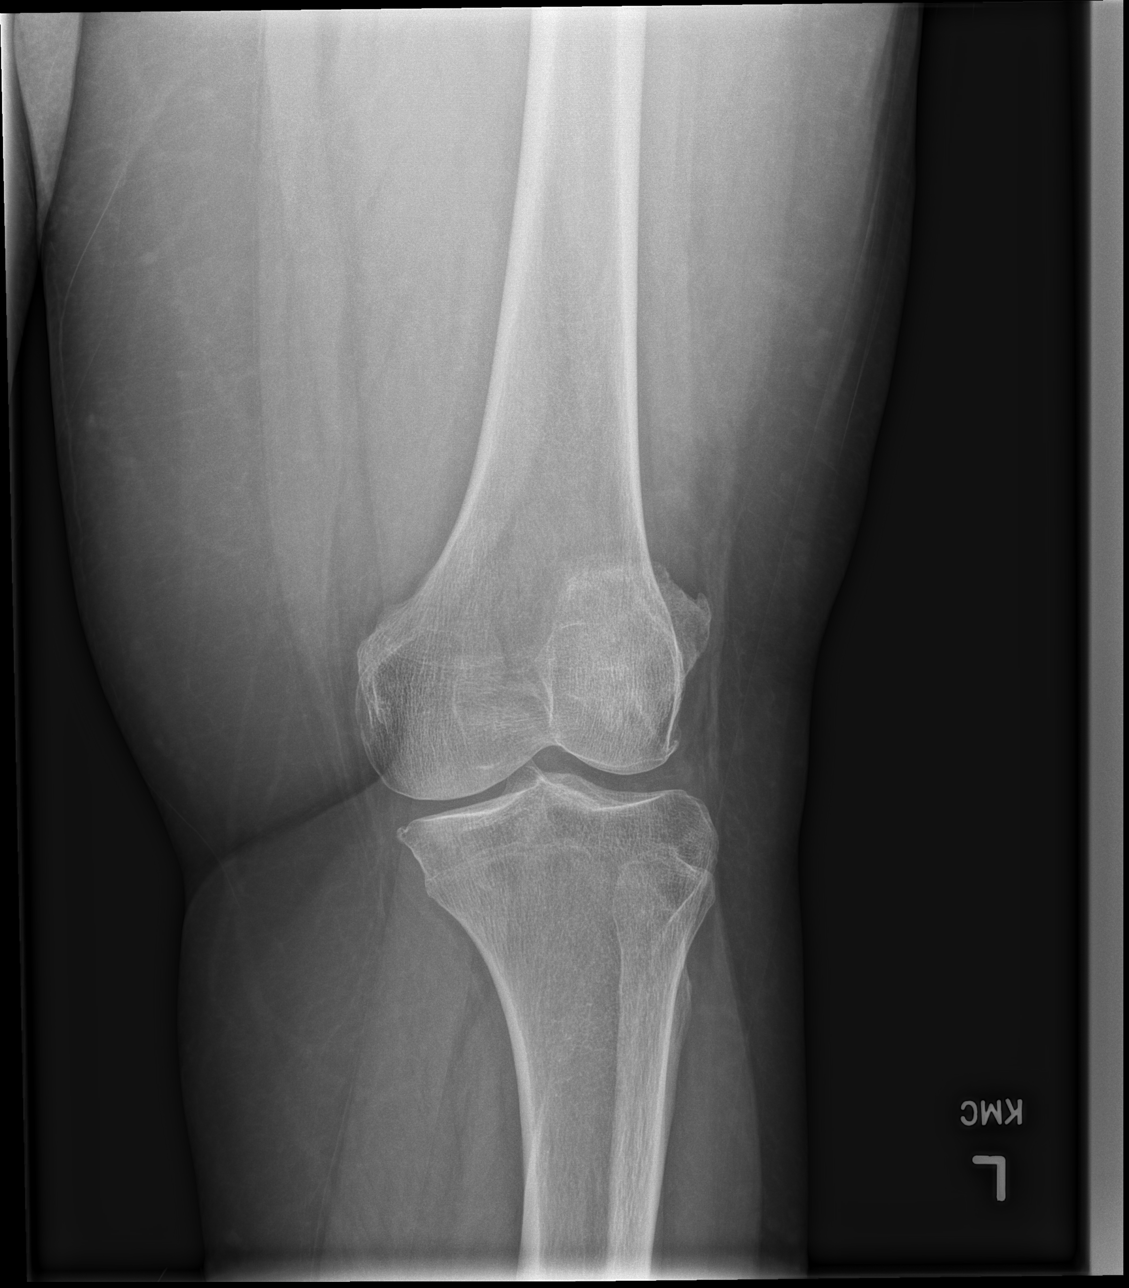

[t knee obl left (2 of 2)]
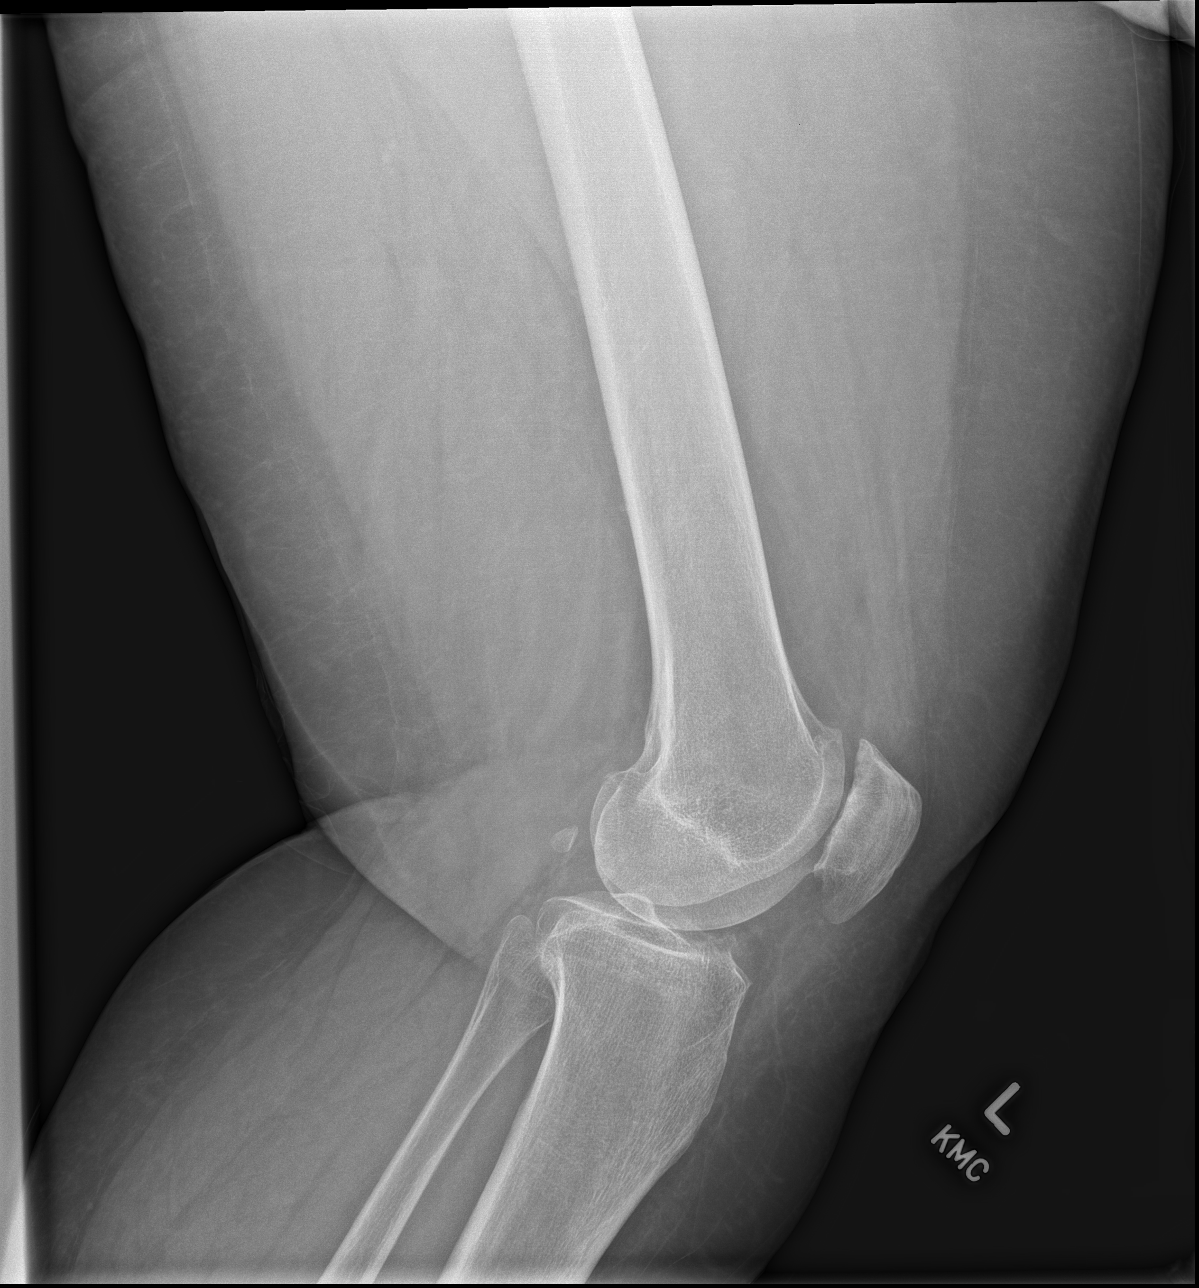

[4 of 4 positions shown; findings below may reference images not displayed]

FINDINGS: The mineralization and alignment are normal. There is no evidence of
acute fracture or dislocation. There are stable tricompartmental
degenerative changes which are most advanced within the
patellofemoral compartment. No joint effusion or focal soft tissue
abnormality identified.
IMPRESSION: Stable tricompartmental degenerative changes. No acute osseous
findings.

## 2022-01-15 ENCOUNTER — Other Ambulatory Visit (HOSPITAL_BASED_OUTPATIENT_CLINIC_OR_DEPARTMENT_OTHER): Payer: Self-pay

## 2022-01-15 MED ORDER — OZEMPIC (2 MG/DOSE) 8 MG/3ML ~~LOC~~ SOPN
PEN_INJECTOR | SUBCUTANEOUS | 0 refills | Status: DC
  Filled 2022-01-15: qty 3, 28d supply, fill #0

## 2022-02-03 ENCOUNTER — Encounter (HOSPITAL_COMMUNITY): Payer: Self-pay | Admitting: Emergency Medicine

## 2022-02-03 ENCOUNTER — Ambulatory Visit (HOSPITAL_COMMUNITY)
Admission: EM | Admit: 2022-02-03 | Discharge: 2022-02-03 | Disposition: A | Discharge status: Home / Self Care | Payer: BLUE CROSS/BLUE SHIELD | Attending: Family Medicine | Admitting: Family Medicine

## 2022-02-03 DIAGNOSIS — R42 Dizziness and giddiness: Secondary | ICD-10-CM | POA: Diagnosis not present

## 2022-02-03 MED ORDER — MECLIZINE HCL 25 MG PO TABS: 25.0000 mg | ORAL_TABLET | Freq: Three times a day (TID) | ORAL | 0 refills | Status: AC | PRN

## 2022-02-03 NOTE — ED Provider Notes (Signed)
[facility]    CSN: [id number] Arrival date & time: 02/03/22  1237      History   Chief Complaint Chief Complaint  Patient presents with   Dizziness    HPI Carrie Neal is a 51 y.o. female.    Dizziness  Here for spinning dizziness that began yesterday.  Is exacerbated by turning her head and when she tries to walk she has difficulty not falling.  She has not fallen so far nor has she hit her head.  No fever and no congestion.  No ear pain.  She does have diabetes and sugars have been improved with the 125 yesterday and another good number today.  Blood pressure is also been good at home.  Past Medical History:  Diagnosis Date   Anemia    Asthma    no inhaler   Chicken pox    Diastolic dysfunction    a. 03/2014 Echo: EF 65-70%, gr1 DD, no rwma.   Dysfunctional uterine bleeding    a. 11/2015 s/p partial hysterectomy.   Hypertension    Kidney stones    Morbid obesity ([place])    Type 2 diabetes mellitus ([place])    a. Dx 2016.    Patient Active Problem List   Diagnosis Date Noted   Status post vaginal hysterectomy 11/18/2015   Seasonal allergies 05/21/2014   Type 2 diabetes mellitus ([place]) 04/27/2014   DKA (diabetic ketoacidoses) 04/13/2014   Hypercalcemia 04/13/2014   DUB (dysfunctional uterine bleeding) 03/22/2014   Anemia 03/22/2014    Past Surgical History:  Procedure Laterality Date   ABDOMINAL HYSTERECTOMY     CHOLECYSTECTOMY     CYSTOSCOPY N/A 11/18/2015   Procedure: CYSTOSCOPY;  Surgeon: Waymon Amato, MD;  Location: [facility];  Service: Gynecology;  Laterality: N/A;   DILATATION & CURETTAGE/HYSTEROSCOPY WITH MYOSURE N/A 03/30/2014   Procedure: DILATATION & CURETTAGE/HYSTEROSCOPY WITH MYOSURE and placement of Mirena Intrauterine device;  Surgeon: Alinda Dooms, MD;  Location: [facility];  Service: Gynecology;  Laterality: N/A;   HERNIA REPAIR     TUBAL LIGATION     UMBILICAL HERNIA REPAIR     as child   VAGINAL HYSTERECTOMY Bilateral 11/18/2015    Procedure: HYSTERECTOMY VAGINAL Rightl Salpingectomy;  Surgeon: Waymon Amato, MD;  Location: [facility];  Service: Gynecology;  Laterality: Bilateral;    OB History     Gravida  6   Para  6   Term  6   Preterm      AB  0   Living  6      SAB  0   IAB      Ectopic      Multiple      Live Births               Home Medications    Prior to Admission medications   Medication Sig Start Date End Date Taking? Authorizing Provider  meclizine (ANTIVERT) 25 MG tablet Take 1 tablet (25 mg total) by mouth 3 (three) times daily as needed for dizziness (vertigo). 02/03/22  Yes Barrett Henle, MD  allopurinol (ZYLOPRIM) 300 MG tablet Take 1 tablet (300 mg total) by mouth daily. 05/28/21   Barrett Henle, MD  Empagliflozin-metFORMIN HCl ER (SYNJARDY XR) 06-998 MG TB24 Take by mouth daily.    [provider]  etodolac (LODINE) 400 MG tablet Take 400 mg by mouth 2 (two) times daily. 07/17/21   [provider]  GLYXAMBI 25-5 MG TABS Take 1 tablet by mouth  every morning. 04/02/21   [provider]  metFORMIN (GLUCOPHAGE) 500 MG tablet Take 2 tablets (1,000 mg total) by mouth 2 (two) times daily with a meal. 10/19/18   Marrian Salvage, FNP  Semaglutide, 2 MG/DOSE, (OZEMPIC, 2 MG/DOSE,) 8 MG/3ML SOPN Inject 2 mg intramuscularly once a week 04/02/21     Semaglutide, 2 MG/DOSE, (OZEMPIC, 2 MG/DOSE,) 8 MG/3ML SOPN Inject 0.75 mLs (2 mg total) into the skin once a week. 01/15/22     vitamin B-12 (CYANOCOBALAMIN) 1000 MCG tablet Take 500 mcg by mouth daily.    [provider]    Family History Family History  Problem Relation Age of Onset   Diabetes Mother    Diabetes Cousin    Diabetes Maternal Grandmother     Social History Social History   Tobacco Use   Smoking status: Former    Years: 4.00    Types: Cigarettes   Smokeless tobacco: Never  Substance Use Topics   Alcohol use: No   Drug use: No     Allergies   Codeine   Review of  Systems Review of Systems  Neurological:  Positive for dizziness.     Physical Exam Triage Vital Signs ED Triage Vitals  Enc Vitals Group     BP 02/03/22 1705 119/82     Pulse Rate 02/03/22 1705 95     Resp 02/03/22 1705 18     Temp 02/03/22 1705 97.9 F (36.6 C)     Temp Source 02/03/22 1705 Oral     SpO2 02/03/22 1705 96 %     Weight --      Height --      Head Circumference --      Peak Flow --      Pain Score 02/03/22 1704 5     Pain Loc --      Pain Edu? --      Excl. in [place]? --    No data found.  Updated Vital Signs BP 119/82 (BP Location: Right Arm)   Pulse 95   Temp 97.9 F (36.6 C) (Oral)   Resp 18   LMP  (LMP Unknown) Comment: AUB  SpO2 96%   Visual Acuity Right Eye Distance:   Left Eye Distance:   Bilateral Distance:    Right Eye Near:   Left Eye Near:    Bilateral Near:     Physical Exam Vitals reviewed.  Constitutional:      General: She is not in acute distress.    Appearance: She is not toxic-appearing.  HENT:     Right Ear: Tympanic membrane and ear canal normal.     Left Ear: Tympanic membrane and ear canal normal.     Nose: Nose normal.     Mouth/Throat:     Mouth: Mucous membranes are moist.     Pharynx: No oropharyngeal exudate or posterior oropharyngeal erythema.  Eyes:     Conjunctiva/sclera: Conjunctivae normal.     Pupils: Pupils are equal, round, and reactive to light.     Comments: There is nystagmus  Cardiovascular:     Rate and Rhythm: Normal rate and regular rhythm.     Heart sounds: No murmur heard. Pulmonary:     Effort: Pulmonary effort is normal. No respiratory distress.     Breath sounds: No stridor. No wheezing, rhonchi or rales.  Musculoskeletal:     Cervical back: Neck supple.  Lymphadenopathy:     Cervical: No cervical adenopathy.  Skin:  Capillary Refill: Capillary refill takes less than 2 seconds.     Coloration: Skin is not jaundiced or pale.  Neurological:     General: No focal deficit present.      Mental Status: She is alert and oriented to person, place, and time.  Psychiatric:        Behavior: Behavior normal.      UC Treatments / Results  Labs (all labs ordered are listed, but only abnormal results are displayed) Labs Reviewed - No data to display  EKG   Radiology No results found.  Procedures Procedures (including critical care time)  Medications Ordered in UC Medications - No data to display  Initial Impression / Assessment and Plan / UC Course  I have reviewed the triage vital signs and the nursing notes.  Pertinent labs & imaging results that were available during my care of the patient were reviewed by me and considered in my medical decision making (see chart for details).        Meclizine is sent to treat the vertigo symptoms and she is given instructions on the Epley maneuver's.  She will follow-up with her primary care.  If her symptoms are persistent and not relieved at all by the medication, I have asked her to please present to the emergency room for further evaluation Final Clinical Impressions(s) / UC Diagnoses   Final diagnoses:  Vertigo     Discharge Instructions      Take meclizine 25 mg--1 tablet every 8 hours as needed for vertigo  Rest and drink plenty of fluids.  If your symptoms are not improved at all with the treatment prescribed, please go to the emergency room for further evaluation there  Also please call your primary care and make a follow-up appointment with them about the symptoms     ED Prescriptions     Medication Sig Dispense Auth. Provider   meclizine (ANTIVERT) 25 MG tablet Take 1 tablet (25 mg total) by mouth 3 (three) times daily as needed for dizziness (vertigo). 30 tablet Shambria Camerer, Gwenlyn Perking, MD      PDMP not reviewed this encounter.   Barrett Henle, MD 02/03/22 1723

## 2022-02-03 NOTE — ED Triage Notes (Signed)
Pt reports dizziness x 2 days. States the room feels like it's spinning. States she has a mild headache that just started while waiting in the lobby. Denies hx of vertigo.

## 2022-02-03 NOTE — Discharge Instructions (Signed)
Take meclizine 25 mg--1 tablet every 8 hours as needed for vertigo  Rest and drink plenty of fluids.  If your symptoms are not improved at all with the treatment prescribed, please go to the emergency room for further evaluation there  Also please call your primary care and make a follow-up appointment with them about the symptoms

## 2022-02-18 ENCOUNTER — Other Ambulatory Visit (HOSPITAL_BASED_OUTPATIENT_CLINIC_OR_DEPARTMENT_OTHER): Payer: Self-pay

## 2022-02-18 MED ORDER — OZEMPIC (2 MG/DOSE) 8 MG/3ML ~~LOC~~ SOPN
2.0000 mg | PEN_INJECTOR | SUBCUTANEOUS | 0 refills | Status: DC
  Filled 2022-02-18: qty 6, 56d supply, fill #0

## 2022-02-27 ENCOUNTER — Other Ambulatory Visit (HOSPITAL_BASED_OUTPATIENT_CLINIC_OR_DEPARTMENT_OTHER): Payer: Self-pay

## 2022-04-03 ENCOUNTER — Other Ambulatory Visit (HOSPITAL_BASED_OUTPATIENT_CLINIC_OR_DEPARTMENT_OTHER): Payer: Self-pay

## 2022-04-03 MED ORDER — METFORMIN HCL 500 MG PO TABS
500.0000 mg | ORAL_TABLET | Freq: Every day | ORAL | 0 refills | Status: DC
  Filled 2022-04-03 – 2022-04-11 (×2): qty 90, 90d supply, fill #0

## 2022-04-03 MED ORDER — LISINOPRIL 2.5 MG PO TABS
2.5000 mg | ORAL_TABLET | Freq: Every day | ORAL | 1 refills | Status: AC
  Filled 2022-04-03 – 2022-04-11 (×2): qty 90, 90d supply, fill #0

## 2022-04-03 MED ORDER — SYNJARDY XR 25-1000 MG PO TB24
1.0000 | ORAL_TABLET | Freq: Every day | ORAL | 3 refills | Status: AC
  Filled 2022-04-03: qty 90, 90d supply, fill #0

## 2022-04-03 MED ORDER — OZEMPIC (2 MG/DOSE) 8 MG/3ML ~~LOC~~ SOPN
2.0000 mg | PEN_INJECTOR | SUBCUTANEOUS | 0 refills | Status: DC
  Filled 2022-04-03 – 2022-05-20 (×2): qty 6, 56d supply, fill #0

## 2022-04-09 ENCOUNTER — Other Ambulatory Visit (HOSPITAL_BASED_OUTPATIENT_CLINIC_OR_DEPARTMENT_OTHER): Payer: Self-pay

## 2022-04-10 ENCOUNTER — Other Ambulatory Visit (HOSPITAL_BASED_OUTPATIENT_CLINIC_OR_DEPARTMENT_OTHER): Payer: Self-pay

## 2022-04-11 ENCOUNTER — Other Ambulatory Visit (HOSPITAL_BASED_OUTPATIENT_CLINIC_OR_DEPARTMENT_OTHER): Payer: Self-pay

## 2022-04-14 ENCOUNTER — Other Ambulatory Visit (HOSPITAL_BASED_OUTPATIENT_CLINIC_OR_DEPARTMENT_OTHER): Payer: Self-pay

## 2022-04-14 MED ORDER — METFORMIN HCL 500 MG PO TABS: 500.0000 mg | ORAL_TABLET | Freq: Every day | ORAL | 0 refills | Status: AC

## 2022-05-20 ENCOUNTER — Other Ambulatory Visit (HOSPITAL_BASED_OUTPATIENT_CLINIC_OR_DEPARTMENT_OTHER): Payer: Self-pay

## 2022-05-21 ENCOUNTER — Other Ambulatory Visit (HOSPITAL_BASED_OUTPATIENT_CLINIC_OR_DEPARTMENT_OTHER): Payer: Self-pay

## 2022-06-18 ENCOUNTER — Other Ambulatory Visit (HOSPITAL_BASED_OUTPATIENT_CLINIC_OR_DEPARTMENT_OTHER): Payer: Self-pay

## 2022-06-18 MED ORDER — OZEMPIC (2 MG/DOSE) 8 MG/3ML ~~LOC~~ SOPN
2.0000 mg | PEN_INJECTOR | SUBCUTANEOUS | 0 refills | Status: DC
  Filled 2022-06-18: qty 6, 56d supply, fill #0

## 2022-07-20 ENCOUNTER — Other Ambulatory Visit (HOSPITAL_BASED_OUTPATIENT_CLINIC_OR_DEPARTMENT_OTHER): Payer: Self-pay

## 2022-09-03 IMAGING — DX DG SHOULDER 2+V*L*
2 series · 2 of 2 positions shown · non-contrast
Comparison: None.

CLINICAL DATA: Fall, left shoulder pain

EXAM:
LEFT SHOULDER - 2+ VIEW

[shoulder ap]
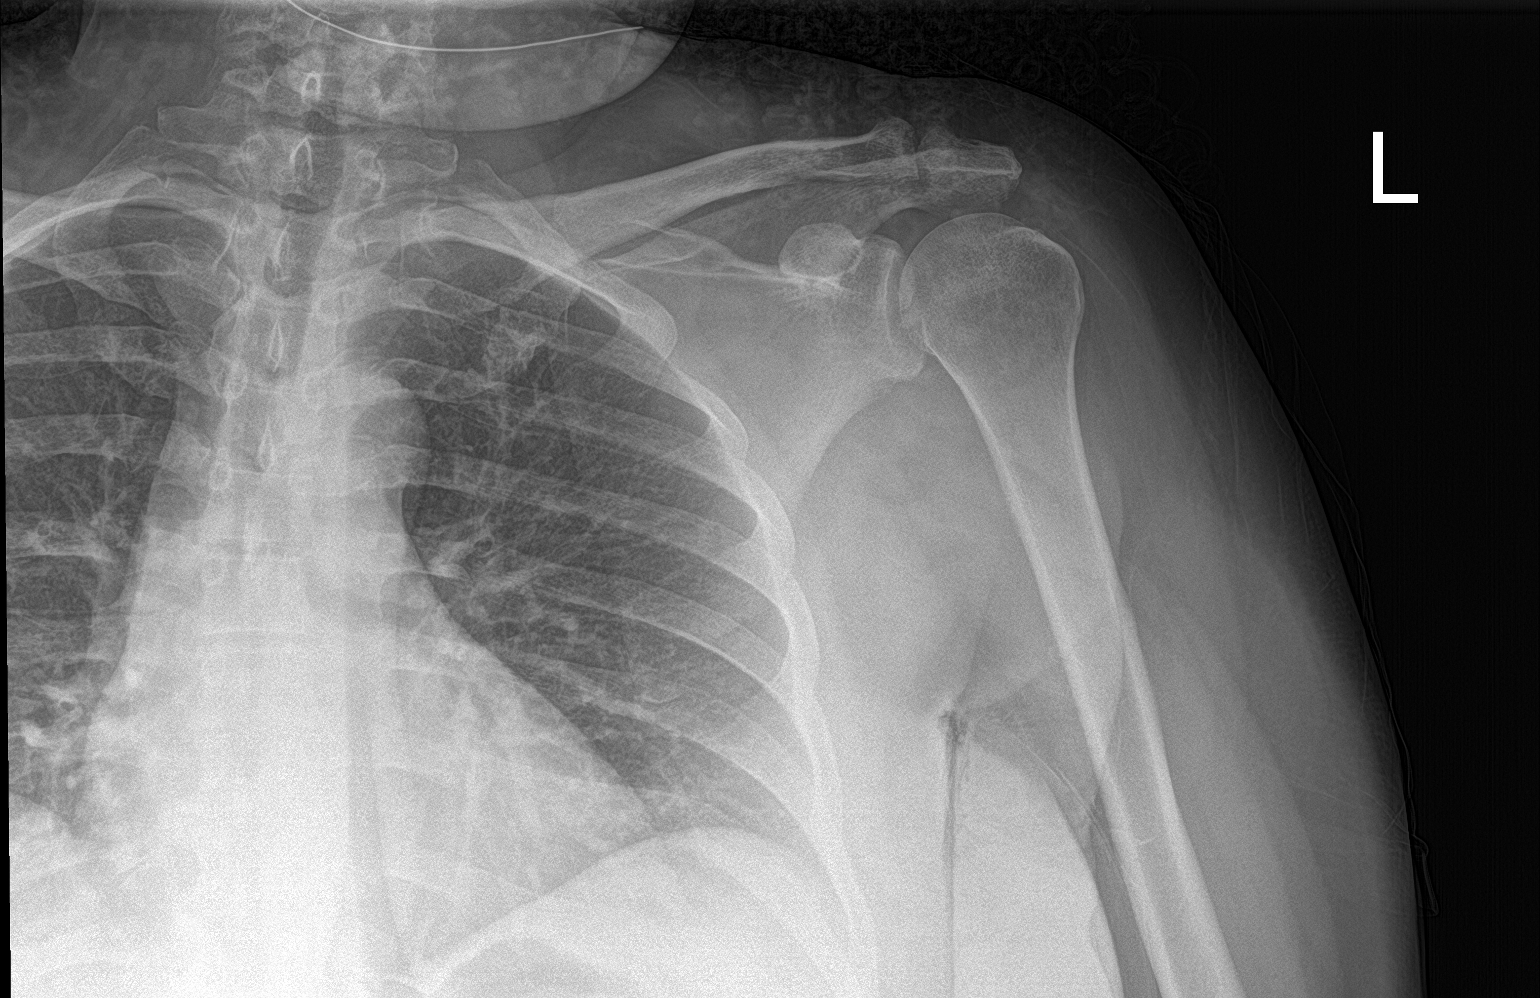

[shoulder y-view]
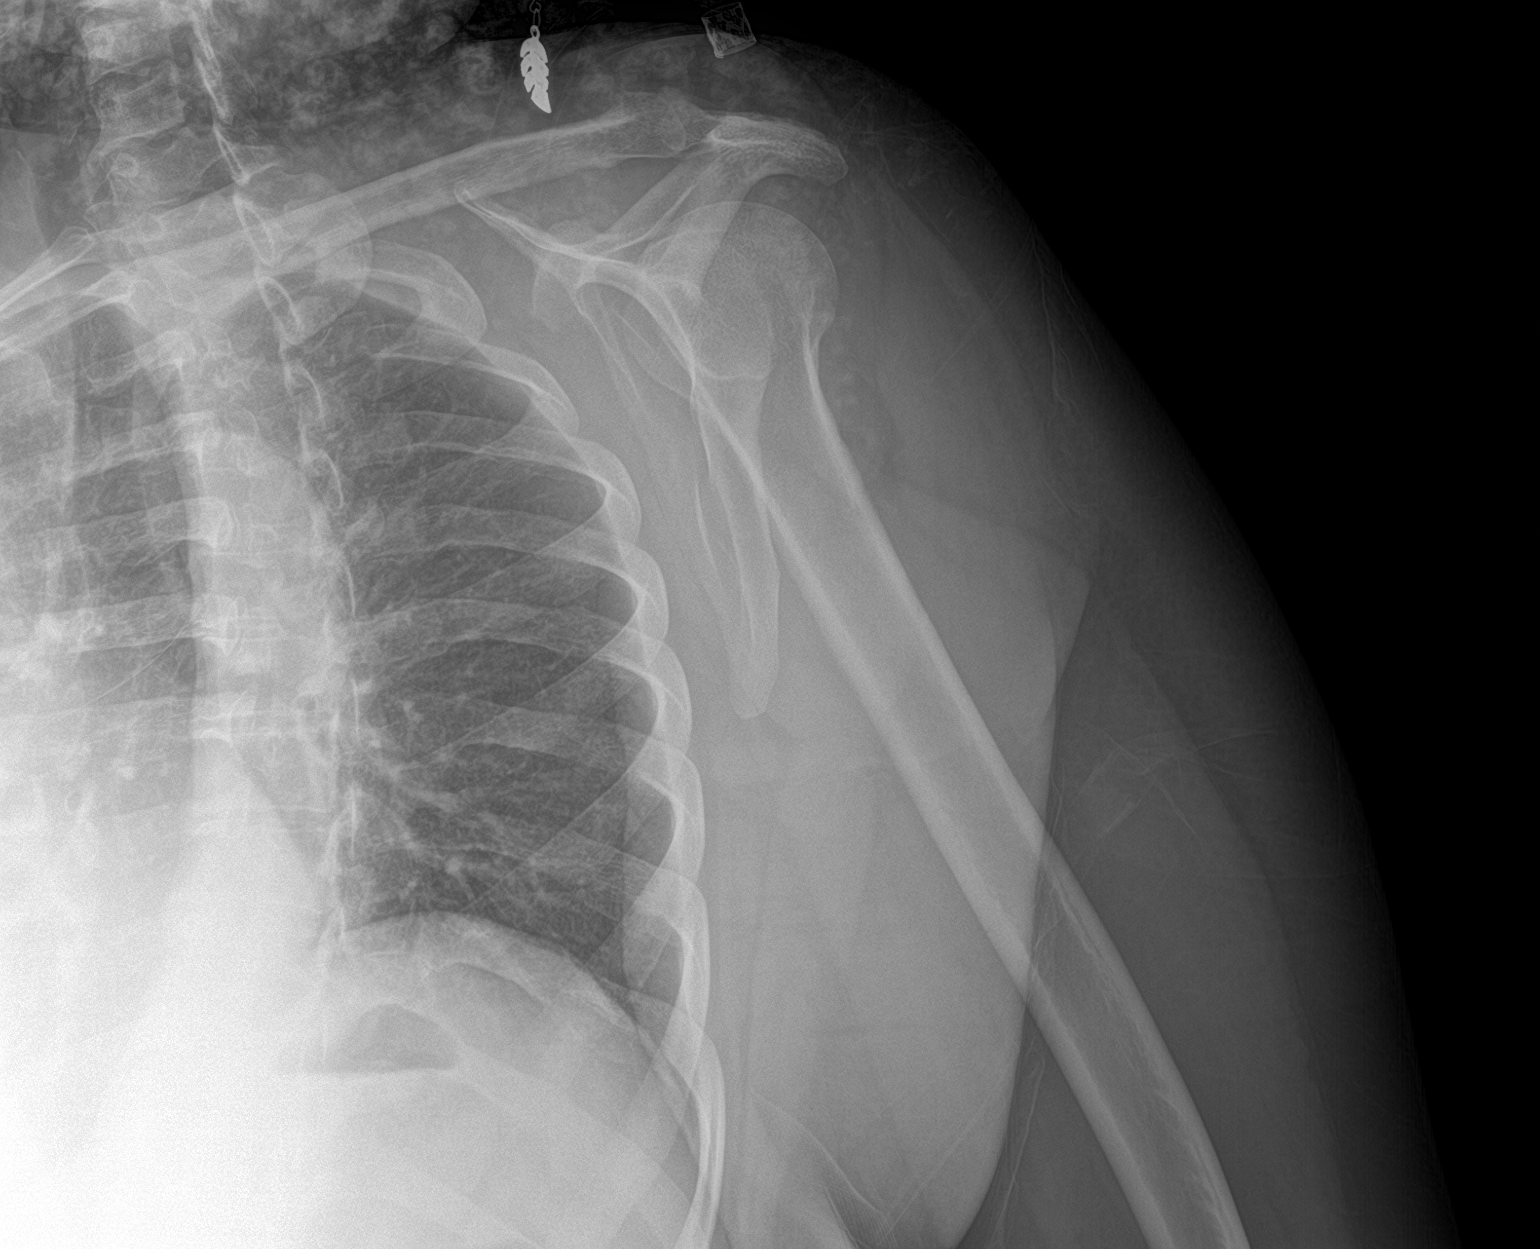

[2 of 2 positions shown; findings below may reference images not displayed]

FINDINGS: Degenerative changes in the AC joint with joint space narrowing and
spurring. Glenohumeral joint is maintained. No acute bony
abnormality. Specifically, no fracture, subluxation, or dislocation.
Soft tissues are intact.
IMPRESSION: Degenerative changes in the left AC joint.

## 2022-11-30 ENCOUNTER — Encounter (HOSPITAL_COMMUNITY): Payer: Self-pay | Admitting: Emergency Medicine

## 2022-11-30 ENCOUNTER — Ambulatory Visit (HOSPITAL_COMMUNITY)
Admission: EM | Admit: 2022-11-30 | Discharge: 2022-11-30 | Disposition: A | Discharge status: Home / Self Care | Payer: BLUE CROSS/BLUE SHIELD | Attending: Internal Medicine | Admitting: Internal Medicine

## 2022-11-30 DIAGNOSIS — L089 Local infection of the skin and subcutaneous tissue, unspecified: Secondary | ICD-10-CM

## 2022-11-30 DIAGNOSIS — L03116 Cellulitis of left lower limb: Secondary | ICD-10-CM

## 2022-11-30 DIAGNOSIS — T148XXA Other injury of unspecified body region, initial encounter: Secondary | ICD-10-CM | POA: Diagnosis not present

## 2022-11-30 MED ORDER — MUPIROCIN 2 % EX OINT: 1.0000 | TOPICAL_OINTMENT | Freq: Three times a day (TID) | CUTANEOUS | 0 refills | Status: AC

## 2022-11-30 MED ORDER — FLUCONAZOLE 150 MG PO TABS
150.0000 mg | ORAL_TABLET | ORAL | 0 refills | Status: AC
Start: 2022-11-30 — End: 2022-12-04

## 2022-11-30 MED ORDER — DOXYCYCLINE HYCLATE 100 MG PO CAPS
100.0000 mg | ORAL_CAPSULE | Freq: Two times a day (BID) | ORAL | 0 refills | Status: AC
Start: 2022-11-30 — End: 2022-12-07

## 2022-11-30 NOTE — ED Provider Notes (Signed)
[facility]    CSN: [id number] Arrival date & time: 11/30/22  1613      History   Chief Complaint Chief Complaint  Patient presents with   Wound Check    HPI Carrie Neal is a 52 y.o. female.    Wound Check  Wound left lower extremity, states while on a cruise fell in the shower striking her shin on a piece of metal.  He sustained a laceration that was sutured 19 2024.  States the sutures fell out yesterday and for 2 days she has been having local swelling, redness, burning pain and drainage.  Denies fever, chills, sweats  Past Medical History:  Diagnosis Date   Anemia    Asthma    no inhaler   Chicken pox    Diastolic dysfunction    a. 03/2014 Echo: EF 65-70%, gr1 DD, no rwma.   Dysfunctional uterine bleeding    a. 11/2015 s/p partial hysterectomy.   Hypertension    Kidney stones    Morbid obesity (HCC)    Type 2 diabetes mellitus (HCC)    a. Dx 2016.    Patient Active Problem List   Diagnosis Date Noted   Status post vaginal hysterectomy 11/18/2015   Seasonal allergies 05/21/2014   Type 2 diabetes mellitus (HCC) 04/27/2014   DKA (diabetic ketoacidosis) (HCC) 04/13/2014   Hypercalcemia 04/13/2014   DUB (dysfunctional uterine bleeding) 03/22/2014   Anemia 03/22/2014    Past Surgical History:  Procedure Laterality Date   ABDOMINAL HYSTERECTOMY     CHOLECYSTECTOMY     CYSTOSCOPY N/A 11/18/2015   Procedure: CYSTOSCOPY;  Surgeon: Hoover Browns, MD;  Location: [facility];  Service: Gynecology;  Laterality: N/A;   DILATATION & CURETTAGE/HYSTEROSCOPY WITH MYOSURE N/A 03/30/2014   Procedure: DILATATION & CURETTAGE/HYSTEROSCOPY WITH MYOSURE and placement of Mirena Intrauterine device;  Surgeon: Konrad Felix, MD;  Location: [facility];  Service: Gynecology;  Laterality: N/A;   HERNIA REPAIR     TUBAL LIGATION     UMBILICAL HERNIA REPAIR     as child   VAGINAL HYSTERECTOMY Bilateral 11/18/2015   Procedure: HYSTERECTOMY VAGINAL Rightl Salpingectomy;  Surgeon: Hoover Browns, MD;  Location: [facility];  Service: Gynecology;  Laterality: Bilateral;    OB History     Gravida  6   Para  6   Term  6   Preterm      AB  0   Living  6      SAB  0   IAB      Ectopic      Multiple      Live Births               Home Medications    Prior to Admission medications   Medication Sig Start Date End Date Taking? Authorizing Provider  doxycycline (VIBRAMYCIN) 100 MG capsule Take 1 capsule (100 mg total) by mouth 2 (two) times daily for 7 days. 11/30/22 12/07/22 Yes Meliton Rattan, PA  fluconazole (DIFLUCAN) 150 MG tablet Take 1 tablet (150 mg total) by mouth every 3 (three) days for 4 days. 11/30/22 12/04/22 Yes Meliton Rattan, PA  methotrexate (RHEUMATREX) 2.5 MG tablet Take 2.5 mg by mouth once a week. 04/03/22  Yes [provider]  MOUNJARO 10 MG/0.5ML Pen Inject 10 mg into the skin once a week. 10/22/22  Yes [provider]  mupirocin ointment (BACTROBAN) 2 % Apply 1 Application topically 3 (three) times daily for 10 days. 11/30/22 12/10/22 Yes Meliton Rattan, PA  allopurinol (ZYLOPRIM) 300 MG tablet Take 1 tablet (300 mg total) by mouth daily. 05/28/21   Zenia Resides, MD  Empagliflozin-metFORMIN HCl ER (SYNJARDY XR) 25-1000 MG TB24 Take 1 tablet by mouth daily. 04/03/22     Empagliflozin-metFORMIN HCl ER (SYNJARDY XR) 06-998 MG TB24 Take by mouth daily.    [provider]  etodolac (LODINE) 400 MG tablet Take 400 mg by mouth 2 (two) times daily. 07/17/21   [provider]  GLYXAMBI 25-5 MG TABS Take 1 tablet by mouth every morning. Patient not taking: Reported on 11/30/2022 04/02/21   [provider]  lisinopril (ZESTRIL) 2.5 MG tablet Take 1 tablet (2.5 mg total) by mouth daily. 04/03/22     meclizine (ANTIVERT) 25 MG tablet Take 1 tablet (25 mg total) by mouth 3 (three) times daily as needed for dizziness (vertigo). 02/03/22   Zenia Resides, MD  metFORMIN (GLUCOPHAGE) 500 MG tablet Take 2  tablets (1,000 mg total) by mouth 2 (two) times daily with a meal. 10/19/18   Olive Bass, FNP  metFORMIN (GLUCOPHAGE) 500 MG tablet Take 1 tablet (500 mg total) by mouth daily. 04/14/22     Semaglutide, 2 MG/DOSE, (OZEMPIC, 2 MG/DOSE,) 8 MG/3ML SOPN Inject 2 mg intramuscularly once a week Patient not taking: Reported on 11/30/2022 04/02/21     vitamin B-12 (CYANOCOBALAMIN) 1000 MCG tablet Take 500 mcg by mouth daily.    [provider]    Family History Family History  Problem Relation Age of Onset   Diabetes Mother    Diabetes Cousin    Diabetes Maternal Grandmother     Social History Social History   Tobacco Use   Smoking status: Former    Types: Cigarettes   Smokeless tobacco: Never  Substance Use Topics   Alcohol use: No   Drug use: No     Allergies   Codeine   Review of Systems Review of Systems  Constitutional:  Negative for chills and fever.  Skin:  Positive for color change and wound.     Physical Exam Triage Vital Signs ED Triage Vitals  Encounter Vitals Group     BP 11/30/22 1659 122/82     Systolic BP Percentile --      Diastolic BP Percentile --      Pulse Rate 11/30/22 1659 97     Resp 11/30/22 1659 16     Temp 11/30/22 1659 98.2 F (36.8 C)     Temp Source 11/30/22 1659 Oral     SpO2 11/30/22 1659 97 %     Weight --      Height --      Head Circumference --      Peak Flow --      Pain Score 11/30/22 1656 7     Pain Loc --      Pain Education --      Exclude from Growth Chart --    No data found.  Updated Vital Signs BP 122/82 (BP Location: Right Arm)   Pulse 97   Temp 98.2 F (36.8 C) (Oral)   Resp 16   LMP  (LMP Unknown) Comment: AUB  SpO2 97%   Visual Acuity Right Eye Distance:   Left Eye Distance:   Bilateral Distance:    Right Eye Near:   Left Eye Near:    Bilateral Near:     Physical Exam Constitutional:      Appearance: She is not ill-appearing.  Cardiovascular:     Rate  and Rhythm: Normal rate.   Pulmonary:     Effort: Pulmonary effort is normal.  Musculoskeletal:        General: Normal range of motion.  Skin:    General: Skin is warm and dry.     Findings: No erythema.     Comments: Left lower extremity 8 mm open wound with surrounding mild induration, erythema and tenderness without significant swelling, no active drainage, no foreign body seen or palpated.  Neurological:     Mental Status: She is alert.      UC Treatments / Results  Labs (all labs ordered are listed, but only abnormal results are displayed) Labs Reviewed - No data to display  EKG   Radiology No results found.  Procedures Procedures (including critical care time)  Medications Ordered in UC Medications - No data to display  Initial Impression / Assessment and Plan / UC Course  I have reviewed the triage vital signs and the nursing notes.  Pertinent labs & imaging results that were available during my care of the patient were reviewed by me and considered in my medical decision making (see chart for details).     52 year old non-insulin-dependent diabetic with history of laceration left lower extremity sustained 11/05/2022 while on a cruise, wound was sutured, she did not have sutures removed, states the sutures fell out yesterday and for 2 days she has been having local redness, swelling, drainage, tenderness.  Denies fever or elevated blood sugars. Treat with oral and topical antibiotics, recommend elevation, recheck in 24 to 48 hours, strict ED precautions reviewed Final Clinical Impressions(s) / UC Diagnoses   Final diagnoses:  Infected wound  Cellulitis of left lower extremity     Discharge Instructions      Elevate your leg above the level of the heart Check in 24 to 48 hours Go to the emergency department for significant worsening of symptoms, development of fever, increased swelling, increased pain, flulike symptoms, concerns     ED Prescriptions     Medication Sig Dispense  Auth. Provider   mupirocin ointment (BACTROBAN) 2 % Apply 1 Application topically 3 (three) times daily for 10 days. 22 g Meliton Rattan, PA   fluconazole (DIFLUCAN) 150 MG tablet Take 1 tablet (150 mg total) by mouth every 3 (three) days for 4 days. 2 tablet Meliton Rattan, PA   doxycycline (VIBRAMYCIN) 100 MG capsule Take 1 capsule (100 mg total) by mouth 2 (two) times daily for 7 days. 14 capsule Meliton Rattan, [place]      PDMP not reviewed this encounter.   Meliton Rattan, [place] 11/30/22 1719

## 2022-11-30 NOTE — ED Triage Notes (Signed)
Pt hit left leg about three weeks ago while on cruise. She had wound stitched on cruise. Pt states stiches have came out and she has had yellow discharge and pain at site.

## 2022-11-30 NOTE — Discharge Instructions (Addendum)
Elevate your leg above the level of the heart Check in 24 to 48 hours Go to the emergency department for significant worsening of symptoms, development of fever, increased swelling, increased pain, flulike symptoms, concerns
